# Patient Record
Sex: Female | Born: 1975 | Race: Black or African American | Hispanic: No | Smoking: Former smoker
Health system: Southern US, Community
[De-identification: ages and names within clinical notes are randomized; demographics above are authoritative.]

## PROBLEM LIST (undated history)

## (undated) DIAGNOSIS — D649 Anemia, unspecified: Secondary | ICD-10-CM

## (undated) DIAGNOSIS — I10 Essential (primary) hypertension: Secondary | ICD-10-CM

## (undated) DIAGNOSIS — F419 Anxiety disorder, unspecified: Secondary | ICD-10-CM

## (undated) HISTORY — PX: FRACTURE SURGERY: SHX138

## (undated) HISTORY — DX: Anxiety disorder, unspecified: F41.9

## (undated) HISTORY — PX: ANKLE FRACTURE SURGERY: SHX122

---

## 1998-09-02 ENCOUNTER — Emergency Department (HOSPITAL_COMMUNITY): Admission: EM | Admit: 1998-09-02 | Discharge: 1998-09-02 | Payer: Self-pay | Admitting: Emergency Medicine

## 1998-09-16 ENCOUNTER — Emergency Department (HOSPITAL_COMMUNITY): Admission: EM | Admit: 1998-09-16 | Discharge: 1998-09-16 | Payer: Self-pay | Admitting: Emergency Medicine

## 1999-04-10 ENCOUNTER — Emergency Department (HOSPITAL_COMMUNITY): Admission: EM | Admit: 1999-04-10 | Discharge: 1999-04-10 | Payer: Self-pay | Admitting: Emergency Medicine

## 2000-04-11 ENCOUNTER — Emergency Department (HOSPITAL_COMMUNITY): Admission: EM | Admit: 2000-04-11 | Discharge: 2000-04-11 | Payer: Self-pay | Admitting: Emergency Medicine

## 2000-05-05 ENCOUNTER — Emergency Department (HOSPITAL_COMMUNITY): Admission: EM | Admit: 2000-05-05 | Discharge: 2000-05-05 | Payer: Self-pay | Admitting: *Deleted

## 2002-04-22 ENCOUNTER — Emergency Department (HOSPITAL_COMMUNITY): Admission: EM | Admit: 2002-04-22 | Discharge: 2002-04-22 | Payer: Self-pay | Admitting: Emergency Medicine

## 2002-11-02 ENCOUNTER — Encounter: Payer: Self-pay | Admitting: Specialist

## 2002-11-02 ENCOUNTER — Inpatient Hospital Stay (HOSPITAL_COMMUNITY): Admission: EM | Admit: 2002-11-02 | Discharge: 2002-11-05 | Payer: Self-pay | Admitting: Emergency Medicine

## 2002-11-02 ENCOUNTER — Encounter: Payer: Self-pay | Admitting: Emergency Medicine

## 2002-12-21 ENCOUNTER — Encounter: Admission: RE | Admit: 2002-12-21 | Discharge: 2003-01-11 | Payer: Self-pay | Admitting: *Deleted

## 2003-05-05 ENCOUNTER — Emergency Department (HOSPITAL_COMMUNITY): Admission: EM | Admit: 2003-05-05 | Discharge: 2003-05-06 | Payer: Self-pay | Admitting: Emergency Medicine

## 2003-05-06 ENCOUNTER — Encounter: Payer: Self-pay | Admitting: Emergency Medicine

## 2003-05-12 ENCOUNTER — Emergency Department (HOSPITAL_COMMUNITY): Admission: EM | Admit: 2003-05-12 | Discharge: 2003-05-12 | Payer: Self-pay

## 2003-08-23 ENCOUNTER — Emergency Department (HOSPITAL_COMMUNITY): Admission: EM | Admit: 2003-08-23 | Discharge: 2003-08-24 | Payer: Self-pay | Admitting: Emergency Medicine

## 2004-08-10 ENCOUNTER — Encounter: Admission: RE | Admit: 2004-08-10 | Discharge: 2004-08-10 | Payer: Self-pay | Admitting: Family Medicine

## 2006-04-14 ENCOUNTER — Emergency Department (HOSPITAL_COMMUNITY): Admission: EM | Admit: 2006-04-14 | Discharge: 2006-04-14 | Payer: Self-pay | Admitting: Emergency Medicine

## 2009-06-24 ENCOUNTER — Emergency Department (HOSPITAL_COMMUNITY): Admission: EM | Admit: 2009-06-24 | Discharge: 2009-06-25 | Payer: Self-pay | Admitting: Emergency Medicine

## 2009-09-28 ENCOUNTER — Encounter (HOSPITAL_COMMUNITY): Admission: RE | Admit: 2009-09-28 | Discharge: 2009-12-07 | Payer: Self-pay | Admitting: Internal Medicine

## 2009-10-16 ENCOUNTER — Encounter: Admission: RE | Admit: 2009-10-16 | Discharge: 2009-10-16 | Payer: Self-pay | Admitting: Cardiovascular Disease

## 2010-11-24 ENCOUNTER — Emergency Department (HOSPITAL_COMMUNITY)
Admission: EM | Admit: 2010-11-24 | Discharge: 2010-11-24 | Payer: Self-pay | Source: Home / Self Care | Admitting: Emergency Medicine

## 2011-03-17 LAB — POCT CARDIAC MARKERS
CKMB, poc: 1.5 ng/mL (ref 1.0–8.0)
CKMB, poc: <1 ng/mL — ABNORMAL LOW (ref 1.0–8.0)
Myoglobin, poc: 55.8 ng/mL (ref 12–200)
Troponin i, poc: <0.05 ng/mL (ref 0.00–0.09)

## 2011-03-17 LAB — CBC
MCV: 79 fL (ref 78.0–100.0)
RBC: 4.42 MIL/uL (ref 3.87–5.11)

## 2011-03-17 LAB — RAPID URINE DRUG SCREEN, HOSP PERFORMED
Amphetamines: NOT DETECTED
Barbiturates: NOT DETECTED
Cocaine: NOT DETECTED
Opiates: NOT DETECTED
Tetrahydrocannabinol: NOT DETECTED

## 2011-03-17 LAB — POCT I-STAT, CHEM 8
Calcium, Ion: 1.2 mmol/L (ref 1.12–1.32)
Chloride: 104 mEq/L (ref 96–112)
Glucose, Bld: 108 mg/dL — ABNORMAL HIGH (ref 70–99)
HCT: 36 % (ref 36.0–46.0)
Potassium: 3.3 mEq/L — ABNORMAL LOW (ref 3.5–5.1)
Sodium: 138 mEq/L (ref 135–145)
TCO2: 23 mmol/L (ref 0–100)

## 2011-03-17 LAB — DIFFERENTIAL
Lymphocytes Relative: 40 % (ref 12–46)
Lymphs Abs: 2.4 10*3/uL (ref 0.7–4.0)
Neutro Abs: 3 10*3/uL (ref 1.7–7.7)
Neutrophils Relative %: 49 % (ref 43–77)

## 2011-03-17 LAB — D-DIMER, QUANTITATIVE: D-Dimer, Quant: 0.92 ug/mL-FEU — ABNORMAL HIGH (ref 0.00–0.48)

## 2011-03-17 LAB — POCT PREGNANCY, URINE: Preg Test, Ur: NEGATIVE

## 2011-04-26 NOTE — Discharge Summary (Signed)
NAME:  Anna [name], Anna [name]                           ACCOUNT NO.:  [id number]   MEDICAL RECORD NO.:  [id number]                   PATIENT TYPE:  INP   LOCATION:  0443                                 FACILITY:  [facility]   PHYSICIAN:  Erasmo Leventhal, M.D.         DATE OF BIRTH:  [birth year]   DATE OF ADMISSION:  11/02/2002  DATE OF DISCHARGE:  11/05/2002                                 DISCHARGE SUMMARY   ADMISSION DIAGNOSIS:  Right ankle fracture lateral malleolus with minimal  displacement of the mortise.   DISCHARGE DIAGNOSIS:  Right ankle fracture lateral malleolus with minimal  displacement of the mortise, status post open reduction and internal  fixation of right ankle.   PROCEDURES:  The patient was taken to the operating room on November 02, 2002, and underwent ORIF of her right ankle Weber B displaced lateral  malleolus fracture.  Surgeon was Dr. Benny Lennert.  Assistant was Jenne Campus, P.A.-C.  Surgery was done under general anesthesia.   CONSULTATIONS:  1. Physical therapy.  2. Social work.  3. Case management.   LABORATORY DATA:  Urine pregnancy test was negative.   Preoperative x-rays of the head, CT of the head revealed scalp hematoma  right temporal region.  No intracranial mass or hemorrhage.  No skull  fracture or foreign body.  Preoperative x-rays of the right ankle revealed  good postreduction casting position right ankle.   There is no EKG on this chart.   HOSPITAL COURSE:  The patient was admitted to [facility] on  November 02, 2002, and taken to the operating room.  She underwent the above-  stated procedure without complications.  The patient was allowed to return  to the recovery room and then the orthopedic floor for continued  postoperative care.  On November 03, 2002, seen by orthopedics.  The patient  was complaining of pain.  Pain not adequately controlled by p.o. medicines.  PC was held today.  Seen by PT.  The patient was  touchdown weightbearing.  On November 04, 2002, the patient was seen by orthopedics.  Still  complaining of ankle pain.  Splint intact.  Neurovascularly intact.  Discharge was held at this time due to no one being at home able to help  her.  On November 05, 2002, postoperative day #3, the patient was resting  comfortably and was ready for discharge.  The patient was discharged home.   DISPOSITION:  Discharged home on November 05, 2002.   DISCHARGE MEDICATIONS:  1. Percocet 5/325 #50, 1-2 p.o. q.4-6h. p.r.n.  2. Robaxin 500 mg #50, 1 p.o. q.6-8h. p.r.n.  3. Aspirin 325 mg 1 p.o. q.d.   DIET:  As tolerated.   ACTIVITY:  Touchdown weightbearing to right lower extremity.   FOLLOW-UP:  The patient is to follow up with Dr. Thomasena Edis two weeks from the  day of surgery.  She is to call for  an appointment.   CONDITION ON DISCHARGE:  Stable and improved.     Clarene Reamer, P.A.-C.                   Erasmo Leventhal, M.D.    SW/MEDQ  D:  11/12/2002  T:  11/12/2002  Job:  [id number]
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
## 2011-12-18 ENCOUNTER — Encounter (HOSPITAL_COMMUNITY): Payer: Self-pay | Admitting: Emergency Medicine

## 2011-12-18 ENCOUNTER — Emergency Department (HOSPITAL_COMMUNITY)
Admission: EM | Admit: 2011-12-18 | Discharge: 2011-12-18 | Disposition: A | Discharge status: Home Health | Payer: Self-pay | Attending: Emergency Medicine | Admitting: Emergency Medicine

## 2011-12-18 DIAGNOSIS — T783XXA Angioneurotic edema, initial encounter: Secondary | ICD-10-CM | POA: Insufficient documentation

## 2011-12-18 DIAGNOSIS — X58XXXA Exposure to other specified factors, initial encounter: Secondary | ICD-10-CM | POA: Insufficient documentation

## 2011-12-18 DIAGNOSIS — I10 Essential (primary) hypertension: Secondary | ICD-10-CM | POA: Insufficient documentation

## 2011-12-18 DIAGNOSIS — R221 Localized swelling, mass and lump, neck: Secondary | ICD-10-CM | POA: Insufficient documentation

## 2011-12-18 DIAGNOSIS — R220 Localized swelling, mass and lump, head: Secondary | ICD-10-CM | POA: Insufficient documentation

## 2011-12-18 HISTORY — DX: Essential (primary) hypertension: I10

## 2011-12-18 MED ORDER — PREDNISONE 10 MG PO TABS: 20.0000 mg | ORAL_TABLET | Freq: Every day | ORAL | Status: DC

## 2011-12-18 MED ORDER — DIPHENHYDRAMINE HCL 25 MG PO CAPS
25.0000 mg | ORAL_CAPSULE | Freq: Once | ORAL | Status: AC
  Administered 2011-12-18: 25 mg via ORAL
  Filled 2011-12-18: qty 1

## 2011-12-18 MED ORDER — DIPHENHYDRAMINE HCL 25 MG PO TABS: 50.0000 mg | ORAL_TABLET | Freq: Three times a day (TID) | ORAL | Status: DC | PRN

## 2011-12-18 NOTE — ED Notes (Signed)
Pt states her top lip started to swell about  4 am  States had an icecream sandwich and she had a nut , in the past nuts have made her mouth itch, unk any other reason for swelling pt has no diff breathing or speaking nad

## 2011-12-18 NOTE — ED Provider Notes (Signed)
History     CSN: [id number]  Arrival date & time 12/18/11  1110   First MD Initiated Contact with Patient 12/18/11 1202      Chief Complaint  Patient presents with  . Oral Swelling    (Consider location/radiation/quality/duration/timing/severity/associated sxs/prior treatment) HPI Patient presents to the emergency room with complaints of lip swelling. Patient states she noticed a little bit of irritation yesterday and it got better. Patient woke up this morning and noticed that her upper lip was swollen. She denies any trouble with her breathing or speaking. She denies any rash or urticaria. Patient states she did have an ice cream sandwich yesterday think she had a nut but did not notice an immediate reaction after that. Past Medical History  Diagnosis Date  . Hypertension     No past surgical history on file.  No family history on file.  History  Substance Use Topics  . Smoking status: Current Everyday Smoker  . Smokeless tobacco: Not on file  . Alcohol Use: No    OB History    Grav Para Term Preterm Abortions TAB SAB Ect Mult Living                  Review of Systems  All other systems reviewed and are negative.    Allergies  Latex and Peanut-containing drug products  Home Medications   Current Outpatient Rx  Name Route Sig Dispense Refill  . LISINOPRIL 20 MG PO TABS Oral Take 20 mg by mouth daily.    Marland Kitchen METOPROLOL TARTRATE 50 MG PO TABS Oral Take 75 mg by mouth 2 (two) times daily.    Carma Leaven M PLUS PO TABS Oral Take 1 tablet by mouth daily.      BP 130/86  Pulse 73  Temp(Src) 98.3 F (36.8 C) (Oral)  Resp 16  SpO2 100%  Physical Exam  Nursing note and vitals reviewed. Constitutional: She appears well-developed and well-nourished. No distress.  HENT:  Head: Normocephalic and atraumatic.  Right Ear: External ear normal.  Left Ear: External ear normal.  Mouth/Throat: Oropharynx is clear and moist. No oral lesions. Normal dentition. No uvula  swelling or dental caries. No oropharyngeal exudate or posterior oropharyngeal edema.       No tongue edema, edema noted of the upper lip  Eyes: Conjunctivae are normal. Right eye exhibits no discharge. Left eye exhibits no discharge. No scleral icterus.  Neck: Neck supple. No tracheal deviation present.  Cardiovascular: Normal rate.   Pulmonary/Chest: Effort normal. No stridor. No respiratory distress.  Musculoskeletal: She exhibits no edema.  Neurological: She is alert. Cranial nerve deficit: no gross deficits.  Skin: Skin is warm and dry. No rash noted. Rash is not urticarial.  Psychiatric: She has a normal mood and affect.    ED Course  Procedures (including critical care time)  Labs Reviewed - No data to display No results found.   Diagnosis: Angioedema   MDM  The patient's symptoms are suggestive of angioedema associated with her lisinopril. Patient's symptoms started gradually and she noticed it primarily this morning. There is no clear relation to any food ingestion. There is no rash. She is not itching. 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 I will have her stop her lisinopril and have her speak to her primary doctor about the different blood pressure medication because of the fact that she thought she ate some nuts yesterday I will prescribe her antihistamines and  steroids although it will not likely help if this is from the lisinopril.        Celene Kras, MD 12/18/11 [phone]

## 2012-01-12 ENCOUNTER — Encounter (HOSPITAL_COMMUNITY): Payer: Self-pay | Admitting: *Deleted

## 2012-01-12 ENCOUNTER — Emergency Department (HOSPITAL_COMMUNITY): Payer: Self-pay

## 2012-01-12 ENCOUNTER — Emergency Department (HOSPITAL_COMMUNITY)
Admission: EM | Admit: 2012-01-12 | Discharge: 2012-01-12 | Disposition: A | Discharge status: Home / Self Care | Payer: Self-pay | Attending: Emergency Medicine | Admitting: Emergency Medicine

## 2012-01-12 DIAGNOSIS — S93409A Sprain of unspecified ligament of unspecified ankle, initial encounter: Secondary | ICD-10-CM | POA: Insufficient documentation

## 2012-01-12 DIAGNOSIS — Z981 Arthrodesis status: Secondary | ICD-10-CM | POA: Insufficient documentation

## 2012-01-12 DIAGNOSIS — Y9229 Other specified public building as the place of occurrence of the external cause: Secondary | ICD-10-CM | POA: Insufficient documentation

## 2012-01-12 DIAGNOSIS — S93401A Sprain of unspecified ligament of right ankle, initial encounter: Secondary | ICD-10-CM

## 2012-01-12 DIAGNOSIS — I10 Essential (primary) hypertension: Secondary | ICD-10-CM | POA: Insufficient documentation

## 2012-01-12 DIAGNOSIS — M79609 Pain in unspecified limb: Secondary | ICD-10-CM | POA: Insufficient documentation

## 2012-01-12 DIAGNOSIS — W010XXA Fall on same level from slipping, tripping and stumbling without subsequent striking against object, initial encounter: Secondary | ICD-10-CM | POA: Insufficient documentation

## 2012-01-12 MED ORDER — IBUPROFEN 800 MG PO TABS
ORAL_TABLET | ORAL | Status: AC
  Administered 2012-01-12: 800 mg via ORAL
  Filled 2012-01-12: qty 1

## 2012-01-12 MED ORDER — IBUPROFEN 800 MG PO TABS: 800.0000 mg | ORAL_TABLET | Freq: Once | ORAL | Status: DC

## 2012-01-12 MED ORDER — TRAMADOL HCL 50 MG PO TABS: 50.0000 mg | ORAL_TABLET | Freq: Four times a day (QID) | ORAL | Status: AC | PRN

## 2012-01-12 NOTE — ED Provider Notes (Signed)
History     CSN: [id number]  Arrival date & time 01/12/12  0865   First MD Initiated Contact with Patient 01/12/12 [phone]      Chief Complaint  Patient presents with  . Leg Pain  . Fall    (Consider location/radiation/quality/duration/timing/severity/associated sxs/prior treatment) Patient is a 36 y.o. female presenting with leg pain and fall. The history is provided by the patient.  Leg Pain  The incident occurred yesterday.  Fall  pt reports she was walking at the store yesterday and fell after slipping on an open sugar bag. Fell onto both knees with reported twisting of right ankle/foot. Pain aching/throbbing, moderate in severity, non-radiating, constant in right ankle. Reports swelling to the ankle. Not able to bear weight immediately but has been able to bear weight since several hours after the injury. Denies abrasion, laceration, numbness, weakness. Has been taking tylenol PM at home without relief of pain. Relates a hx of surgery to same ankle for fx in 1999.   Past Medical History  Diagnosis Date  . Hypertension     Past Surgical History  Procedure Date  . Fracture surgery right ankle    No family history on file.  History  Substance Use Topics  . Smoking status: Current Everyday Smoker  . Smokeless tobacco: Not on file  . Alcohol Use: No     Review of Systems 10 systems reviewed and are negative for acute change except as noted in the HPI.  Allergies  Latex and Peanut-containing drug products  Home Medications   Current Outpatient Rx  Name Route Sig Dispense Refill  . DIPHENHYDRAMINE HCL 25 MG PO TABS Oral Take 2 tablets (50 mg total) by mouth every 8 (eight) hours as needed for itching. 21 tablet 0  . METOPROLOL TARTRATE 50 MG PO TABS Oral Take 75 mg by mouth 2 (two) times daily.    Carma Leaven M PLUS PO TABS Oral Take 1 tablet by mouth daily.    Marland Kitchen PREDNISONE 10 MG PO TABS Oral Take 2 tablets (20 mg total) by mouth daily. 15 tablet 0    BP 134/84   Pulse 88  Temp(Src) 97.7 F (36.5 C) (Oral)  Resp 16  SpO2 100%  Physical Exam  Nursing note and vitals reviewed. Constitutional: She appears well-developed and well-nourished. No distress.  HENT:  Head: Normocephalic and atraumatic.  Right Ear: External ear normal.  Left Ear: External ear normal.  Mouth/Throat: Oropharynx is clear and moist.  Eyes: Pupils are equal, round, and reactive to light.  Neck: Normal range of motion. Neck supple.  Cardiovascular: Normal rate, regular rhythm and intact distal pulses.   Pulmonary/Chest: Effort normal. No respiratory distress.  Abdominal: Soft. She exhibits no distension. There is no tenderness.  Musculoskeletal: She exhibits edema and tenderness.       Right ankle: She exhibits decreased range of motion and swelling. She exhibits no ecchymosis, no deformity, no laceration and normal pulse. tenderness. AITFL tenderness found. No lateral malleolus, no medial malleolus, no head of 5th metatarsal and no proximal fibula tenderness found. Achilles tendon normal.       Feet:  Neurological: She is alert. No cranial nerve deficit.       Sensation intact to light touch  Skin: Skin is warm and dry. No rash noted. No erythema.  Psychiatric: She has a normal mood and affect.    ED Course  Procedures (including critical care time)  Labs Reviewed - No data to display Dg Ankle Complete Right  01/12/2012  *RADIOLOGY REPORT*  Clinical Data: Right ankle pain, twisting injury  RIGHT ANKLE - COMPLETE 3+ VIEW  Comparison: None.  Findings: Evidence of prior remote distal fibular side plate and screw fixation noted.  One of the screws does not traverse the plate but there is no surrounding abnormal lucency or fracture line visualized.  Evidence of prior remote to medial malleolar avulsion fracture.  The mortise is symmetric.  Minimal plantar calcaneal spurring noted.  IMPRESSION: No acute abnormality or evidence for hardware failure.  Original Report Authenticated  By: Harrel Lemon, M.D.     Dx 1: Ankle sprain, right   MDM  X-ray reviewed, no evidence of fx or dislocation. Exam c/w ankle sprain. Will d/c home with ASO, crutches, ultram pain medication.        Shaaron Adler, PA-C 01/12/12 1004

## 2012-01-12 NOTE — ED Notes (Signed)
Patient was at [facility], slipped on an open bag of sugar. Landed on knees with right foot twisting. Yesterday pain right lateral lower leg now radiating toward lateral ankle. Ambulatory.

## 2012-01-13 NOTE — ED Provider Notes (Signed)
Medical screening examination/treatment/procedure(s) were performed by non-physician practitioner and as supervising physician I was immediately available for consultation/collaboration.  Gerhard Munch, MD 01/13/12 1940

## 2014-03-14 ENCOUNTER — Other Ambulatory Visit: Payer: Self-pay

## 2016-03-09 ENCOUNTER — Emergency Department (HOSPITAL_COMMUNITY): Payer: BLUE CROSS/BLUE SHIELD

## 2016-03-09 ENCOUNTER — Emergency Department (HOSPITAL_COMMUNITY)
Admission: EM | Admit: 2016-03-09 | Discharge: 2016-03-09 | Discharge status: Absent without leave | Payer: Medicaid Other

## 2016-03-09 ENCOUNTER — Encounter (HOSPITAL_COMMUNITY): Payer: Self-pay | Admitting: *Deleted

## 2016-03-09 ENCOUNTER — Emergency Department (HOSPITAL_COMMUNITY)
Admission: EM | Admit: 2016-03-09 | Discharge: 2016-03-09 | Disposition: A | Discharge status: Home / Self Care | Payer: BLUE CROSS/BLUE SHIELD | Attending: Emergency Medicine | Admitting: Emergency Medicine

## 2016-03-09 DIAGNOSIS — F419 Anxiety disorder, unspecified: Secondary | ICD-10-CM | POA: Diagnosis not present

## 2016-03-09 DIAGNOSIS — F1490 Cocaine use, unspecified, uncomplicated: Secondary | ICD-10-CM | POA: Insufficient documentation

## 2016-03-09 DIAGNOSIS — R0789 Other chest pain: Secondary | ICD-10-CM | POA: Insufficient documentation

## 2016-03-09 DIAGNOSIS — R079 Chest pain, unspecified: Secondary | ICD-10-CM | POA: Diagnosis present

## 2016-03-09 DIAGNOSIS — R0602 Shortness of breath: Secondary | ICD-10-CM | POA: Insufficient documentation

## 2016-03-09 LAB — CBC
HCT: 39.5 % (ref 36.0–46.0)
HEMOGLOBIN: 13.4 g/dL (ref 12.0–15.0)
MCH: 27.2 pg (ref 26.0–34.0)
MCHC: 33.9 g/dL (ref 30.0–36.0)
MCV: 80.3 fL (ref 78.0–100.0)
PLATELETS: 344 10*3/uL (ref 150–400)
RBC: 4.92 MIL/uL (ref 3.87–5.11)
RDW: 15.2 % (ref 11.5–15.5)
WBC: 7.6 10*3/uL (ref 4.0–10.5)

## 2016-03-09 LAB — BASIC METABOLIC PANEL
ANION GAP: 8 (ref 5–15)
BUN: 7 mg/dL (ref 6–20)
CHLORIDE: 107 mmol/L (ref 101–111)
CO2: 25 mmol/L (ref 22–32)
Calcium: 8.8 mg/dL — ABNORMAL LOW (ref 8.9–10.3)
Creatinine, Ser: 0.74 mg/dL (ref 0.44–1.00)
GFR calc Af Amer: >60 mL/min (ref >60)
Glucose, Bld: 86 mg/dL (ref 65–99)
POTASSIUM: 3.7 mmol/L (ref 3.5–5.1)
SODIUM: 140 mmol/L (ref 135–145)

## 2016-03-09 LAB — I-STAT TROPONIN, ED: TROPONIN I, POC: 0 ng/mL (ref 0.00–0.08)

## 2016-03-09 NOTE — ED Notes (Signed)
Bed: WA03 Expected date:  Expected time:  Means of arrival:  Comments: 

## 2016-03-09 NOTE — ED Provider Notes (Signed)
CSN: [id number]     Arrival date & time 03/09/16  0459 History   First MD Initiated Contact with Patient 03/09/16 0522     No chief complaint on file.    (Consider location/radiation/quality/duration/timing/severity/associated sxs/prior Treatment) HPI  Pt presenting with c/o feeling anxious, chest pain and shortness of breath after snorting cocaine seveal hours prior to arrival.  She currently states she has no chest pain, but continues to "feel weird".  She states she has never used cocaine before- and did this tonight because she felt lonely.  Pt states she is under a lot of stress but denies sucidal ideation or intent to harm herself or anyone else.  She denies using other substances.  She has not had any treatment prior to arrival.  There are no other associated systemic symptoms, there are no other alleviating or modifying factors.   History reviewed. No pertinent past medical history. History reviewed. No pertinent past surgical history. No family history on file. Social History  Substance Use Topics  . Smoking status: Never Smoker   . Smokeless tobacco: None  . Alcohol Use: Yes     Comment: occassionally, not often   OB History    No data available     Review of Systems  ROS reviewed and all otherwise negative except for mentioned in HPI    Allergies  Review of patient's allergies indicates no known allergies.  Home Medications   Prior to Admission medications   Not on File   BP 137/76 mmHg  Pulse 88  Temp(Src) 98.3 F (36.8 C) (Oral)  Resp 19  SpO2 100%  LMP 03/06/2016  Vitals reviewed Physical Exam  Physical Examination: General appearance - alert, well appearing, and in no distress Mental status - alert, oriented to person, place, and time Eyes - no conjunctival injection, no scleral icterus Mouth - mucous membranes moist, pharynx normal without lesions Chest - clear to auscultation, no wheezes, rales or rhonchi, symmetric air entry Heart - normal rate,  regular rhythm, normal S1, S2, no murmurs, rubs, clicks or gallops Abdomen - soft, nontender, nondistended, no masses or organomegaly Neurological - alert, oriented, normal speech Extremities - peripheral pulses normal, no pedal edema, no clubbing or cyanosis Skin - normal coloration and turgor, no rashes Psych- calm cooperative, normal mood and affect  ED Course  Procedures (including critical care time) Labs Review Labs Reviewed  BASIC METABOLIC PANEL - Abnormal; Notable for the following:    Calcium 8.8 (*)    All other components within normal limits  CBC  I-STAT TROPOININ, ED    Imaging Review Dg Chest 2 View  03/09/2016  CLINICAL DATA:  Acute onset of mid chest pain after trying cocaine. Initial encounter. EXAM: CHEST  2 VIEW COMPARISON:  None. FINDINGS: The lungs are well-aerated and clear. There is no evidence of focal opacification, pleural effusion or pneumothorax. The heart is normal in size; the mediastinal contour is within normal limits. No acute osseous abnormalities are seen. IMPRESSION: No acute cardiopulmonary process seen. Electronically Signed   By: Garald Balding M.D.   On: 03/09/2016 05:36   I have personally reviewed and evaluated these images and lab results as part of my medical decision-making.   EKG Interpretation   Date/Time:  Saturday March 09 2016 05:09:37 EDT Ventricular Rate:  71 PR Interval:  180 QRS Duration: 128 QT Interval:  405 QTC Calculation: 440 R Axis:   57 Text Interpretation:  Sinus rhythm Nonspecific intraventricular conduction  delay ST elev, probable normal  early repol pattern No old tracing to  compare Confirmed by [facility]  MD, [place] [phone]) on 03/09/2016 5:31:28 AM      MDM   Final diagnoses:  Chest discomfort  Cocaine use    Pt presenting with c/o chest pain, shortness of breath and anxiety after using cocaine tonight.  She is currently feeling improved.  EKG is normal , CXR is reassuring and labs are reassuring.  Pt advised  against using cocaine in the future.  Discharged with strict return precautions.  Pt agreeable with plan.    Alfonzo Beers, MD 03/09/16 [phone]
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
## 2016-03-09 NOTE — ED Notes (Signed)
Patient transported to X-ray 

## 2016-03-09 NOTE — Discharge Instructions (Signed)
Return to the ED with any concerns including difficulty breathing, fainting, vomiting and not able to keep down liquids, decreased level of alertness/lethargy, or any other alarming symptoms

## 2016-04-17 ENCOUNTER — Encounter (HOSPITAL_COMMUNITY): Payer: Self-pay | Admitting: *Deleted

## 2019-03-26 DIAGNOSIS — N920 Excessive and frequent menstruation with regular cycle: Secondary | ICD-10-CM | POA: Insufficient documentation

## 2019-03-26 DIAGNOSIS — F1410 Cocaine abuse, uncomplicated: Secondary | ICD-10-CM | POA: Insufficient documentation

## 2019-03-29 DIAGNOSIS — F1994 Other psychoactive substance use, unspecified with psychoactive substance-induced mood disorder: Secondary | ICD-10-CM | POA: Insufficient documentation

## 2020-03-21 DIAGNOSIS — N939 Abnormal uterine and vaginal bleeding, unspecified: Secondary | ICD-10-CM | POA: Diagnosis present

## 2020-03-21 DIAGNOSIS — F32A Depression, unspecified: Secondary | ICD-10-CM | POA: Insufficient documentation

## 2020-05-22 ENCOUNTER — Encounter (HOSPITAL_COMMUNITY): Payer: Self-pay

## 2020-05-22 ENCOUNTER — Emergency Department (HOSPITAL_COMMUNITY)
Admission: EM | Admit: 2020-05-22 | Discharge: 2020-05-22 | Disposition: A | Discharge status: Home / Self Care | Payer: BLUE CROSS/BLUE SHIELD | Attending: Emergency Medicine | Admitting: Emergency Medicine

## 2020-05-22 ENCOUNTER — Other Ambulatory Visit: Payer: Self-pay

## 2020-05-22 DIAGNOSIS — R1030 Lower abdominal pain, unspecified: Secondary | ICD-10-CM | POA: Insufficient documentation

## 2020-05-22 DIAGNOSIS — F17210 Nicotine dependence, cigarettes, uncomplicated: Secondary | ICD-10-CM | POA: Insufficient documentation

## 2020-05-22 DIAGNOSIS — I10 Essential (primary) hypertension: Secondary | ICD-10-CM | POA: Insufficient documentation

## 2020-05-22 DIAGNOSIS — R109 Unspecified abdominal pain: Secondary | ICD-10-CM

## 2020-05-22 LAB — COMPREHENSIVE METABOLIC PANEL
ALT: 14 U/L (ref 0–44)
AST: 25 U/L (ref 15–41)
Albumin: 4.3 g/dL (ref 3.5–5.0)
Alkaline Phosphatase: 64 U/L (ref 38–126)
Anion gap: 15 (ref 5–15)
BUN: 11 mg/dL (ref 6–20)
CO2: 24 mmol/L (ref 22–32)
Calcium: 9.4 mg/dL (ref 8.9–10.3)
Chloride: 102 mmol/L (ref 98–111)
Creatinine, Ser: 0.68 mg/dL (ref 0.44–1.00)
GFR calc Af Amer: >60 mL/min (ref >60)
GFR calc non Af Amer: >60 mL/min (ref >60)
Glucose, Bld: 112 mg/dL — ABNORMAL HIGH (ref 70–99)
Potassium: 3.4 mmol/L — ABNORMAL LOW (ref 3.5–5.1)
Sodium: 141 mmol/L (ref 135–145)
Total Bilirubin: 0.8 mg/dL (ref 0.3–1.2)
Total Protein: 8.5 g/dL — ABNORMAL HIGH (ref 6.5–8.1)

## 2020-05-22 LAB — CBC
HCT: 37.8 % (ref 36.0–46.0)
Hemoglobin: 11.8 g/dL — ABNORMAL LOW (ref 12.0–15.0)
MCH: 24.8 pg — ABNORMAL LOW (ref 26.0–34.0)
MCHC: 31.2 g/dL (ref 30.0–36.0)
MCV: 79.6 fL — ABNORMAL LOW (ref 80.0–100.0)
Platelets: 367 10*3/uL (ref 150–400)
RBC: 4.75 MIL/uL (ref 3.87–5.11)
RDW: 21.2 % — ABNORMAL HIGH (ref 11.5–15.5)
WBC: 13.8 10*3/uL — ABNORMAL HIGH (ref 4.0–10.5)
nRBC: 0 % (ref 0.0–0.2)

## 2020-05-22 LAB — LIPASE, BLOOD: Lipase: 18 U/L (ref 11–51)

## 2020-05-22 LAB — I-STAT BETA HCG BLOOD, ED (MC, WL, AP ONLY): I-stat hCG, quantitative: <5 m[IU]/mL (ref <5)

## 2020-05-22 MED ORDER — KETOROLAC TROMETHAMINE 15 MG/ML IJ SOLN
15.0000 mg | Freq: Once | INTRAMUSCULAR | Status: AC
  Administered 2020-05-22: 15 mg via INTRAVENOUS
  Filled 2020-05-22: qty 1

## 2020-05-22 MED ORDER — SODIUM CHLORIDE 0.9% FLUSH
3.0000 mL | Freq: Once | INTRAVENOUS | Status: AC
  Administered 2020-05-22: 3 mL via INTRAVENOUS

## 2020-05-22 NOTE — ED Notes (Signed)
Patient aware that we need urine sample for testing, unable at this time. Pt given instruction on providing urine sample when able to do so.   

## 2020-05-22 NOTE — ED Provider Notes (Signed)
[facility] DEPT Provider Note   CSN: [id number] Arrival date & time: 05/22/20  1424     History Chief Complaint  Patient presents with  . Abdominal Pain  . Emesis  . Vaginal Bleeding    Anna [name] is a 44 y.o. female.  44 year old female with prior medical history as detailed below presents for evaluation of abdominal cramping discomfort.  Patient reports prior history of uterine fibroids.  Patient reports intermittent abdominal cramping secondary to her history of uterine fibroids.  She reports that this pain is chronic in nature.  She reports increased discomfort over the last 24 to 48 hours.  She not take anything at home.  She also reports irregular vaginal bleeding which is also not an acute problem.  She has not seen a GYN recently.  She denies fever.  She denies vomiting.  She denies bowel movement change.  She denies urinary symptoms.  The history is provided by the patient and medical records.  Abdominal Pain Pain location:  Suprapubic Pain quality: cramping   Pain radiates to:  Does not radiate Pain severity:  Mild Onset quality:  Gradual Duration:  3 days Timing:  Constant Progression:  Waxing and waning Chronicity:  Chronic Relieved by:  Nothing Worsened by:  Nothing Associated symptoms: vaginal bleeding and vomiting   Emesis Associated symptoms: abdominal pain   Vaginal Bleeding Associated symptoms: abdominal pain        Past Medical History:  Diagnosis Date  . Hypertension     There are no problems to display for this patient.   Past Surgical History:  Procedure Laterality Date  . FRACTURE SURGERY  right ankle     OB History    Gravida  0   Para  0   Term  0   Preterm  0   AB  0   Living        SAB  0   TAB  0   Ectopic  0   Multiple      Live Births              History reviewed. No pertinent family history.  Social History   Tobacco Use  . Smoking status: Current Some Day Smoker    . Smokeless tobacco: Never Used  Vaping Use  . Vaping Use: Never used  Substance Use Topics  . Alcohol use: Yes    Comment: occassionally, not often  . Drug use: Yes    Types: Cocaine    Home Medications Prior to Admission medications   Medication Sig Start Date End Date Taking? Authorizing Provider  metoprolol (LOPRESSOR) 50 MG tablet Take 75 mg by mouth 2 (two) times daily.    [provider]  Multiple Vitamins-Minerals (MULTIVITAMINS THER. W/MINERALS) TABS Take 1 tablet by mouth daily.    [provider]  predniSONE (DELTASONE) 10 MG tablet Take 2 tablets (20 mg total) by mouth daily. 12/18/11   Dorie Rank, MD    Allergies    Latex and Peanut-containing drug products  Review of Systems   Review of Systems  Gastrointestinal: Positive for abdominal pain and vomiting.  Genitourinary: Positive for vaginal bleeding.  All other systems reviewed and are negative.   Physical Exam Updated Vital Signs BP (!) 160/89 (BP Location: Left Arm)   Pulse 70   Temp 97.7 F (36.5 C) (Oral)   Resp 16   Ht 5\' 8"  (1.727 m)   LMP 05/22/2020   SpO2 99%   Physical Exam  Vitals and nursing note reviewed.  Constitutional:      General: She is not in acute distress.    Appearance: She is well-developed.  HENT:     Head: Normocephalic and atraumatic.  Eyes:     Conjunctiva/sclera: Conjunctivae normal.     Pupils: Pupils are equal, round, and reactive to light.  Cardiovascular:     Rate and Rhythm: Normal rate and regular rhythm.     Heart sounds: Normal heart sounds.  Pulmonary:     Effort: Pulmonary effort is normal. No respiratory distress.     Breath sounds: Normal breath sounds.  Abdominal:     General: There is no distension.     Palpations: Abdomen is soft.     Tenderness: There is abdominal tenderness in the suprapubic area.     Comments: Mild suprapubic tenderness   Genitourinary:    Comments: Patient declines pelvic exam  Musculoskeletal:        General:  No deformity. Normal range of motion.     Cervical back: Normal range of motion and neck supple.  Skin:    General: Skin is warm and dry.  Neurological:     Mental Status: She is alert and oriented to person, place, and time.     ED Results / Procedures / Treatments   Labs (all labs ordered are listed, but only abnormal results are displayed) Labs Reviewed  COMPREHENSIVE METABOLIC PANEL - Abnormal; Notable for the following components:      Result Value   Potassium 3.4 (*)    Glucose, Bld 112 (*)    Total Protein 8.5 (*)    All other components within normal limits  CBC - Abnormal; Notable for the following components:   WBC 13.8 (*)    Hemoglobin 11.8 (*)    MCV 79.6 (*)    MCH 24.8 (*)    RDW 21.2 (*)    All other components within normal limits  LIPASE, BLOOD  URINALYSIS, ROUTINE W REFLEX MICROSCOPIC  I-STAT BETA HCG BLOOD, ED ([facility], [facility], [facility] ONLY)    EKG None  Radiology No results found.  Procedures Procedures (including critical care time)  Medications Ordered in ED Medications  sodium chloride flush (NS) 0.9 % injection 3 mL (has no administration in time range)  ketorolac (TORADOL) 15 MG/ML injection 15 mg (has no administration in time range)    ED Course  I have reviewed the triage vital signs and the nursing notes.  Pertinent labs & imaging results that were available during my care of the patient were reviewed by me and considered in my medical decision making (see chart for details).    MDM Rules/Calculators/A&P                          MDM  Screen complete  Anna [name] was evaluated in Emergency Department on 05/22/2020 for the symptoms described in the history of present illness. She was evaluated in the context of the global COVID-19 pandemic, which necessitated consideration that the patient might be at risk for infection with the SARS-CoV-2 virus that causes COVID-19. Institutional protocols and algorithms that pertain to the evaluation of  patients at risk for COVID-19 are in a state of rapid change based on information released by regulatory bodies including the CDC and federal and state organizations. These policies and algorithms were followed during the patient's care in the ED.  Patient is presenting for evaluation of abdominal discomfort.  Patient reports onset  history of uterine fibroids.  Patient's described symptoms today are consistent with prior episodes of fibroid related cramping pain.  The labs obtained in the ED are without significant abnormality.  Patient appears to be significantly improved after administration of Toradol.  Upon my reevaluation she is comfortably sleeping.  She does not have any remaining abdominal discomfort.  Patient is advised to closely follow-up with regular routine primary care.  Strict return precautions given and understood.     Final Clinical Impression(s) / ED Diagnoses Final diagnoses:  Abdominal pain, unspecified abdominal location    Rx / DC Orders ED Discharge Orders    None       Valarie Merino, MD 05/22/20 2114

## 2020-05-22 NOTE — ED Triage Notes (Signed)
Per EMS- patient c/o abdominal pain, N/V, and vaginal bleeding. Patient reports that she has uterine fibroids.

## 2020-05-22 NOTE — Discharge Instructions (Addendum)
Return for any problem.  Follow-up with a regular care provider as instructed.

## 2020-05-22 NOTE — ED Notes (Signed)
Labeled specimen cup provided to pt for urine collection. [employer]

## 2021-04-19 ENCOUNTER — Encounter (HOSPITAL_COMMUNITY): Payer: Self-pay | Admitting: Emergency Medicine

## 2021-04-19 ENCOUNTER — Observation Stay (HOSPITAL_COMMUNITY)
Admission: EM | Admit: 2021-04-19 | Discharge: 2021-04-20 | Disposition: A | Discharge status: Home / Self Care | Payer: 59 | Attending: Internal Medicine | Admitting: Internal Medicine

## 2021-04-19 ENCOUNTER — Emergency Department (HOSPITAL_COMMUNITY): Payer: 59

## 2021-04-19 ENCOUNTER — Other Ambulatory Visit: Payer: Self-pay

## 2021-04-19 ENCOUNTER — Observation Stay (HOSPITAL_COMMUNITY): Payer: 59

## 2021-04-19 DIAGNOSIS — N939 Abnormal uterine and vaginal bleeding, unspecified: Secondary | ICD-10-CM | POA: Diagnosis present

## 2021-04-19 DIAGNOSIS — Z91040 Latex allergy status: Secondary | ICD-10-CM | POA: Diagnosis not present

## 2021-04-19 DIAGNOSIS — D219 Benign neoplasm of connective and other soft tissue, unspecified: Secondary | ICD-10-CM

## 2021-04-19 DIAGNOSIS — I10 Essential (primary) hypertension: Secondary | ICD-10-CM | POA: Insufficient documentation

## 2021-04-19 DIAGNOSIS — Z91010 Allergy to peanuts: Secondary | ICD-10-CM | POA: Insufficient documentation

## 2021-04-19 DIAGNOSIS — F17210 Nicotine dependence, cigarettes, uncomplicated: Secondary | ICD-10-CM | POA: Insufficient documentation

## 2021-04-19 DIAGNOSIS — R3129 Other microscopic hematuria: Secondary | ICD-10-CM | POA: Diagnosis present

## 2021-04-19 DIAGNOSIS — D649 Anemia, unspecified: Secondary | ICD-10-CM | POA: Diagnosis present

## 2021-04-19 DIAGNOSIS — D259 Leiomyoma of uterus, unspecified: Secondary | ICD-10-CM | POA: Diagnosis not present

## 2021-04-19 DIAGNOSIS — E876 Hypokalemia: Secondary | ICD-10-CM | POA: Diagnosis not present

## 2021-04-19 DIAGNOSIS — R21 Rash and other nonspecific skin eruption: Secondary | ICD-10-CM

## 2021-04-19 DIAGNOSIS — N858 Other specified noninflammatory disorders of uterus: Secondary | ICD-10-CM | POA: Diagnosis present

## 2021-04-19 DIAGNOSIS — R109 Unspecified abdominal pain: Secondary | ICD-10-CM

## 2021-04-19 DIAGNOSIS — Z20822 Contact with and (suspected) exposure to covid-19: Secondary | ICD-10-CM | POA: Insufficient documentation

## 2021-04-19 LAB — URINALYSIS, ROUTINE W REFLEX MICROSCOPIC
Bilirubin Urine: NEGATIVE
Glucose, UA: NEGATIVE mg/dL
Ketones, ur: NEGATIVE mg/dL
Leukocytes,Ua: NEGATIVE
Nitrite: NEGATIVE
Protein, ur: 30 mg/dL — AB
Specific Gravity, Urine: 1.011 (ref 1.005–1.030)
pH: 5 (ref 5.0–8.0)

## 2021-04-19 LAB — BASIC METABOLIC PANEL
Anion gap: 9 (ref 5–15)
BUN: 7 mg/dL (ref 6–20)
CO2: 25 mmol/L (ref 22–32)
Calcium: 8.9 mg/dL (ref 8.9–10.3)
Chloride: 102 mmol/L (ref 98–111)
Creatinine, Ser: 0.93 mg/dL (ref 0.44–1.00)
GFR, Estimated: >60 mL/min (ref >60)
Glucose, Bld: 101 mg/dL — ABNORMAL HIGH (ref 70–99)
Potassium: 3 mmol/L — ABNORMAL LOW (ref 3.5–5.1)
Sodium: 136 mmol/L (ref 135–145)

## 2021-04-19 LAB — CBC
HCT: 18.1 % — ABNORMAL LOW (ref 36.0–46.0)
Hemoglobin: 4.8 g/dL — CL (ref 12.0–15.0)
MCH: 16.4 pg — ABNORMAL LOW (ref 26.0–34.0)
MCHC: 26.5 g/dL — ABNORMAL LOW (ref 30.0–36.0)
MCV: 61.8 fL — ABNORMAL LOW (ref 80.0–100.0)
Platelets: 480 10*3/uL — ABNORMAL HIGH (ref 150–400)
RBC: 2.93 MIL/uL — ABNORMAL LOW (ref 3.87–5.11)
RDW: 25 % — ABNORMAL HIGH (ref 11.5–15.5)
WBC: 3.8 10*3/uL — ABNORMAL LOW (ref 4.0–10.5)
nRBC: 0 % (ref 0.0–0.2)

## 2021-04-19 LAB — RESP PANEL BY RT-PCR (FLU A&B, COVID) ARPGX2
Influenza A by PCR: NEGATIVE
Influenza B by PCR: NEGATIVE
SARS Coronavirus 2 by RT PCR: NEGATIVE

## 2021-04-19 LAB — ABO/RH: ABO/RH(D): O NEG

## 2021-04-19 LAB — I-STAT BETA HCG BLOOD, ED (MC, WL, AP ONLY): I-stat hCG, quantitative: <5 m[IU]/mL (ref <5)

## 2021-04-19 LAB — PREPARE RBC (CROSSMATCH)

## 2021-04-19 LAB — LIPASE, BLOOD: Lipase: 30 U/L (ref 11–51)

## 2021-04-19 MED ORDER — IOHEXOL 9 MG/ML PO SOLN
ORAL | Status: AC
  Administered 2021-04-19: 500 mL
  Filled 2021-04-19: qty 1000

## 2021-04-19 MED ORDER — DIPHENHYDRAMINE HCL 25 MG PO CAPS
25.0000 mg | ORAL_CAPSULE | Freq: Four times a day (QID) | ORAL | Status: DC | PRN
  Administered 2021-04-19: 25 mg via ORAL
  Filled 2021-04-19: qty 1

## 2021-04-19 MED ORDER — POLYETHYLENE GLYCOL 3350 17 G PO PACK: 17.0000 g | PACK | Freq: Every day | ORAL | Status: DC | PRN

## 2021-04-19 MED ORDER — POTASSIUM CHLORIDE CRYS ER 20 MEQ PO TBCR
40.0000 meq | EXTENDED_RELEASE_TABLET | Freq: Two times a day (BID) | ORAL | Status: AC
  Administered 2021-04-19 – 2021-04-20 (×2): 40 meq via ORAL
  Filled 2021-04-19 (×2): qty 2

## 2021-04-19 MED ORDER — ACETAMINOPHEN 650 MG RE SUPP
650.0000 mg | Freq: Four times a day (QID) | RECTAL | Status: DC | PRN
Start: 2021-04-19 — End: 2021-04-21

## 2021-04-19 MED ORDER — SODIUM CHLORIDE 0.9% IV SOLUTION: Freq: Once | INTRAVENOUS | Status: AC

## 2021-04-19 MED ORDER — ACETAMINOPHEN 325 MG PO TABS
650.0000 mg | ORAL_TABLET | Freq: Once | ORAL | Status: AC
  Administered 2021-04-19: 650 mg via ORAL
  Filled 2021-04-19: qty 2

## 2021-04-19 MED ORDER — ACETAMINOPHEN 325 MG PO TABS: 650.0000 mg | ORAL_TABLET | Freq: Four times a day (QID) | ORAL | Status: DC | PRN

## 2021-04-19 MED ORDER — HYDROCORTISONE 1 % EX CREA
TOPICAL_CREAM | Freq: Two times a day (BID) | CUTANEOUS | Status: DC
  Filled 2021-04-19 (×2): qty 28

## 2021-04-19 MED ORDER — MEGESTROL ACETATE 40 MG PO TABS
40.0000 mg | ORAL_TABLET | Freq: Three times a day (TID) | ORAL | Status: DC
  Administered 2021-04-19 – 2021-04-20 (×5): 40 mg via ORAL
  Filled 2021-04-19 (×6): qty 1

## 2021-04-19 NOTE — ED Notes (Signed)
RN called blood bank and blood is not available yet

## 2021-04-19 NOTE — ED Notes (Signed)
RN ambulated pt to bathroom.  

## 2021-04-19 NOTE — ED Notes (Signed)
RN attempted report x1.  

## 2021-04-19 NOTE — ED Triage Notes (Addendum)
Pt states her period has been irregular. Last menstrual cycle in January. Pt started her period two weeks ago and is now feeling weak from bleeding. Pt also complains of a spot on her left breast that was itching her. It has now spread and continues to itch really bad

## 2021-04-19 NOTE — Hospital Course (Addendum)
Progress Note: 5/13  Feeling better this morning. Changed two pads which had light bleeding. The bleeding overall has slowed down. Eating and drinking okay. Abdominal pain "in and out." A little bit of shortness of breath last night. Rash is still itchy, looking better than yesterday. Discussed diagnosis of fibroids and that is the reason for her bleeding and she will follow up with gynecologist. Also discussed microscopic hematuria and that this could be contamination and will need repeat UA,   Patient reports that she has been cycling for the past 2 weeks. She came to the ED because she felt so fatigued, SHOB, light headedness today after coming back from grocery shopping. She has been having heavy clots and is soaking through multiple pads. She also has had left sided abdominal pain that has been present for months, and she is unable to sleep on her L side.   Her periods have been occurring every 3 months and this has occurred over the past year. Her periods usually last for 7 days.   The L sided pain occurs between her periods, and is very painful. Pain is 10/10 and does not radiate. The pain is sharp in nature and just "stays." She has not tried any medications for the pain. Her pain is so intense that she occasionally vomits. She also endorses nausea. The N/V and pain are alleviated by her period starting.   FH:  DM: none HTN: none Cancer: Aunt w/ breast cancer   SH:  Works as a Psychologist, sport and exercise Tobacco: Rarely uses cigarettes. Started 2018 ETOH: 1-2 ETOH beverages a week Drugs: "Moments"   PMH:  None

## 2021-04-19 NOTE — ED Notes (Signed)
Got patient on the monitor did ekg shown to Dr Melina Copa

## 2021-04-19 NOTE — ED Provider Notes (Signed)
[facility] EMERGENCY DEPARTMENT Provider Note   CSN: [id number] Arrival date & time: 04/19/21  1025     History Chief Complaint  Patient presents with  . Vaginal Bleeding    Anna [name] is a 46 y.o. female with pertinent past medical history of hypertension who presents emerged department today for chief complaint of vaginal bleeding.  Patient states that her menstrual cycle has been irregular over the past year, her last normal menstrual cycle was in January.  Patient states that her menstrual cycle started 2 weeks ago and she is still bleeding, is now feeling fatigued from the bleeding.  Patient states that she was unable to walk to the store yesterday because she was extremely fatigued.  Patient states that she does have a history of anemia, last hemoglobin here was 11, patient states that she has received 3 blood transfusions over at [facility] in the past couple of years.  Her last one was April of last year.  Is not on any iron supplements currently.  Patient denies any bleeding elsewhere, states that she is going through about 5 pads a day, sometimes having to double up, however over the past couple days has been getting lighter.Denies any fevers, myalgias.  Denies any shortness of breath or chest pain.  Patient also does admit to some left lower quadrant pain over the past couple of days, states that unbearable to lay on that side.  Does not radiate anywhere, no pain to her back.  Patient also states that she feels slightly nauseous, no vomiting.  Denies any diarrhea, has been eating normally.  Denies any anticoagulants, is not on any OCPs.  Patient denies any vaginal pain, vaginal discharge, or vaginal itching.  Denies any chance of STDs, is sexually active.  Patient also admits to a rash on her left breast, started as a small bite and has progressed throughout her left breast now.  Patient states it is extremely itchy, not painful.  Has been trying ointments without much  relief.  Denies any new soaps, lotions or detergents.  Did not see a bug biting her.  Is nontender to palpation, no erythema or fevers. HPI     Past Medical History:  Diagnosis Date  . Hypertension     There are no problems to display for this patient.   Past Surgical History:  Procedure Laterality Date  . FRACTURE SURGERY  right ankle     OB History    Gravida  0   Para  0   Term  0   Preterm  0   AB  0   Living        SAB  0   IAB  0   Ectopic  0   Multiple      Live Births              No family history on file.  Social History   Tobacco Use  . Smoking status: Current Some Day Smoker  . Smokeless tobacco: Never Used  Vaping Use  . Vaping Use: Never used  Substance Use Topics  . Alcohol use: Yes    Comment: occassionally, not often  . Drug use: Yes    Types: Cocaine    Home Medications Prior to Admission medications   Not on File    Allergies    Latex and Peanut-containing drug products  Review of Systems   Review of Systems  Constitutional: Positive for fatigue. Negative for chills, diaphoresis and fever.  HENT:  Negative for congestion, sore throat and trouble swallowing.   Eyes: Negative for pain and visual disturbance.  Respiratory: Negative for cough, shortness of breath and wheezing.   Cardiovascular: Negative for chest pain, palpitations and leg swelling.  Gastrointestinal: Positive for abdominal pain and nausea. Negative for abdominal distention, diarrhea and vomiting.  Genitourinary: Positive for menstrual problem and vaginal bleeding. Negative for decreased urine volume, difficulty urinating, dysuria, enuresis, flank pain, genital sores, pelvic pain and urgency.  Musculoskeletal: Negative for back pain, neck pain and neck stiffness.  Skin: Negative for pallor.  Neurological: Negative for dizziness, speech difficulty, weakness and headaches.  Psychiatric/Behavioral: Negative for confusion.    Physical Exam Updated Vital  Signs BP 131/64   Pulse 68   Temp 98.2 F (36.8 C) (Oral)   Resp 17   LMP 04/08/2021   SpO2 100%   Physical Exam Constitutional:      General: She is not in acute distress.    Appearance: Normal appearance. She is not ill-appearing, toxic-appearing or diaphoretic.  HENT:     Mouth/Throat:     Mouth: Mucous membranes are moist.     Pharynx: Oropharynx is clear.  Eyes:     General: No scleral icterus.    Extraocular Movements: Extraocular movements intact.     Pupils: Pupils are equal, round, and reactive to light.  Cardiovascular:     Rate and Rhythm: Normal rate and regular rhythm.     Pulses: Normal pulses.     Heart sounds: Normal heart sounds.  Pulmonary:     Effort: Pulmonary effort is normal. No respiratory distress.     Breath sounds: Normal breath sounds. No stridor. No wheezing, rhonchi or rales.  Chest:     Chest wall: No tenderness.  Abdominal:     General: Abdomen is flat. There is no distension.     Palpations: Abdomen is soft.     Tenderness: There is abdominal tenderness (Tenderness to left lower quadrant.). There is no guarding or rebound.  Genitourinary:    Comments: Chaperone present.  Normal external genitalia.  Internal vaginal exam with moderate bleeding coming from cervix, cervix is closed.  Nonfriable, no cervical motion tenderness, there is some tenderness to the left adnexa, no tenderness to right adnexa on bimanual.  No vaginal discharge noted. Musculoskeletal:        General: No swelling or tenderness. Normal range of motion.     Cervical back: Normal range of motion and neck supple. No rigidity.     Right lower leg: No edema.     Left lower leg: No edema.  Skin:    General: Skin is warm and dry.     Capillary Refill: Capillary refill takes less than 2 seconds.     Coloration: Skin is not pale.     Comments: Patient with macular papular rash of left breast with some discoloration as seen in picture.  Area is not tender to palpation.  No warmth or  surrounding erythema.  No fluctuant areas or hardened areas.  No submandibular, axillary or cervical lymphadenopathy.  No breastmasses palpated, no nipple discharge.  Neurological:     General: No focal deficit present.     Mental Status: She is alert and oriented to person, place, and time.  Psychiatric:        Mood and Affect: Mood normal.        Behavior: Behavior normal.       ED Results / Procedures / Treatments   Labs (all labs ordered  are listed, but only abnormal results are displayed) Labs Reviewed  CBC - Abnormal; Notable for the following components:      Result Value   WBC 3.8 (*)    RBC 2.93 (*)    Hemoglobin 4.8 (*)    HCT 18.1 (*)    MCV 61.8 (*)    MCH 16.4 (*)    MCHC 26.5 (*)    RDW 25.0 (*)    Platelets 480 (*)    All other components within normal limits  BASIC METABOLIC PANEL - Abnormal; Notable for the following components:   Potassium 3.0 (*)    Glucose, Bld 101 (*)    All other components within normal limits  URINALYSIS, ROUTINE W REFLEX MICROSCOPIC - Abnormal; Notable for the following components:   APPearance HAZY (*)    Hgb urine dipstick LARGE (*)    Protein, ur 30 (*)    Bacteria, UA RARE (*)    All other components within normal limits  RESP PANEL BY RT-PCR (FLU A&B, COVID) ARPGX2  WET PREP, GENITAL  LIPASE, BLOOD  I-STAT BETA HCG BLOOD, ED ([facility], [facility], [facility] ONLY)  TYPE AND SCREEN  ABO/RH  PREPARE RBC (CROSSMATCH)  GC/CHLAMYDIA PROBE AMP ([facility]) NOT AT [facility]    EKG EKG Interpretation  Date/Time:  Thursday Apr 19 2021 11:41:49 EDT Ventricular Rate:  81 PR Interval:  175 QRS Duration: 86 QT Interval:  381 QTC Calculation: 443 R Axis:   47 Text Interpretation: Sinus rhythm No significant change since prior 12/11 Confirmed by Aletta Edouard [phone]) on 04/19/2021 11:51:20 AM   Radiology US PELVIC COMPLETE WITH TRANSVAGINAL  Result Date: 04/19/2021 CLINICAL DATA:  Vaginal bleeding EXAM: TRANSABDOMINAL AND TRANSVAGINAL ULTRASOUND  OF PELVIS TECHNIQUE: Study was performed transabdominally to optimize pelvic field of view evaluation and transvaginally to optimize internal visceral architecture evaluation. COMPARISON:  None FINDINGS: Uterus Measurements: 15.4 x 9.0 x 11.4 cm = volume: 826.9 mL. Echotexture of the uterus is diffusely inhomogeneous, likely due to diffuse leiomyomatous change. There is a hypoechoic mass along the superior fundus measuring 9.2 x 6.4 x 6.3 cm, a presumed leiomyoma. 6 mm cervical nabothian cyst seen. Endometrium Thickness: 12 mm.  No focal abnormality visualized. Right ovary Unable to visualize by either transabdominal or transvaginal technique. No right-sided pelvic mass appreciable by ultrasound. Left ovary Unable to visualize by either transabdominal transvaginal technique. No left-sided pelvic mass appreciable by ultrasound. Other findings No abnormal free fluid. IMPRESSION: 1. Enlarged apparently leiomyomatous uterus. Dominant leiomyoma arises from the superior uterine fundus measuring 9.2 x 6.4 x 6.3 cm. 2. Endometrium does not appear thickened for premenopausal state. No endometrial lesion evident. 3.  Subcentimeter cervical nabothian cyst. 4. Neither ovary could be visualized by transabdominal transvaginal assessment. No extrauterine pelvic mass evident. No appreciable free fluid. Electronically Signed   By: Lowella Grip III M.D.   On: 04/19/2021 14:21    Procedures .Critical Care Performed by: Alfredia Client, PA-C Authorized by: Alfredia Client, PA-C   Critical care provider statement:    Critical care time (minutes):  45   Critical care was time spent personally by me on the following activities:  Discussions with consultants, evaluation of patient's response to treatment, examination of patient, ordering and performing treatments and interventions, ordering and review of laboratory studies, ordering and review of radiographic studies, pulse oximetry, re-evaluation of patient's condition,  obtaining history from patient or surrogate and review of old charts     Medications Ordered in ED Medications  hydrocortisone cream  1 % ( Topical Given 04/19/21 1549)  megestrol (MEGACE) tablet 40 mg (40 mg Oral Given 04/19/21 1601)  acetaminophen (TYLENOL) tablet 650 mg (650 mg Oral Given 04/19/21 1223)  iohexol (OMNIPAQUE) 9 MG/ML oral solution (500 mLs  Contrast Given 04/19/21 1226)  0.9 %  sodium chloride infusion (Manually program via Guardrails IV Fluids) ( Intravenous New Bag/Given 04/19/21 1655)    ED Course  I have reviewed the triage vital signs and the nursing notes.  Pertinent labs & imaging results that were available during my care of the patient were reviewed by me and considered in my medical decision making (see chart for details).  Clinical Course as of 04/19/21 1749  Thu Apr 19, 6828  4945 45 year old female with likely perimenopause here feeling weak and continuing to have bleeding.  Hemoglobin critically low at 4.8.  Getting pelvic ultrasound and transfusion.  Reassess for need for admission. [MB]    Clinical Course User Index [MB] Hayden Rasmussen, MD   MDM Rules/Calculators/A&P                          Katalin Colledge is a 45 y.o. female with pertinent past medical history of hypertension who presents emerged department today for chief complaint of vaginal bleeding.  Vaginal exam with some blood in vault, hemoglobin here today 4.8.  Baseline around 11, however patient states that she has had to have 3 blood transfusions on April of last year due to anemia.  Patient states that she has not been compliant with iron supplements, has not had to have a blood transfusion done.  Per chart review 11 months ago, hemoglobin was 11.8.  Patient also admitting to left lower quadrant pain, is diffusely tender with some left adnexal tenderness as well, will obtain CT abdomen pelvis and pelvic ultrasound.  Will obtain CT abdomen pelvis without contrast due to contrast  shortage.  Ultrasound does show large leiomyoma, unable to see ovaries at this time due to large uterus.  This is most likely causing patient's symptoms, however still pending CT abdomen pelvis.  Blood has been ordered.  Spoke to Dr. Talbert Nan who recommends hospitalist admission due to symptomatic anemia with addition of Megace and patient will be seen outpatient for leiomyoma.   Spoke to Dr. Gilford Rile, internal medicine resident who will admit patient.  He is aware that yes CT abdomen pelvis has not been done yet.  Patient is receiving blood, stable for admission at this time.  The patient appears reasonably stabilized for admission considering the current resources, flow, and capabilities available in the ED at this time, and I doubt any other [facility] requiring further screening and/or treatment in the ED prior to admission.  Final Clinical Impression(s) / ED Diagnoses Final diagnoses:  Vaginal bleeding  Leiomyoma  Symptomatic anemia    Rx / DC Orders ED Discharge Orders    None       Alfredia Client, PA-C 04/19/21 1753    Hayden Rasmussen, MD 04/19/21 [phone]

## 2021-04-19 NOTE — ED Notes (Signed)
Hgb of 4.8, RN victoria notified

## 2021-04-19 NOTE — ED Notes (Signed)
RN attempted report x2 

## 2021-04-19 NOTE — H&P (Signed)
Date: 04/19/2021               Patient Name:  Anna [name] MRN: [id number]  DOB: [birth year] Age / Sex: 45 y.o., female   PCP: Patient, No Pcp Per (Inactive)         Medical Service: Internal Medicine Teaching Service         Attending Physician: Dr. Heber Hopkins, Rachel Moulds, DO    First Contact: Dr. Armando Reichert MD Pager: [phone]   Second Contact: Dr. Gilford Rile Pager: [phone]       After Hours (After 5p/  First Contact Pager: [phone]  weekends / holidays): Second Contact Pager: [phone]   Chief Complaint: Vaginal Bleeding  History of Present Illness:   Anna [name] is a 45 y.o. female with hx of HTN presenting with vaginal bleeding. Patient reports that she has been cycling for the past 2 weeks. She came to the ED because she felt so fatigued, SHOB, light headedness today after coming back from grocery shopping. She has been having heavy clots and is soaking through multiple pads. Her periods have been occurring every 3 months and this has occurred over the past year. Her periods usually last for 7 days. She also has had left sided abdominal pain that has been present for months, and she is unable to sleep on her Left  side. The Lt sided pain occurs between her periods, and is very painful. Pain is 10/10 and does not radiate. The pain is sharp in nature and just "stays." She has not tried any medications for the pain. Her pain is so intense that she occasionally vomits. She also endorses nausea. The N/V and pain are alleviated by her period starting. Pt denies fever, any burning during urination, dysuria, vaginal pain, vaginal itching, chest pain, headache, dizziness. Pt denies any pelvis infection in the past. Pt had last gynecological check up abut 5 years ago. Pt has 3 children.  Pt reports itchy rash on her left shoulder and underarm. Denies any pain. States she has noticed that 2 weeks ago and has bad itching.  ED Course: Pt presented to ED with vaginal bleeding. She was admitted  with stable vitals but her BP has been fluctuating in the BP with systolic of 73-710 and diastolic of 62-694. She was not tachycardic. CMP showed Na 136, Hypokalemia with low K of 3.0, blood glucose 101, renal function unremarkable. CBC revealed low Hb of 4.8, Low Hct 18.1 , MCV 61.8, MCH 16.4, MCHC 26.5, platelets 480.  Pregnancy test was negative.  EKG shows sinus rhythm.  Urine 0-5 leukocytes, 6-10 RBCs and rare bacteria.  Respiratory panel was negative ultrasound pelvis showed an large leiomyomatous uterus.  Dominant leiomyoma rises from the superior uterine fundus measuring 9.2x 6.4 x 6.3 cm.  Endometrium does not appear thickened.  Ovaries could not be visualized so CT abdominal/ pelvis was ordered which is pending.  Gynecology consulted in the Ed and Dr Talbert Nan recommended starting megestrol.  Pt will be seen outpatient for leiomyoma. Recommended admission due to anemia. Patient admitted to IMTS for further management.   No current outpatient medications  Allergies as of 04/19/2021 - Review Complete 04/19/2021  Allergen Reaction Noted  . Latex Hives and Swelling 12/18/2011  . Peanut-containing drug products Itching 12/18/2011    Past Medical History:  Diagnosis Date  . Hypertension     Past Surgical History:  Procedure Laterality Date  . FRACTURE SURGERY  right ankle   Family History  No family  history on file. No h/o HTN in the family. H/O of  Aunt w/ breast cancer   Social History   Tobacco Use  . Smoking status: Current Some Day Smoker  . Smokeless tobacco: Never Used  Vaping Use  . Vaping Use: Never used  Substance Use Topics  . Alcohol use: Yes    Comment: occassionally, not often  . Drug use: Yes    Types: Cocaine  Ocassional use of drugs.  Pt has 3 children . The younger one is 81 year old . 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 Started 2018 ETOH: 1-2 beverages a week (beer)  Review of Systems: Review of Systems   Constitutional: Positive for malaise/fatigue. Negative for chills and fever.  Respiratory: Positive for shortness of breath. Negative for cough.   Cardiovascular: Negative for chest pain.  Gastrointestinal: Positive for abdominal pain, nausea and vomiting. Negative for constipation and diarrhea.  Genitourinary: Negative for dysuria, frequency and hematuria.       Vaginal bleeding.  Musculoskeletal: Negative for myalgias.  Skin: Positive for rash.       Itchy rash present on Left upper chest and underarm.  Neurological: Negative for dizziness, focal weakness, weakness and headaches.   negative other than what was mentioned in HPI  Physical Exam: Blood pressure (!) 128/93, pulse 72, temperature 98.2 F (36.8 C), temperature source Oral, resp. rate 17, last menstrual period 04/08/2021, SpO2 100 %. Physical Exam Vitals and nursing note reviewed.  Constitutional:      Appearance: Normal appearance. She is normal weight.  HENT:     Head: Normocephalic and atraumatic.  Cardiovascular:     Rate and Rhythm: Normal rate and regular rhythm.  Pulmonary:     Effort: Pulmonary effort is normal.     Breath sounds: Normal breath sounds.  Abdominal:     Palpations: There is mass.     Tenderness: There is abdominal tenderness.     Comments: Palpable Mass present in Left lower quadrant Mild tenderness to palpation  Musculoskeletal:        General: Normal range of motion.     Cervical back: Normal range of motion.  Skin:    General: Skin is warm and dry.     Findings: Rash present.     Comments: Maculopapular weeping rash present in Left upper chest and underarm.   Neurological:     General: No focal deficit present.     Mental Status: She is alert and oriented to person, place, and time.  Psychiatric:        Mood and Affect: Mood normal.        Behavior: Behavior normal.     EKG: personally reviewed my interpretation is sinus rhythm  CXR: Not done  US PELVIC COMPLETE WITH  TRANSVAGINAL  Result Date: 04/19/2021 CLINICAL DATA:  Vaginal bleeding EXAM: TRANSABDOMINAL AND TRANSVAGINAL ULTRASOUND OF PELVIS TECHNIQUE: Study was performed transabdominally to optimize pelvic field of view evaluation and transvaginally to optimize internal visceral architecture evaluation. COMPARISON:  None FINDINGS: Uterus Measurements: 15.4 x 9.0 x 11.4 cm = volume: 826.9 mL. Echotexture of the uterus is diffusely inhomogeneous, likely due to diffuse leiomyomatous change. There is a hypoechoic mass along the superior fundus measuring 9.2 x 6.4 x 6.3 cm, a presumed leiomyoma. 6 mm cervical nabothian cyst seen. Endometrium Thickness: 12 mm.  No focal abnormality visualized. Right ovary Unable to visualize by either transabdominal or transvaginal technique. No right-sided pelvic mass appreciable by ultrasound.  Left ovary Unable to visualize by either transabdominal transvaginal technique. No left-sided pelvic mass appreciable by ultrasound. Other findings No abnormal free fluid. IMPRESSION: 1. Enlarged apparently leiomyomatous uterus. Dominant leiomyoma arises from the superior uterine fundus measuring 9.2 x 6.4 x 6.3 cm. 2. Endometrium does not appear thickened for premenopausal state. No endometrial lesion evident. 3.  Subcentimeter cervical nabothian cyst. 4. Neither ovary could be visualized by transabdominal transvaginal assessment. No extrauterine pelvic mass evident. No appreciable free fluid. Electronically Signed   By: Lowella Grip III M.D.   On: 04/19/2021 14:21    Assessment & Plan by Problem: Active Problems:   Vaginal bleeding Abdominal pain HTN Symptomatic Anemia  Anna [name] is a 45 y.o. female with hx of with hx of HTN presenting with vaginal bleeding, fatigue and abdominal pain found to have Hb of 4.8.  Symptomatic Anemia Vaginal bleeding Patient presented with bleeding for 2 weeks and fatigue x 2 days. Hb at admission was 4.8.  Ultrasound shows large leiomyomatous uterus.   Dominant leiomyoma rises from the superior uterine fundus measuring 9.2x 6.4 x 6.3 cm. Ovaries not visible on Usg. CT abdominal pelvis pending.  Bleeding most likely due to leiomyoma. GYN consulted, started on megestrol. Pt will be seen outpatient for leiomyoma. Recommended admission due to anemia.  -Gynecology consulted. Appreciate recommendations.  -Monitor vitals closely. -Transfuse 1 PRBC.  -H&H after transfusion.  -f/u CT Abdomen/Pelvis -Continue Megestrol 40 mg 3 times daily. -CBC daily.  Abdominal Pain Abdominal Mass (Leiomyoma) Pt c/o pain for months. On examination, palpable mildly tender mass present in Left Lower quadrant. Ultrasound shows large leiomyomatous uterus.  Dominant leiomyoma rises from the superior uterine fundus measuring 9.2x 6.4 x 6.3 cm. CT abdomen and pelvis pending. Pain likely from Leiomyoma.  -Gynecology consulted. Appreciate recommendations.  -f/u outpatient for leiomyoma.  -Tylenol 650 mg Q6H PRN -F/U CT Abdominal Pelvis scan  Hypokalemia K at admission 3.  -KLOR 40 mEQ once. -CMP tomorrow.   Rash  Pt c/o itchy rash on Left chest x 2 weeks. Maculopapular weeping itchy rash present on Left front chest and underarm. On  DD's include Eczema vs bug bite vs allergy.  -Hydrocortisone 1% creame  -benadryl capsule 25 mg Q6H for itching.  -Monitor   HTN Pt BP stable at admission. Her BP has been fluctuating in the BP with systolic of 950-932 and diastolic of 67-124 mmHg. Pt not on any medication. -Monitor Vitals.  Dispo: Admit patient to Observation with expected length of stay less than 2 midnights.  Signed: Armando Reichert, MD 04/19/2021, 6:12 PM  Pager: [phone] After 5pm on weekdays and 1pm on weekends: On Call pager: [phone]

## 2021-04-19 NOTE — ED Notes (Signed)
Patient transported to Ultrasound 

## 2021-04-20 DIAGNOSIS — D649 Anemia, unspecified: Secondary | ICD-10-CM | POA: Diagnosis present

## 2021-04-20 DIAGNOSIS — R3129 Other microscopic hematuria: Secondary | ICD-10-CM | POA: Diagnosis present

## 2021-04-20 DIAGNOSIS — N858 Other specified noninflammatory disorders of uterus: Secondary | ICD-10-CM | POA: Diagnosis present

## 2021-04-20 LAB — CBC
HCT: 19.6 % — ABNORMAL LOW (ref 36.0–46.0)
HCT: 21.3 % — ABNORMAL LOW (ref 36.0–46.0)
Hemoglobin: 5.8 g/dL — CL (ref 12.0–15.0)
Hemoglobin: 6.5 g/dL — CL (ref 12.0–15.0)
MCH: 19.1 pg — ABNORMAL LOW (ref 26.0–34.0)
MCH: 20.6 pg — ABNORMAL LOW (ref 26.0–34.0)
MCHC: 29.6 g/dL — ABNORMAL LOW (ref 30.0–36.0)
MCHC: 30.5 g/dL (ref 30.0–36.0)
MCV: 64.7 fL — ABNORMAL LOW (ref 80.0–100.0)
MCV: 67.6 fL — ABNORMAL LOW (ref 80.0–100.0)
Platelets: 397 10*3/uL (ref 150–400)
Platelets: 369 10*3/uL (ref 150–400)
RBC: 3.03 MIL/uL — ABNORMAL LOW (ref 3.87–5.11)
RBC: 3.15 MIL/uL — ABNORMAL LOW (ref 3.87–5.11)
RDW: 28.9 % — ABNORMAL HIGH (ref 11.5–15.5)
RDW: 30 % — ABNORMAL HIGH (ref 11.5–15.5)
WBC: 5.9 10*3/uL (ref 4.0–10.5)
WBC: 4.4 10*3/uL (ref 4.0–10.5)
nRBC: 0 % (ref 0.0–0.2)
nRBC: 0 % (ref 0.0–0.2)

## 2021-04-20 LAB — BASIC METABOLIC PANEL
Anion gap: 6 (ref 5–15)
Anion gap: 7 (ref 5–15)
BUN: 9 mg/dL (ref 6–20)
BUN: 8 mg/dL (ref 6–20)
CO2: 25 mmol/L (ref 22–32)
CO2: 25 mmol/L (ref 22–32)
Calcium: 8.9 mg/dL (ref 8.9–10.3)
Calcium: 8.7 mg/dL — ABNORMAL LOW (ref 8.9–10.3)
Chloride: 104 mmol/L (ref 98–111)
Chloride: 105 mmol/L (ref 98–111)
Creatinine, Ser: 0.84 mg/dL (ref 0.44–1.00)
Creatinine, Ser: 0.71 mg/dL (ref 0.44–1.00)
GFR, Estimated: >60 mL/min (ref >60)
GFR, Estimated: >60 mL/min (ref >60)
Glucose, Bld: 103 mg/dL — ABNORMAL HIGH (ref 70–99)
Glucose, Bld: 101 mg/dL — ABNORMAL HIGH (ref 70–99)
Potassium: 3.5 mmol/L (ref 3.5–5.1)
Potassium: 3.5 mmol/L (ref 3.5–5.1)
Sodium: 135 mmol/L (ref 135–145)
Sodium: 137 mmol/L (ref 135–145)

## 2021-04-20 LAB — GC/CHLAMYDIA PROBE AMP (~~LOC~~) NOT AT ARMC
Chlamydia: NEGATIVE
Comment: NEGATIVE
Comment: NORMAL
Neisseria Gonorrhea: NEGATIVE

## 2021-04-20 LAB — PREPARE RBC (CROSSMATCH)

## 2021-04-20 LAB — HEMOGLOBIN AND HEMATOCRIT, BLOOD
HCT: 28.2 % — ABNORMAL LOW (ref 36.0–46.0)
Hemoglobin: 8.5 g/dL — ABNORMAL LOW (ref 12.0–15.0)

## 2021-04-20 LAB — GLUCOSE, CAPILLARY
Glucose-Capillary: 103 mg/dL — ABNORMAL HIGH (ref 70–99)
Glucose-Capillary: 95 mg/dL (ref 70–99)

## 2021-04-20 LAB — HIV ANTIBODY (ROUTINE TESTING W REFLEX): HIV Screen 4th Generation wRfx: NONREACTIVE

## 2021-04-20 MED ORDER — FERROUS SULFATE 325 (65 FE) MG PO TABS: 325.0000 mg | ORAL_TABLET | ORAL | 0 refills | Status: DC

## 2021-04-20 MED ORDER — MEGESTROL ACETATE 40 MG PO TABS: 40.0000 mg | ORAL_TABLET | Freq: Three times a day (TID) | ORAL | 0 refills | Status: DC

## 2021-04-20 MED ORDER — DIPHENHYDRAMINE HCL 25 MG PO CAPS: 25.0000 mg | ORAL_CAPSULE | Freq: Four times a day (QID) | ORAL | 0 refills | Status: DC | PRN

## 2021-04-20 MED ORDER — HYDROCORTISONE 1 % EX CREA: TOPICAL_CREAM | Freq: Two times a day (BID) | CUTANEOUS | 0 refills | Status: DC

## 2021-04-20 MED ORDER — SODIUM CHLORIDE 0.9% IV SOLUTION: Freq: Once | INTRAVENOUS | Status: AC

## 2021-04-20 NOTE — Plan of Care (Signed)

## 2021-04-20 NOTE — TOC Initial Note (Addendum)
Transition of Care [facility]) - Initial/Assessment Note    Patient Details  Name: Anna [name] MRN: [id number] Date of Birth: May 13, 1976  Transition of [facility]) CM/SW Contact:    Verdell Carmine, RN Phone Number: 04/20/2021, 8:52 AM  Clinical Narrative:                 Admitted for severe anemia  Having vaginal bleeding heavy. Ultrasound reveals leioyimoma. . Gynecology has been  Consulted for further management . Transfusion of PC and potassium replacement. CM to follow for needs, patient uninsured, may need MATCH refer to [facility] for PCP  Expected Discharge Plan: Home/Self Care Barriers to Discharge: Continued Medical Work up   Patient Goals and CMS Choice        Expected Discharge Plan and Services Expected Discharge Plan: Home/Self Care   Discharge Planning Services: CM Consult   Living arrangements for the past 2 months: Apartment                                      Prior Living Arrangements/Services Living arrangements for the past 2 months: Apartment Lives with:: Self Patient language and need for interpreter reviewed:: Yes        Need for Family Participation in Patient Care: Yes (Comment) Care giver support system in place?: Yes (comment)   Criminal Activity/Legal Involvement Pertinent to Current Situation/Hospitalization: No - Comment as needed  Activities of Daily Living Home Assistive Devices/Equipment: None ADL Screening (condition at time of admission) Patient's cognitive ability adequate to safely complete daily activities?: Yes Is the patient deaf or have difficulty hearing?: No Does the patient have difficulty seeing, even when wearing glasses/contacts?: No Does the patient have difficulty concentrating, remembering, or making decisions?: No Patient able to express need for assistance with ADLs?: Yes Does the patient have difficulty dressing or bathing?: No Independently performs ADLs?: Yes (appropriate for developmental age) Does the patient  have difficulty walking or climbing stairs?: No Weakness of Legs: None Weakness of Arms/Hands: None  Permission Sought/Granted                  Emotional Assessment       Orientation: : Oriented to Self,Oriented to Place,Oriented to  Time,Oriented to Situation Alcohol / Substance Use: Not Applicable Psych Involvement: No (comment)  Admission diagnosis:  Vaginal bleeding [N93.9] Leiomyoma [D21.9] Symptomatic anemia [D64.9] Patient Active Problem List   Diagnosis Date Noted  . Vaginal bleeding 04/19/2021   PCP:  Patient, No Pcp Per (Inactive) Pharmacy:   [facility] ([place]), [place] - [address] [place] ([place]) [place] [address] Phone: [phone] Fax: [phone]     Social Determinants of Health (SDOH) Interventions    Readmission Risk Interventions No flowsheet data found.

## 2021-04-20 NOTE — Progress Notes (Signed)
   Subjective: Feeling better this morning. Changed two pads which had light bleeding. The bleeding overall has slowed down. Eating and drinking okay. Abdominal pain "in and out." A little bit of shortness of breath last night. Rash is still itchy, looking better than yesterday. Discussed diagnosis of fibroids and that is the reason for her bleeding and she will follow up with gynecologist. Also discussed microscopic hematuria and that this could be contamination and will need repeat UA.  Objective:  Vital signs in last 24 hours: Vitals:   04/20/21 0141 04/20/21 0144 04/20/21 0204 04/20/21 0445  BP: 115/66 118/69 113/67 (!) 112/93  Pulse: 90 85 76 65  Resp: 18 16 16 19   Temp: 98.2 F (36.8 C) 98 F (36.7 C) 98 F (36.7 C) 98 F (36.7 C)  TempSrc: Oral Oral Oral Oral  SpO2: 100% 100% 100% 99%  Weight:      Height:       Physical Exam Constitutional: Lying in bed. Not in acute distress. HEENT- No icterus Cardiovascular: regular rate and rhythm Pulmonary: clear to auscultation b/l Abdominal: Palpable mildly tender mass present in LLQ Musculoskeletal: ROM normal Skin: warm and dry. Maculopapular rash present on Left chest  Neurological: alert and oriented  Assessment/Plan: Anna [name] is a 45 y.o. female with hx of HTN presenting with vaginal bleeding and fatigue found to have symptomatic anemia.   Active Problems:   Vaginal bleeding Uterine fibroids Rash HTN  Symtomatic anemia Vaginal Bleeding Pt admitted with vaginal bleeding. Overnight Pt received 2 PRBC's. Hb post transfusion 6.5, HCT 21.3,   CT abdomen/pelvis showed Enlarged uterus likely due to fibroids.  -Gynecology consulted. Appreciate Recs.  -Transfuse 1 more PRB's. -H&H post transfusion. -Continue Megace 40 mg TID -Pt will F/U outpatient for Fibroids. -Monitor vitals.   Abdominal Pain Abdominal Mass (Leiomyoma) -Mildly tender abdominal mass present in Left LQ. CT abdomen/pevis shows fibroids.  -Pt will  f/u outpatient for Fibroids. -Continue Tylenol PRN for pain.  Rash -Rash getting better. Benadryl helped. Eczema vs bug bite vs allergy. -Continue Benadryl capsule and Hydrocortisone cream  HTN Not on any medication. One reading of 149/109 mmHg yesterday. BP stable in the morning. -Monitor vitals.   Hematuria Urinalysis showed 0-5leukocytes,6-10 RBCs and rare bacteria. Hematuria likely due to contamination. CT Abdomen and pelvis showed Questionable asymmetric wall thickening/soft tissue density at the left posterior bladder.Pt recommended to repeat Urinalysis at outpatient visit. If hematuria still persists recommended to Follow up with Urology.  Dispo: Anticipated discharge in approximately 1-2 day(s).   Armando Reichert, MD 04/20/2021, 7:05 AM Pager: [phone] After 5pm on weekdays and 1pm on weekends: On Call pager [phone]

## 2021-04-20 NOTE — Discharge Instructions (Signed)
To Ms. Ochsner,  It was a pleasure taking care of you during your stay at [facility]. During your stay, you were found to have low blood counts due to possible fibroids and leiomyoma. You were given blood, and started on a medication called Megace to help control your bleeding. Please continue to take your Megace three times daily. Additionally, we will discharge you with an Iron pill. Please take this pill once every other day to replete your iron stores. Also, please call to schedule and appointment with your OBGYN for a follow up appointment.   We also noted that you had some blood in your urine during your stay. When you have your follow up visit, please inquire about a repeat urinalysis to reassess the blood in your urine. It may be a contaminate, but if it persists, you may need to establish with a urologist (bladder specialist).    If you begin to notice shortness of breath, dizziness/light headedness, heart palpitations, or extreme fatigue, please go to the nearest emergency department to be re-evaluated.

## 2021-04-20 NOTE — Discharge Summary (Addendum)
Name: Anna [name] MRN: [id number] DOB: 09/22/1976 45 y.o. PCP: Patient, No Pcp Per (Inactive)  Date of Admission: 04/19/2021 10:25 AM Date of Discharge: 04/20/2021 04/20/2021 Attending Physician: No att. providers found   Discharge Diagnosis: Symptomatic anemia Vaginal bleeding Uterine Fibroids  Discharge Medications: Allergies as of 04/20/2021       Reactions   Latex Hives, Swelling   Peanut-containing Drug Products Itching        Medication List     TAKE these medications    diphenhydrAMINE 25 mg capsule Commonly known as: BENADRYL Take 1 capsule (25 mg total) by mouth every 6 (six) hours as needed for itching.   ferrous sulfate 325 (65 FE) MG tablet Take 1 tablet (325 mg total) by mouth every other day.   hydrocortisone cream 1 % Apply topically 2 (two) times daily.   megestrol 40 MG tablet Commonly known as: MEGACE Take 1 tablet (40 mg total) by mouth 3 (three) times daily.        Disposition and follow-up:   Ms.Anna [name] was discharged from [facility] in Stable condition.  At the hospital follow up visit please address:  1.  Follow up: With Gynecology Dr. Rip Harbour in 1 week.  [facility] for PCP.  2.  Labs / imaging needed at time of follow-up: CBC, Urinalysis   3.  Pending labs/ test needing follow-up: None  Follow-up Appointments:  Follow-up Information     [name], MD. Schedule an appointment as soon as possible for a visit in 1 week(s).   Specialty: Obstetrics and Gynecology Why: Please call to make an appointment for hospital follow up. Contact information: [place] [address] [address]. Call.   Why: establish primary care they have pharmacy and [facility] information: [place] [address] [phone]                Hospital Course  by problem list: Iron deficiency anemia secondary to Vaginal Bleeding Pt admitted with stable vitals. CMP showed Hypokalemia of K of 3.0 (Repleted), CBC revealed low Hb of 4.8, Urine negative for infection, 6-10 Rbc's. Ultrasound pelvis showed an large leiomyomatous uterus with dominant leiomyoma from the superior uterine fundus measuring 9.2x 6.4 x 6.3 cm. Ovaries could not be visualized. CT abdomen/pelvis showed Enlarged uterus likely due to fibroids. Gynecology consulted, recommended starting Megase. Pt was admitted to observation for management of anemia. Pt received 3 PRBC's. Hb post transfusion 8.5. Bleeding reduced with Megase. Recommended to take OTC oral iron supplements. Pt will be seen outpatient Gynecology for leiomyoma and PCP for anemia. Abdominal Pain Abdominal Mass (Leiomyoma) Pt c/o pain in Left LQ . On exam Pt has Mildly tender abdominal mass present in Left LQ. CT abdomen/pevis shows fibroids. Pain managed with Tylenol. Pt will f/u outpatient for Fibroids with Gynecology.  Rash Maculopapular itchy rash was present on Left upper chest and underarm. Pt was given Benadryl capsule and topical 1% Hydrocortisone cream. Rash and itching improved.  Microscopic Hematuria Urinalysis showed 0-5 leukocytes, 6-10 RBCs and rare bacteria. Hematuria likely due to contamination. CT Abdomen and pelvis showed Questionable asymmetric wall thickening/soft tissue density at the left posterior bladder. Pt recommended to repeat Urinalysis at outpatient visit. If hematuria still persists recommended to Follow up with Urology.  Subjective on day of discharge: Feeling better this morning. Changed two pads  which had light bleeding. The bleeding overall has slowed down. Eating and drinking okay. Abdominal pain "in and out." A little bit of shortness of breath last night. Rash is still itchy, looking better than yesterday. Discussed diagnosis of fibroids and that is the reason for her bleeding and she will follow  up with gynecologist. Also discussed microscopic hematuria and that this could be contamination and will need repeat UA.  Discharge Exam:   BP 122/70 (BP Location: Left Arm)   Pulse 73   Temp 97.9 F (36.6 C) (Oral)   Resp 18   Ht 5\' 7"  (1.702 m)   Wt 161 lb 14.4 oz (73.4 kg)   LMP 04/08/2021   SpO2 100%   BMI 25.36 kg/m  Discharge exam: Physical Exam Vitals and nursing note reviewed.  Constitutional:      General: She is not in acute distress.    Appearance: Normal appearance. She is normal weight. She is not ill-appearing, toxic-appearing or diaphoretic.  HENT:     Head: Normocephalic and atraumatic.  Cardiovascular:     Rate and Rhythm: Normal rate and regular rhythm.  Pulmonary:     Effort: Pulmonary effort is normal.     Breath sounds: Normal breath sounds.  Abdominal:     General: Bowel sounds are normal. There is no distension.     Palpations: There is mass.     Tenderness: There is abdominal tenderness.  Musculoskeletal:        General: Normal range of motion.  Skin:    General: Skin is warm and dry.     Findings: Rash present.  Neurological:     General: No focal deficit present.     Mental Status: She is alert and oriented to person, place, and time.  Psychiatric:        Mood and Affect: Mood normal.        Behavior: Behavior normal.    Pertinent Labs, Studies, and Procedures:  CT Abdomen Pelvis Wo Contrast  Result Date: 04/19/2021 CLINICAL DATA:  Left lower quadrant pain EXAM: CT ABDOMEN AND PELVIS WITHOUT CONTRAST TECHNIQUE: Multidetector CT imaging of the abdomen and pelvis was performed following the standard protocol without IV contrast. COMPARISON:  None. FINDINGS: Lower chest: Lung bases demonstrate no acute consolidation or effusion. Normal cardiac size. Incompletely visualized fat density circumscribed lesion at the left breast. Hepatobiliary: Subcentimeter hypodensity within the inferior right hepatic lobe. No gallstones, gallbladder wall thickening,  or biliary dilatation. Pancreas: Unremarkable. No pancreatic ductal dilatation or surrounding inflammatory changes. Spleen: Normal in size without focal abnormality. Adrenals/Urinary Tract: Adrenal glands are normal. Mildly prominent left renal pelvis. Moderately dilated right renal pelvis without significant calyceal dilatation. The ureters are nonenlarged. Questionable asymmetric wall thickening at the left posterior bladder. Stomach/Bowel: Stomach is within normal limits. Appendix appears normal. No evidence of bowel wall thickening, distention, or inflammatory changes. Vascular/Lymphatic: No significant vascular findings are present. No enlarged abdominal or pelvic lymph nodes. Reproductive: Markedly enlarged uterus presumably due to fibroids. No discrete adnexal mass Other: Negative for free air or free fluid. Small fat containing umbilical hernia. Musculoskeletal: No acute or significant osseous findings. IMPRESSION: 1. Right greater than left renal pelvis dilatation without hydroureter or obstructing ureteral stone. 2. Enlarged uterus likely due to fibroids 3. Questionable asymmetric wall thickening/soft tissue density at the left posterior bladder, no definite correlate on recent pelvic ultrasound imaging though only limited views of the bladder. Could consider correlation with urinalysis with dedicated ultrasound of the bladder as indicated. Electronically  Signed   By: Donavan Foil M.D.   On: 04/19/2021 18:42   US PELVIC COMPLETE WITH TRANSVAGINAL  Result Date: 04/19/2021 CLINICAL DATA:  Vaginal bleeding EXAM: TRANSABDOMINAL AND TRANSVAGINAL ULTRASOUND OF PELVIS TECHNIQUE: Study was performed transabdominally to optimize pelvic field of view evaluation and transvaginally to optimize internal visceral architecture evaluation. COMPARISON:  None FINDINGS: Uterus Measurements: 15.4 x 9.0 x 11.4 cm = volume: 826.9 mL. Echotexture of the uterus is diffusely inhomogeneous, likely due to diffuse leiomyomatous  change. There is a hypoechoic mass along the superior fundus measuring 9.2 x 6.4 x 6.3 cm, a presumed leiomyoma. 6 mm cervical nabothian cyst seen. Endometrium Thickness: 12 mm.  No focal abnormality visualized. Right ovary Unable to visualize by either transabdominal or transvaginal technique. No right-sided pelvic mass appreciable by ultrasound. Left ovary Unable to visualize by either transabdominal transvaginal technique. No left-sided pelvic mass appreciable by ultrasound. Other findings No abnormal free fluid. IMPRESSION: 1. Enlarged apparently leiomyomatous uterus. Dominant leiomyoma arises from the superior uterine fundus measuring 9.2 x 6.4 x 6.3 cm. 2. Endometrium does not appear thickened for premenopausal state. No endometrial lesion evident. 3.  Subcentimeter cervical nabothian cyst. 4. Neither ovary could be visualized by transabdominal transvaginal assessment. No extrauterine pelvic mass evident. No appreciable free fluid. Electronically Signed   By: Lowella Grip III M.D.   On: 04/19/2021 14:21   Discharge Instructions: Discharge Instructions     Call MD for:  extreme fatigue   Complete by: As directed    Call MD for:  persistant dizziness or light-headedness   Complete by: As directed    Call MD for:  persistant nausea and vomiting   Complete by: As directed    Diet - low sodium heart healthy   Complete by: As directed    Increase activity slowly   Complete by: As directed       It was a pleasure taking care of you during your stay at [facility]. During your stay, you were found to have low blood counts due to possible fibroids and leiomyoma. You were given blood, and started on a medication called Megace to help control your bleeding. Please continue to take your Megace three times daily. Additionally, we will discharge you with an Iron pill. Please take this pill once every other day to replete your iron stores. Also, please call to schedule and appointment with your OBGYN for  a follow up appointment.  We also noted that you had some blood in your urine during your stay. When you have your follow up visit, please inquire about a repeat urinalysis to reassess the blood in your urine. It may be a contaminate, but if it persists, you may need to establish with a urologist (bladder specialist).  If you begin to notice shortness of breath, dizziness/light headedness, heart palpitations, or extreme fatigue, please go to the nearest emergency department to be re-evaluated.   SignedArmando Reichert, MD 04/23/2021, 4:27 PM   Pager: [phone]

## 2021-04-21 LAB — TYPE AND SCREEN
ABO/RH(D): O NEG
Antibody Screen: NEGATIVE
Unit division: 0
Unit division: 0
Unit division: 0

## 2021-04-21 LAB — BPAM RBC
Blood Product Expiration Date: 202205272359
Blood Product Expiration Date: 202205282359
Blood Product Expiration Date: 202206102359
ISSUE DATE / TIME: 202205121418
ISSUE DATE / TIME: 202205130145
ISSUE DATE / TIME: 202205131227
Unit Type and Rh: 9500
Unit Type and Rh: 9500
Unit Type and Rh: 9500

## 2021-04-21 NOTE — Progress Notes (Signed)
Pt discharged, education provided.  Discharge paperwork given and educated pt on follow up appointments, what to report to MD, medications. Iv removed without complications. Immediate needs addressed. Pt wheeled down to [place] side and given a Cab voucher to provide a ride back home.

## 2021-04-26 ENCOUNTER — Telehealth: Payer: Self-pay | Admitting: Internal Medicine

## 2021-04-26 NOTE — Telephone Encounter (Signed)
TOC HFU APPT [facility] FOR 05/10/2021 @ 3:15 PM FOR DR. Wynetta Emery PER DR Linward Headland week follow up for microscopic hematuria

## 2021-04-30 NOTE — Telephone Encounter (Signed)
Transition Care Management Follow-up Telephone Call  Date of discharge and from where: 04/20/21 from [facility]  How have you been since you were released from the hospital? "I am not good"  Any questions or concerns? Yes  Patient states "I have started bleeding again and it is a lot, what do I need to do"?  Pt c/o vaginal bleeding X 3 days, states she is saturating more than 1 pad/hour and passing large clots which she describes as larger than quarter size.  She c/o of fatigue, has been laying down and states she has periods of intermittent dizziness.  Patient states she called Dr. Marjory Lies office approx 30 minutes ago and was advised to go to the ED.  This RN also advised patient to go to the ED for emergent evaluation.  She verbalized understanding.    Transition Care Management f/u telephone call ended.   SChaplin, RN,BSN

## 2021-05-01 ENCOUNTER — Emergency Department (HOSPITAL_COMMUNITY)
Admission: EM | Admit: 2021-05-01 | Discharge: 2021-05-01 | Disposition: A | Discharge status: Home / Self Care | Payer: 59 | Attending: Emergency Medicine | Admitting: Emergency Medicine

## 2021-05-01 ENCOUNTER — Other Ambulatory Visit: Payer: Self-pay

## 2021-05-01 ENCOUNTER — Other Ambulatory Visit (HOSPITAL_COMMUNITY): Payer: Self-pay

## 2021-05-01 DIAGNOSIS — R109 Unspecified abdominal pain: Secondary | ICD-10-CM | POA: Diagnosis not present

## 2021-05-01 DIAGNOSIS — Z91010 Allergy to peanuts: Secondary | ICD-10-CM | POA: Diagnosis not present

## 2021-05-01 DIAGNOSIS — R5383 Other fatigue: Secondary | ICD-10-CM | POA: Diagnosis not present

## 2021-05-01 DIAGNOSIS — Z91040 Latex allergy status: Secondary | ICD-10-CM | POA: Insufficient documentation

## 2021-05-01 DIAGNOSIS — I10 Essential (primary) hypertension: Secondary | ICD-10-CM | POA: Insufficient documentation

## 2021-05-01 DIAGNOSIS — R42 Dizziness and giddiness: Secondary | ICD-10-CM | POA: Insufficient documentation

## 2021-05-01 DIAGNOSIS — F17200 Nicotine dependence, unspecified, uncomplicated: Secondary | ICD-10-CM | POA: Diagnosis not present

## 2021-05-01 DIAGNOSIS — N939 Abnormal uterine and vaginal bleeding, unspecified: Secondary | ICD-10-CM | POA: Insufficient documentation

## 2021-05-01 LAB — TYPE AND SCREEN
ABO/RH(D): O NEG
Antibody Screen: NEGATIVE

## 2021-05-01 LAB — COMPREHENSIVE METABOLIC PANEL
ALT: 9 U/L (ref 0–44)
AST: 18 U/L (ref 15–41)
Albumin: 3.4 g/dL — ABNORMAL LOW (ref 3.5–5.0)
Alkaline Phosphatase: 51 U/L (ref 38–126)
Anion gap: 10 (ref 5–15)
BUN: 5 mg/dL — ABNORMAL LOW (ref 6–20)
CO2: 22 mmol/L (ref 22–32)
Calcium: 9.1 mg/dL (ref 8.9–10.3)
Chloride: 105 mmol/L (ref 98–111)
Creatinine, Ser: 0.89 mg/dL (ref 0.44–1.00)
GFR, Estimated: >60 mL/min (ref >60)
Glucose, Bld: 80 mg/dL (ref 70–99)
Potassium: 3.2 mmol/L — ABNORMAL LOW (ref 3.5–5.1)
Sodium: 137 mmol/L (ref 135–145)
Total Bilirubin: 0.3 mg/dL (ref 0.3–1.2)
Total Protein: 6.8 g/dL (ref 6.5–8.1)

## 2021-05-01 LAB — CBC WITH DIFFERENTIAL/PLATELET
Abs Immature Granulocytes: 0.02 10*3/uL (ref 0.00–0.07)
Basophils Absolute: 0.1 10*3/uL (ref 0.0–0.1)
Basophils Relative: 1 %
Eosinophils Absolute: 0.3 10*3/uL (ref 0.0–0.5)
Eosinophils Relative: 4 %
HCT: 27 % — ABNORMAL LOW (ref 36.0–46.0)
Hemoglobin: 7.9 g/dL — ABNORMAL LOW (ref 12.0–15.0)
Immature Granulocytes: 0 %
Lymphocytes Relative: 20 %
Lymphs Abs: 1.3 10*3/uL (ref 0.7–4.0)
MCH: 21.4 pg — ABNORMAL LOW (ref 26.0–34.0)
MCHC: 29.3 g/dL — ABNORMAL LOW (ref 30.0–36.0)
MCV: 73 fL — ABNORMAL LOW (ref 80.0–100.0)
Monocytes Absolute: 0.3 10*3/uL (ref 0.1–1.0)
Monocytes Relative: 5 %
Neutro Abs: 4.6 10*3/uL (ref 1.7–7.7)
Neutrophils Relative %: 70 %
Platelets: 374 10*3/uL (ref 150–400)
RBC: 3.7 MIL/uL — ABNORMAL LOW (ref 3.87–5.11)
WBC: 6.5 10*3/uL (ref 4.0–10.5)
nRBC: 0 % (ref 0.0–0.2)

## 2021-05-01 LAB — I-STAT BETA HCG BLOOD, ED (MC, WL, AP ONLY): I-stat hCG, quantitative: <5 m[IU]/mL (ref <5)

## 2021-05-01 MED ORDER — FERROUS SULFATE 325 (65 FE) MG PO TABS
325.0000 mg | ORAL_TABLET | ORAL | 0 refills | Status: DC
  Filled 2021-05-01: qty 30, 60d supply, fill #0

## 2021-05-01 MED ORDER — MEGESTROL ACETATE 40 MG PO TABS
40.0000 mg | ORAL_TABLET | Freq: Three times a day (TID) | ORAL | 0 refills | Status: DC
  Filled 2021-05-01: qty 45, 15d supply, fill #0

## 2021-05-01 MED ORDER — ESTROGENS CONJUGATED 25 MG IJ SOLR
25.0000 mg | Freq: Once | INTRAMUSCULAR | Status: AC
  Administered 2021-05-01: 25 mg via INTRAVENOUS
  Filled 2021-05-01: qty 25

## 2021-05-01 MED ORDER — STERILE WATER FOR INJECTION IJ SOLN
INTRAMUSCULAR | Status: AC
  Filled 2021-05-01: qty 10

## 2021-05-01 MED ORDER — POTASSIUM CHLORIDE CRYS ER 20 MEQ PO TBCR
40.0000 meq | EXTENDED_RELEASE_TABLET | Freq: Once | ORAL | Status: AC
  Administered 2021-05-01: 40 meq via ORAL
  Filled 2021-05-01: qty 2

## 2021-05-01 NOTE — Discharge Instructions (Signed)
It was wonderful to see you.  Fortunately your hemoglobin has not dropped too much from a when you are recently discharged.  We gave you Premarin, which is an estrogen, that should hopefully help with your bleeding in addition to the Megace.  Keep using the Megace 3 times daily.  I also encourage that you start taking the ferrous sulfate either every day or every other day.  Be mindful that sometimes it can cause some nausea and can darken your stools.  If you have continued heavy bleeding and starting to feel more short of breath, chest pain, or any passing out episodes please return.  Please make sure you make it to OB/GYN on 6/1.

## 2021-05-01 NOTE — ED Notes (Signed)
Pt provided snacks and ginger ale

## 2021-05-01 NOTE — Discharge Planning (Signed)
RNCM consulted for medication assistance. RNCM reviewed chart and spoke with the pt about [facility] MATCH program ($3 co pay for each Rx through [facility] program, does not include refills, 7 day expiration of [place] letter and choice of pharmacies). Pt is eligible for [facility] MATCH program (unable to find pt listed in PROCARE per cardholder name inquiry) and has agreed to accept [facility] with co-pay override. PROCARE information entered; [facility] to fill and deliver to pt at bedside prior to discharge home today. RNCM updated EDP and ED RN.

## 2021-05-01 NOTE — ED Triage Notes (Signed)
Pt arrived to triage c/o heavy vaginal bleeding. Pt has hx of fibrogen in her uterus and was recently seen for same issue.   Hx of needing transfusion in the past for heavy periods.

## 2021-05-01 NOTE — ED Provider Notes (Signed)
[facility] EMERGENCY DEPARTMENT Provider Note   CSN: [id number] Arrival date & time: 05/01/21  5456     History Chief Complaint  Patient presents with  . Vaginal Bleeding    Anna [name] is a 45 y.o. female, with history of hypertension and fibroids, presenting for evaluation of heavy menstrual bleeding.   She was recently admitted for symptomatic anemia with hemoglobin of 4.8 on 5/12.  Her ultrasound that time showed a large leiomyomatous uterus.  OB/GYN was consulted at that time and recommended starting Megace, she was discharged home with this. Bleeding ceased with this prior to discharge. Received 3 total units of RBCs.  Hemoglobin at discharge 8.5. She has follow-up with Dr. Rip Harbour, [place], on 6/1.  However, patient returns to the ED today due to recurrent heavy vaginal bleeding and clots that started again this past Friday, 5/20.  Bleeding has slowed down a little bit since this morning, still having some intermittent abdominal cramping.  Feeling lightheaded and fatigued again, no dizziness, shortness of breath, syncope, or chest pain. States no where near as bad as a few weeks ago. Using 3 pads at one time and changing every few hours or so.  She has been taking Megace as prescribed.  No previous history of DVT/PE, has not been on a previous contraception.  Not currently sexually active.  Smokes 1 cigarette every several days.  Patient is aware of history of hypertension, she is not taking any medication.  Normally at home will be around systolic 256-389H.    Past Medical History:  Diagnosis Date  . Hypertension     Patient Active Problem List   Diagnosis Date Noted  . Uterine mass 04/20/2021  . Symptomatic anemia 04/20/2021  . Microscopic hematuria 04/20/2021  . Vaginal bleeding 04/19/2021    Past Surgical History:  Procedure Laterality Date  . FRACTURE SURGERY  right ankle     OB History    Gravida  0   Para  0   Term  0   Preterm  0   AB   0   Living        SAB  0   IAB  0   Ectopic  0   Multiple      Live Births              No family history on file.  Social History   Tobacco Use  . Smoking status: Current Some Day Smoker  . Smokeless tobacco: Never Used  Vaping Use  . Vaping Use: Never used  Substance Use Topics  . Alcohol use: Yes    Comment: occassionally, not often  . Drug use: Yes    Types: Cocaine    Home Medications Prior to Admission medications   Medication Sig Start Date End Date Taking? Authorizing Provider  diphenhydrAMINE (BENADRYL) 25 mg capsule Take 1 capsule (25 mg total) by mouth every 6 (six) hours as needed for itching. 04/20/21  Yes Cato Mulligan, MD  hydrocortisone cream 1 % Apply topically 2 (two) times daily. 04/20/21  Yes Cato Mulligan, MD  ferrous sulfate 325 (65 FE) MG tablet Take 1 tablet (325 mg total) by mouth every other day. 05/01/21 06/30/21  Patriciaann Clan, DO  megestrol (MEGACE) 40 MG tablet Take 1 tablet (40 mg total) by mouth 3 (three) times daily. 05/01/21   Patriciaann Clan, DO    Allergies    Latex and Peanut-containing drug products  Review of Systems   Review of Systems  Constitutional: Positive for fatigue. Negative for fever.  HENT: Negative for congestion.   Respiratory: Negative for cough, chest tightness and shortness of breath.   Cardiovascular: Negative for chest pain and palpitations.  Gastrointestinal: Positive for abdominal pain. Negative for abdominal distention.  Endocrine: Positive for cold intolerance.  Genitourinary: Negative for difficulty urinating and dysuria.  Musculoskeletal: Negative for gait problem.  Skin: Negative for pallor and rash.  Neurological: Positive for light-headedness. Negative for dizziness, syncope, weakness and headaches.  Psychiatric/Behavioral: Negative for behavioral problems.    Physical Exam Updated Vital Signs BP (!) 147/96   Pulse 68   Temp 97.9 F (36.6 C) (Oral)   Resp 18   LMP 04/08/2021    SpO2 100%   Physical Exam Constitutional:      General: She is not in acute distress.    Appearance: Normal appearance.     Comments: Appears tired   HENT:     Head: Normocephalic and atraumatic.     Mouth/Throat:     Mouth: Mucous membranes are moist.  Eyes:     Extraocular Movements: Extraocular movements intact.  Cardiovascular:     Rate and Rhythm: Normal rate and regular rhythm.     Pulses: Normal pulses.     Heart sounds: No murmur heard.   Pulmonary:     Effort: Pulmonary effort is normal.     Breath sounds: Normal breath sounds.  Abdominal:     General: Bowel sounds are normal.     Palpations: Abdomen is soft.     Comments: Tenderness with voluntary guarding to uterus, enlarged uterus with palpation.   Genitourinary:    Comments: Pelvic exam chaperoned by RN. Normal external female genitalia. Pink vaginal rugae with small amount of blood pooling in vaginal vault.  No active bleeding noted from cervix with coughing or pressure on superior uterus.  No clots or vaginal/cervical lesions.  Cervix appears normal. Neurological:     Mental Status: She is alert.     ED Results / Procedures / Treatments   Labs (all labs ordered are listed, but only abnormal results are displayed) Labs Reviewed  CBC WITH DIFFERENTIAL/PLATELET - Abnormal; Notable for the following components:      Result Value   RBC 3.70 (*)    Hemoglobin 7.9 (*)    HCT 27.0 (*)    MCV 73.0 (*)    MCH 21.4 (*)    MCHC 29.3 (*)    All other components within normal limits  COMPREHENSIVE METABOLIC PANEL - Abnormal; Notable for the following components:   Potassium 3.2 (*)    BUN 5 (*)    Albumin 3.4 (*)    All other components within normal limits  I-STAT BETA HCG BLOOD, ED ([facility], [facility], [facility] ONLY)  TYPE AND SCREEN    EKG None  Radiology No results found.  Procedures Procedures   Medications Ordered in ED Medications  conjugated estrogens (PREMARIN) injection 25 mg (25 mg Intravenous Given  05/01/21 1430)  potassium chloride SA (KLOR-CON) CR tablet 40 mEq (40 mEq Oral Given 05/01/21 1328)  sterile water (preservative free) injection (  Given 05/01/21 1444)    ED Course  I have reviewed the triage vital signs and the nursing notes.  Pertinent labs & imaging results that were available during my care of the patient were reviewed by me and considered in my medical decision making (see chart for details).  Clinical Course as of 05/01/21 1527  Tue May 01, 2021  1059 Spoke with Dr.  Roselie Awkward with OB/GYN.  Recommended giving one-time IV dose of Premarin 25 mg and then following up with Dr. Rip Harbour as scheduled. [SB]    Clinical Course User Index [SB] Patriciaann Clan, DO   MDM Rules/Calculators/A&P                          45 year old female, with a known enlarged leiomyomatous uterus and abnormal uterine bleeding currently on Megace TID since 5/13, presenting for evaluation of recurrent heavy vaginal bleeding after recent admission for symptomatic anemia.  Hemoglobin 7.9, from 8.5 on 5/13.  Otherwise hemodynamically stable, no significant active bleeding from cervical os or any additional cervical/vaginal lesions on exam.  Spoke with OB/GYN who recommended a one-time IV Premarin 25 mg to control bleeding, in addition to continuing Megace.  Premarin given and Megace refilled to [place] for medication financial assistance.  Additionally sent in ferrous sulfate as she had not picked this up from the pharmacy yet to start daily.  Given kdur 40 meq for mild hypokalemia (3.2). Follow-up with OB/GYN already scheduled for 6/1.   Final Clinical Impression(s) / ED Diagnoses Final diagnoses:  Abnormal uterine bleeding (AUB)    Rx / DC Orders ED Discharge Orders         Ordered    ferrous sulfate 325 (65 FE) MG tablet  Every other day        05/01/21 1355    megestrol (MEGACE) 40 MG tablet  3 times daily        05/01/21 [place], [place], DO 05/01/21 1528     Blanchie Dessert, MD 05/02/21 [phone]

## 2021-05-01 NOTE — ED Provider Notes (Incomplete)
Patient is a 45 year old female presenting today with recurrent vaginal bleeding.  Patient recently admitted due to heavy vaginal bleeding from a large fibroid resulting in hemoglobin of 4.  Patient did have transfusion with hemoglobin up to 8 and was sent home on Megace with initial resolution of bleeding but then 5 days ago bleeding started again and has been fairly heavy.  Particularly heavy yesterday slightly lighter today but now starting to feel lightheaded and tired again.  She has an appointment with Dr. Geralyn Flash on June 1 and continues to take iron and Megace at home.  She is having intermittent abdominal cramping but no localized significant abdominal pain at this time.  Hypertensive here but otherwise vital signs are normal.  We will recheck hemoglobin and discuss with GYN.

## 2021-05-03 ENCOUNTER — Encounter (HOSPITAL_COMMUNITY): Payer: Self-pay | Admitting: Emergency Medicine

## 2021-05-03 ENCOUNTER — Other Ambulatory Visit: Payer: Self-pay

## 2021-05-03 ENCOUNTER — Ambulatory Visit (HOSPITAL_COMMUNITY)
Admission: EM | Admit: 2021-05-03 | Discharge: 2021-05-03 | Disposition: A | Discharge status: Home / Self Care | Payer: Medicaid Other | Attending: Family Medicine | Admitting: Family Medicine

## 2021-05-03 DIAGNOSIS — L209 Atopic dermatitis, unspecified: Secondary | ICD-10-CM

## 2021-05-03 MED ORDER — METHYLPREDNISOLONE SODIUM SUCC 125 MG IJ SOLR
INTRAMUSCULAR | Status: AC
  Filled 2021-05-03: qty 2

## 2021-05-03 MED ORDER — PREDNISONE 20 MG PO TABS: 20.0000 mg | ORAL_TABLET | Freq: Every day | ORAL | 0 refills | Status: AC

## 2021-05-03 MED ORDER — METHYLPREDNISOLONE SODIUM SUCC 125 MG IJ SOLR
125.0000 mg | Freq: Once | INTRAMUSCULAR | Status: AC
  Administered 2021-05-03: 125 mg via INTRAMUSCULAR

## 2021-05-03 MED ORDER — TRIAMCINOLONE ACETONIDE 0.025 % EX OINT: 1.0000 "application " | TOPICAL_OINTMENT | Freq: Three times a day (TID) | CUTANEOUS | 0 refills | Status: DC

## 2021-05-03 NOTE — ED Triage Notes (Signed)
Rash for a month on left chest for a month, rash itches.  Rash on right arm and legs, back

## 2021-05-03 NOTE — Discharge Instructions (Addendum)
Start triamcinolone cream and apply up to 3 times daily to affected areas in which the rash is present.  Start oral prednisone tomorrow take with food and take in the morning time.  Use lotions or creams to keep skin from drying as this can cause development of eczema type rashes. Follow-up with primary care as needed.

## 2021-05-03 NOTE — ED Provider Notes (Signed)
[name]    CSN: [id number] Arrival date & time: 05/03/21  0941      History   Chief Complaint Chief Complaint  Patient presents with  . Rash    HPI Anna [name] is a 45 y.o. female.   HPI  Patient presents today with a generalized maculopapular type rash.  Rash has been present for greater than a month.  She is now having areas of excoriation from scratching.  She has tried hydrocortisone cream without relief.  She denies a knowledge of having eczema however reports that she frequently gets eruptions of rashes and has for a long time.  She denies any changes in cosmetics, soaps or detergents.  Past Medical History:  Diagnosis Date  . Hypertension     Patient Active Problem List   Diagnosis Date Noted  . Uterine mass 04/20/2021  . Symptomatic anemia 04/20/2021  . Microscopic hematuria 04/20/2021  . Vaginal bleeding 04/19/2021    Past Surgical History:  Procedure Laterality Date  . FRACTURE SURGERY  right ankle    OB History    Gravida  0   Para  0   Term  0   Preterm  0   AB  0   Living        SAB  0   IAB  0   Ectopic  0   Multiple      Live Births               Home Medications    Prior to Admission medications   Medication Sig Start Date End Date Taking? Authorizing Provider  diphenhydrAMINE (BENADRYL) 25 mg capsule Take 1 capsule (25 mg total) by mouth every 6 (six) hours as needed for itching. 04/20/21  Yes Cato Mulligan, MD  ferrous sulfate 325 (65 FE) MG tablet Take 1 tablet (325 mg total) by mouth every other day. 05/01/21 06/30/21 Yes Beard, Ralph Leyden, DO  megestrol (MEGACE) 40 MG tablet Take 1 tablet (40 mg total) by mouth 3 (three) times daily. 05/01/21  Yes Darrelyn Hillock N, DO  hydrocortisone cream 1 % Apply topically 2 (two) times daily. Patient not taking: Reported on 05/03/2021 04/20/21   Cato Mulligan, MD    Family History Family History  Problem Relation Age of Onset  . Healthy Mother   . Healthy  Father     Social History Social History   Tobacco Use  . Smoking status: Current Some Day Smoker  . Smokeless tobacco: Never Used  Vaping Use  . Vaping Use: Never used  Substance Use Topics  . Alcohol use: Yes    Comment: occassionally, not often  . Drug use: Yes    Types: Cocaine     Allergies   Latex and Peanut-containing drug products   Review of Systems Review of Systems Pertinent negatives listed in HPI  Physical Exam Triage Vital Signs ED Triage Vitals  Enc Vitals Group     BP 05/03/21 1001 (!) 147/87     Pulse Rate 05/03/21 1001 93     Resp 05/03/21 1001 18     Temp 05/03/21 1001 99.2 F (37.3 C)     Temp Source 05/03/21 1001 Oral     SpO2 05/03/21 1001 100 %     Weight --      Height --      Head Circumference --      Peak Flow --      Pain Score 05/03/21 0957 10     Pain  Loc --      Pain Edu? --      Excl. in [place]? --    No data found.  Updated Vital Signs BP (!) 147/87 (BP Location: Right Arm)   Pulse 93   Temp 99.2 F (37.3 C) (Oral)   Resp 18   LMP 04/08/2021   SpO2 100%   Visual Acuity Right Eye Distance:   Left Eye Distance:   Bilateral Distance:    Right Eye Near:   Left Eye Near:    Bilateral Near:     Physical Exam General appearance: Alert, well developed, well nourished, cooperative Head: Normocephalic, without obvious abnormality, atraumatic Respiratory: Respirations even and unlabored, normal respiratory rate Heart: rate and rhythm normal. No gallop or murmurs noted on exam  Abdomen: BS +, no distention, no rebound tenderness, or no mass Extremities: No gross deformities Skin: Maculopapular rash present on torso bilateral upper extremities and bilateral lower extremities  Psych: Appropriate mood and affect.  UC Treatments / Results  Labs (all labs ordered are listed, but only abnormal results are displayed) Labs Reviewed - No data to display  EKG   Radiology No results found.  Procedures Procedures (including  critical care time)  Medications Ordered in UC Medications - No data to display  Initial Impression / Assessment and Plan / UC Course  I have reviewed the triage vital signs and the nursing notes.  Pertinent labs & imaging results that were available during my care of the patient were reviewed by me and considered in my medical decision making (see chart for details).     Atopic dermatitis treatment today in clinic with Solu-Medrol.  Patient will start oral prednisone tomorrow.  Also prescribed triamcinolone cream to be applied 3 times daily as needed.  Encouraged to keep skin moisturized as dry skin will precipitate another flare.  Follow-up PCP as needed  Final Clinical Impressions(s) / UC Diagnoses   Final diagnoses:  Atopic dermatitis, unspecified type     Discharge Instructions     Start triamcinolone cream and apply up to 3 times daily to affected areas in which the rash is present.  Start oral prednisone tomorrow take with food and take in the morning time.  Use lotions or creams to keep skin from drying as this can cause development of eczema type rashes. Follow-up with primary care as needed.    ED Prescriptions    Medication Sig Dispense Auth. Provider   predniSONE (DELTASONE) 20 MG tablet Take 1 tablet (20 mg total) by mouth daily with breakfast for 5 days. 5 tablet Scot Jun, FNP   triamcinolone (KENALOG) 0.025 % ointment Apply 1 application topically 3 (three) times daily. 454 g Scot Jun, FNP     PDMP not reviewed this encounter.   Scot Jun, FNP 05/03/21 1113

## 2021-05-09 ENCOUNTER — Other Ambulatory Visit: Payer: Self-pay

## 2021-05-09 ENCOUNTER — Ambulatory Visit (INDEPENDENT_AMBULATORY_CARE_PROVIDER_SITE_OTHER): Payer: 59 | Admitting: Obstetrics and Gynecology

## 2021-05-09 ENCOUNTER — Other Ambulatory Visit (HOSPITAL_COMMUNITY)
Admission: RE | Admit: 2021-05-09 | Discharge: 2021-05-09 | Disposition: A | Discharge status: Home / Self Care | Payer: 59 | Source: Ambulatory Visit | Attending: Obstetrics and Gynecology | Admitting: Obstetrics and Gynecology

## 2021-05-09 ENCOUNTER — Encounter: Payer: Self-pay | Admitting: Obstetrics and Gynecology

## 2021-05-09 VITALS — BP 132/77 | HR 68 | Ht 67.5 in | Wt 175.8 lb

## 2021-05-09 DIAGNOSIS — D219 Benign neoplasm of connective and other soft tissue, unspecified: Secondary | ICD-10-CM | POA: Insufficient documentation

## 2021-05-09 DIAGNOSIS — Z0000 Encounter for general adult medical examination without abnormal findings: Secondary | ICD-10-CM | POA: Diagnosis not present

## 2021-05-09 DIAGNOSIS — Z01419 Encounter for gynecological examination (general) (routine) without abnormal findings: Secondary | ICD-10-CM | POA: Insufficient documentation

## 2021-05-09 DIAGNOSIS — N920 Excessive and frequent menstruation with regular cycle: Secondary | ICD-10-CM | POA: Insufficient documentation

## 2021-05-09 DIAGNOSIS — Z01411 Encounter for gynecological examination (general) (routine) with abnormal findings: Secondary | ICD-10-CM

## 2021-05-09 DIAGNOSIS — F418 Other specified anxiety disorders: Secondary | ICD-10-CM

## 2021-05-09 DIAGNOSIS — Z5941 Food insecurity: Secondary | ICD-10-CM

## 2021-05-09 MED ORDER — MEGESTROL ACETATE 40 MG PO TABS: 40.0000 mg | ORAL_TABLET | Freq: Three times a day (TID) | ORAL | 3 refills | Status: DC

## 2021-05-09 NOTE — Progress Notes (Signed)
Pt has elevated PHQ-9 and GAD-7 today. After speaking with pt, I placed a referral. I also told her about the [facility] urgent care next door, she is agreeable and aware where the [facility] urgent care is located. Pt's mother is with her today.   MMG scheduled for  6/3 at 1050 pm   at the [facility] Pt aware and agreeable to date and time.

## 2021-05-09 NOTE — Addendum Note (Signed)
Addended by: Bethanne Ginger on: 05/09/2021 10:46 AM   Modules accepted: Orders

## 2021-05-09 NOTE — Patient Instructions (Signed)
Abdominal Hysterectomy, Care After The following information offers guidance on how to care for yourself after your procedure. Your doctor may also give you more specific instructions. If you have problems or questions, contact your doctor. What can I expect after the procedure? After the procedure, it is common to have:  Pain.  Tiredness.  No desire to eat.  Less interest in sex.  Bleeding and fluid (discharge) from your vagina. You may need to use a pad after this procedure.  Trouble pooping (constipation).  Feelings of sadness or other emotions. Follow these instructions at home: Medicines  Take over-the-counter and prescription medicines only as told by your doctor.  Do not take aspirin or NSAIDs, such as ibuprofen. These medicines can cause bleeding.  If you were prescribed an antibiotic medicine, take it as told by your doctor. Do not stop using the antibiotic even if you start to feel better.  If told, take steps to prevent problems with pooping (constipation). You may need to: ? Drink enough fluid to keep your pee (urine) pale yellow. ? Take medicines. You will be told what medicines to take. ? Eat foods that are high in fiber. These include beans, whole grains, and fresh fruits and vegetables. ? Limit foods that are high in fat and sugar. These include fried or sweet foods.  Ask your doctor if you should avoid driving or using machines while you are taking your medicine. Surgical cut care  Follow instructions from your doctor about how to take care of your cut from surgery (incision). Make sure you: ? Wash your hands with soap and water for at least 20 seconds before and after you change your bandage. If you cannot use soap and water, use hand sanitizer. ? Change your bandage. ? Leave stitches or skin glue in place for at least two weeks. ? Leave tape strips alone unless you are told to take them off. You may trim the edges of the tape strips if they curl up.  Keep  the bandage dry until your doctor says it can be taken off.  Check your incision every day for signs of infection. Check for: ? More redness, swelling, or pain. ? Fluid or blood. ? Warmth. ? Pus or a bad smell.   Activity  Rest as told by your doctor.  Get up to take short walks every 1 to 2 hours. Ask for help if you feel weak or unsteady.  Do not lift anything that is heavier than 10 lb (4.5 kg), or the limit that you are told.  Follow your doctor's advice about exercise, driving, and general activities.  Return to your normal activities when your doctor says that it is safe.   Lifestyle  Do not douche, use tampons, or have sex for at least 6 weeks or as told by your doctor.  Do not drink alcohol until your doctor says it is okay.  Do not smoke or use any products that contain nicotine or tobacco. These can delay healing after surgery. If you need help quitting, ask your doctor. General instructions  Do not take baths, swim, or use a hot tub. Ask your doctor about taking showers or sponge baths.  Try to have a responsible adult at home with you for the first 1-2 weeks to help with your daily chores.  Wear tight-fitting (compression) stockings as told by your doctor.  Keep all follow-up visits. Contact a doctor if:  You have chills or a fever.  You have any of these signs  of infection around your cut: ? More redness, swelling, or pain. ? Fluid or blood. ? Warmth. ? Pus or a bad smell.  Your cut breaks open.  You feel dizzy or light-headed.  You have pain or bleeding when you pee.  You keep having watery poop (diarrhea).  You keep feeling like you may vomit or you keep vomiting.  You have fluid coming from your vagina that is not normal.  You have any type of reaction to your medicine that is not normal, like a rash, or you develop an allergy to your medicine.  Your pain medicine does not help. Get help right away if:  You have a fever and your symptoms  get worse suddenly.  You have very bad pain in your belly (abdomen).  You are short of breath.  You faint.  You have pain, swelling, or redness of your leg.  You bleed a lot from your vagina and you see blood clots. Summary  It is normal to have some pain, tiredness, and fluid that comes from your vagina.  Do not take baths, swim, or use a hot tub. Ask your doctor about taking showers or sponge baths.  Do not lift anything that is heavier than 10 lb (4.5 kg), or the limit that you are told.  Follow your doctor's advice about exercise, driving, and general activities.  Try to have a responsible adult at home with you for the first 1-2 weeks to help with your daily chores. This information is not intended to replace advice given to you by your health care provider. Make sure you discuss any questions you have with your health care provider. Document Revised: 07/27/2020 Document Reviewed: 07/27/2020 Elsevier Patient Education  2021 [employer]. Hysterectomy Information  A hysterectomy is a surgery to take out the womb (uterus) or the womb and the lowest part of the womb (cervix), which opens into the vagina. The ovaries, the fallopian tubes, or both may also be taken out. After the procedure, a woman will no longer have menstrual periods and will not be able to get pregnant. What are the benefits? This surgery may improve your quality of life by relieving pain and heavy vaginal bleeding. This surgery may also:  Treat long-term (chronic) infection in the area between your hip bones (pelvis).  Treat conditions that affect the womb.  Treat cancer of the womb or cervix.  Lower the risk of cancer. It may also be done for transgender men to match their gender identity. What are the risks? Generally, this surgery is safe. But problems can happen, including:  Bleeding.  Needing donated blood (transfusion).  Blood clots.  Infection.  Damage to nearby parts or  organs.  Allergic reactions to medicines.  Needing to switch from a surgery that requires small cuts (incisions) to a surgery that requires a large cut. What are the different types? These are the three types:  The top part of the womb is taken out, but not the cervix.  The womb and cervix are taken out.  The womb, the cervix, and the tissue that holds the womb in place are taken out. What happens during the procedure? This surgery may be done in one of these ways:  A cut is made in the belly (abdomen). The womb is taken out through the opening.  A cut is made in the vagina. The womb is taken out through the opening that is made.  A device with a camera to help see is put through one  of 3 or 4 cuts made in the belly. The womb is taken out through the vagina.  A device with a camera to help see is put through one of 3 or 4 cuts made in the belly. The womb is cut into pieces and taken out through the openings or through the vagina.  A computer helps control the surgical tools put into 3 or 4 cuts made in the belly. The womb is cut into small pieces. The pieces are taken out through the openings or through the vagina. Talk with your doctor to see which is the right one for you. The procedures may vary among doctors and hospitals. What happens after the procedure?  You will be given pain medicine.  You may need to stay in the hospital for 1-2 days.  You may need to have a responsible adult stay with you for a few days after you go home.  If your ovaries were taken out, you may get hot flashes, have night sweats, and have trouble sleeping.  Talk with your doctor about how often you need Pap tests.  Keep all follow-up visits. Questions to ask your doctor  Is this surgery needed? What other options do I have?  What organs need to be taken out?  How long will I need to stay in the hospital?  How long will it take to get better at home?  What symptoms can I expect after the  procedure? Summary  A hysterectomy is a surgery to take out your womb.  This may be done to treat conditions.  After this surgery, you will not have periods and you cannot get pregnant.  There are different types of this surgery. Talk with your doctor about which is best for you.  This surgery is safe, but there are some risks. This information is not intended to replace advice given to you by your health care provider. Make sure you discuss any questions you have with your health care provider. Document Revised: 09/20/2020 Document Reviewed: 09/20/2020 Elsevier Patient Education  2021 [employer]. Uterine Fibroids  Uterine fibroids are lumps of tissue (tumors) in the womb (uterus). Fibroids are not cancerous. Most women with this condition do not need treatment. Sometimes, fibroids can make it harder to have children. If this happens, you may need surgery to take out the fibroids. What are the causes? The cause of this condition is not known. What increases the risk?  You are in your 30s or 40s and have not gone through menopause. Menopause is when you have not had a menstrual period for 12 months.  Having a history of fibroids in your family.  You are of African American descent.  You started your period at age 75 or younger.  You have not given birth.  You are overweight or very overweight. What are the signs or symptoms?  Bleeding between menstrual periods.  Heavy bleeding during your menstrual period.  Pain in the area between your hips.  Needing to pee (urinate) right away or more often than usual.  Not being able to have children (infertility).  Not being able to stay pregnant (miscarriage). Many women do not have symptoms.  How is this treated? Treatment may include:  Follow-up visits with your doctor to check your fibroids for any changes.  Medicines to help with pain, such as aspirin or ibuprofen.  Hormone therapy. This may be given as a pill, in a  shot, or with a type of birth control device called an IUD.  Surgery that would do one of these things: ? Take out the fibroids. This may be done if you want to become pregnant. ? Take out the womb (hysterectomy). ? Stop the blood flow to the fibroids. Follow these instructions at home: Medicines  Take over-the-counter and prescription medicines only as told by your doctor.  Ask your doctor if you should: ? Take iron pills. ? Eat more foods that have a lot of iron in them, such as dark Fite, leafy vegetables. Managing pain If told, put heat on your back or belly. Do this as often as told by your doctor. Use the heat source that your doctor recommends, such as a moist heat pack or a heating pad. To do this:  Put a towel between your skin and the heat pack or pad.  Leave the heat on for 20-30 minutes.  Take off the heat if your skin turns bright red. This is very important. If you cannot feel pain, heat, or cold, you may have a greater risk of getting burned.   General instructions  Tell your doctor about any changes to your menstrual period, such as: ? Heavy bleeding that needs a change of tampons or pads more than normal. ? A change in how many days your period lasts. ? A change in symptoms that come with your period. This might be belly cramps or back pain.  Keep all follow-up visits. Contact a doctor if:  You have pain that does not get better with medicine or heat. This may include pain or cramps in: ? The area between your hip bones. ? Your back. ? Your belly.  You have new bleeding between your periods.  You have more bleeding during or between your periods.  You feel very tired or weak.  You feel dizzy. Get help right away if:  You faint.  You have pain in the area between your hip bones that gets worse.  You have bleeding that soaks a tampon or pad in 30 minutes or less. Summary  Uterine fibroids are lumps of tissue (tumors) in your womb. They are not  cancerous.  Medicines such as aspirin or ibuprofen may be used to help with pain.  Contact a doctor if you have pain or cramps that do not get better with medicine.  Know the symptoms for when you should get help right away. This information is not intended to replace advice given to you by your health care provider. Make sure you discuss any questions you have with your health care provider. Document Revised: 06/27/2020 Document Reviewed: 06/27/2020 Elsevier Patient Education  [place] Maintenance, Female Adopting a healthy lifestyle and getting preventive care are important in promoting health and wellness. Ask your health care provider about:  The right schedule for you to have regular tests and exams.  Things you can do on your own to prevent diseases and keep yourself healthy. What should I know about diet, weight, and exercise? Eat a healthy diet  Eat a diet that includes plenty of vegetables, fruits, low-fat dairy products, and lean protein.  Do not eat a lot of foods that are high in solid fats, added sugars, or sodium.   Maintain a healthy weight Body mass index (BMI) is used to identify weight problems. It estimates body fat based on height and weight. Your health care provider can help determine your BMI and help you achieve or maintain a healthy weight. Get regular exercise Get regular exercise. This is one of the most  important things you can do for your health. Most adults should:  Exercise for at least 150 minutes each week. The exercise should increase your heart rate and make you sweat (moderate-intensity exercise).  Do strengthening exercises at least twice a week. This is in addition to the moderate-intensity exercise.  Spend less time sitting. Even light physical activity can be beneficial. Watch cholesterol and blood lipids Have your blood tested for lipids and cholesterol at 47 years of age, then have this test every 5 years. Have your  cholesterol levels checked more often if:  Your lipid or cholesterol levels are high.  You are older than 47 years of age.  You are at high risk for heart disease. What should I know about cancer screening? Depending on your health history and family history, you may need to have cancer screening at various ages. This may include screening for:  Breast cancer.  Cervical cancer.  Colorectal cancer.  Skin cancer.  Lung cancer. What should I know about heart disease, diabetes, and high blood pressure? Blood pressure and heart disease  High blood pressure causes heart disease and increases the risk of stroke. This is more likely to develop in people who have high blood pressure readings, are of African descent, or are overweight.  Have your blood pressure checked: ? Every 3-5 years if you are 66-15 years of age. ? Every year if you are 64 years old or older. Diabetes Have regular diabetes screenings. This checks your fasting blood sugar level. Have the screening done:  Once every three years after age 63 if you are at a normal weight and have a low risk for diabetes.  More often and at a younger age if you are overweight or have a high risk for diabetes. What should I know about preventing infection? Hepatitis B If you have a higher risk for hepatitis B, you should be screened for this virus. Talk with your health care provider to find out if you are at risk for hepatitis B infection. Hepatitis C Testing is recommended for:  Everyone born from [birth year] through 1967.  Anyone with known risk factors for hepatitis C. Sexually transmitted infections (STIs)  Get screened for STIs, including gonorrhea and chlamydia, if: ? You are sexually active and are younger than 47 years of age. ? You are older than 47 years of age and your health care provider tells you that you are at risk for this type of infection. ? Your sexual activity has changed since you were last screened, and you are  at increased risk for chlamydia or gonorrhea. Ask your health care provider if you are at risk.  Ask your health care provider about whether you are at high risk for HIV. Your health care provider may recommend a prescription medicine to help prevent HIV infection. If you choose to take medicine to prevent HIV, you should first get tested for HIV. You should then be tested every 3 months for as long as you are taking the medicine. Pregnancy  If you are about to stop having your period (premenopausal) and you may become pregnant, seek counseling before you get pregnant.  Take 400 to 800 micrograms (mcg) of folic acid every day if you become pregnant.  Ask for birth control (contraception) if you want to prevent pregnancy. Osteoporosis and menopause Osteoporosis is a disease in which the bones lose minerals and strength with aging. This can result in bone fractures. If you are 47 years old or older, or if you  are at risk for osteoporosis and fractures, ask your health care provider if you should:  Be screened for bone loss.  Take a calcium or vitamin D supplement to lower your risk of fractures.  Be given hormone replacement therapy (HRT) to treat symptoms of menopause. Follow these instructions at home: Lifestyle  Do not use any products that contain nicotine or tobacco, such as cigarettes, e-cigarettes, and chewing tobacco. If you need help quitting, ask your health care provider.  Do not use street drugs.  Do not share needles.  Ask your health care provider for help if you need support or information about quitting drugs. Alcohol use  Do not drink alcohol if: ? Your health care provider tells you not to drink. ? You are pregnant, may be pregnant, or are planning to become pregnant.  If you drink alcohol: ? Limit how much you use to 0-1 drink a day. ? Limit intake if you are breastfeeding.  Be aware of how much alcohol is in your drink. In the [place] one drink equals one 12 oz  bottle of beer (355 mL), one 5 oz glass of wine (148 mL), or one 1 oz glass of hard liquor (44 mL). General instructions  Schedule regular health, dental, and eye exams.  Stay current with your vaccines.  Tell your health care provider if: ? You often feel depressed. ? You have ever been abused or do not feel safe at home. Summary  Adopting a healthy lifestyle and getting preventive care are important in promoting health and wellness.  Follow your health care provider's instructions about healthy diet, exercising, and getting tested or screened for diseases.  Follow your health care provider's instructions on monitoring your cholesterol and blood pressure. This information is not intended to replace advice given to you by your health care provider. Make sure you discuss any questions you have with your health care provider. Document Revised: 11/18/2018 Document Reviewed: 11/18/2018 Elsevier Patient Education  2021 [employer].

## 2021-05-09 NOTE — Progress Notes (Signed)
Patient ID: Anna [name], female   DOB: [birth year], 46 y.o.   MRN: [id number]  Anna [name] is a 45 y.o. [id number] female here for a routine annual gynecologic exam.  Current complaints: vaginal bleeding and uterine fibroids. Was hospitalized last month secondary to anemia related to her cycles. U/S and CT scan confirmed fibroids. Started on Megace and bleeding has stopped.      Gynecologic History Patient's last menstrual period was 04/26/2021 (exact date). Contraception: none Last Pap: Uncertain. Results were: normal Last mammogram: unceretain. Results were: normal  Obstetric History OB History  Gravida Para Term Preterm AB Living  6 3 3  0 3 3  SAB IAB Ectopic Multiple Live Births  0 3 0 0 3    # Outcome Date GA Lbr Len/2nd Weight Sex Delivery Anes PTL Lv  6 Term 12/15/95    M Vag-Spont   LIV  5 Term 10/06/91    F Vag-Spont   LIV  4 Term 04/07/90    M Vag-Spont   LIV  3 IAB           2 IAB           1 IAB             Past Medical History:  Diagnosis Date  . Hypertension    only elevated at MD office per pt. no meds    Past Surgical History:  Procedure Laterality Date  . FRACTURE SURGERY  right ankle    Current Outpatient Medications on File Prior to Visit  Medication Sig Dispense Refill  . ferrous sulfate 325 (65 FE) MG tablet Take 1 tablet (325 mg total) by mouth every other day. 30 tablet 0  . megestrol (MEGACE) 40 MG tablet Take 1 tablet (40 mg total) by mouth 3 (three) times daily. 45 tablet 0  . triamcinolone (KENALOG) 0.025 % ointment Apply 1 application topically 3 (three) times daily. 454 g 0   No current facility-administered medications on file prior to visit.    Allergies  Allergen Reactions  . Latex Hives and Swelling  . Peanut-Containing Drug Products Itching    Social History   Socioeconomic History  . Marital status: Legally Separated    Spouse name: Not on file  . Number of children: Not on file  . Years of education: Not on file  . Highest  education level: Not on file  Occupational History  . Not on file  Tobacco Use  . Smoking status: Former Research scientist (life sciences)  . Smokeless tobacco: Never Used  . Tobacco comment: pt states quit 1-2 years ago   Vaping Use  . Vaping Use: Never used  Substance and Sexual Activity  . Alcohol use: Yes    Comment: occassionally, not often  . Drug use: Yes    Types: Cocaine  . Sexual activity: Not Currently    Birth control/protection: None  Other Topics Concern  . Not on file  Social History Narrative   ** Merged History Encounter **       Social Determinants of Health   Financial Resource Strain: Not on file  Food Insecurity: Food Insecurity Present  . Worried About Charity fundraiser in the Last Year: Sometimes true  . Ran Out of Food in the Last Year: Sometimes true  Transportation Needs: Unmet Transportation Needs  . Lack of Transportation (Medical): Yes  . Lack of Transportation (Non-Medical): Yes  Physical Activity: Not on file  Stress: Not on file  Social Connections: Not on file  Intimate  Partner Violence: Not on file    Family History  Problem Relation Age of Onset  . Healthy Mother   . Healthy Father     The following portions of the patient's history were reviewed and updated as appropriate: allergies, current medications, past family history, past medical history, past social history, past surgical history and problem list.  Review of Systems Pertinent items noted in HPI and remainder of comprehensive ROS otherwise negative.   Objective:  BP 132/77   Pulse 68   Ht 5' 7.5" (1.715 m)   Wt 175 lb 12.8 oz (79.7 kg)   LMP 04/26/2021 (Exact Date)   BMI 27.13 kg/m  CONSTITUTIONAL: Well-developed, well-nourished female in no acute distress.  HENT:  Normocephalic, atraumatic, External right and left ear normal. Oropharynx is clear and moist EYES: Conjunctivae and EOM are normal. Pupils are equal, round, and reactive to light. No scleral icterus.  NECK: Normal range of  motion, supple, no masses.  Normal thyroid.  SKIN: Skin is warm and dry. No rash noted. Not diaphoretic. No erythema. No pallor. [place]: Alert and oriented to person, place, and time. Normal reflexes, muscle tone coordination. No cranial nerve deficit noted. PSYCHIATRIC: Normal mood and affect. Normal behavior. Normal judgment and thought content. CARDIOVASCULAR: Normal heart rate noted, regular rhythm RESPIRATORY: Clear to auscultation bilaterally. Effort and breath sounds normal, no problems with respiration noted. BREASTS: Symmetric in size. No masses, skin changes, nipple drainage, or lymphadenopathy. ABDOMEN: Soft, normal bowel sounds, pelvic mass affect to umbilicus.  PELVIC: Normal appearing external genitalia; normal appearing vaginal mucosa and cervix.  No abnormal discharge noted.  Pap smear obtained. Enlarged uterus 18-20 weeks, irregular contour, MUSCULOSKELETAL: Normal range of motion. No tenderness.  No cyanosis, clubbing, or edema.  2+ distal pulses.   Assessment:  Annual gynecologic examination with pap smear  Uterine fibroids Plan:  Will follow up results of pap smear and manage accordingly. Mammogram scheduled Treatment of uterine fibroids reviewed with pt. Abd hyst recommended and reviewed with pt. Information provided to pt. Hysterotomy papers signed today.  Will schedule. Continue with Megace until  Routine preventative health maintenance measures emphasized. Please refer to After Visit Summary for other counseling recommendations.    [name], MD, [facility] Attending [place] for [facility], [place]

## 2021-05-10 ENCOUNTER — Ambulatory Visit: Payer: Medicaid Other | Admitting: Student

## 2021-05-10 NOTE — BH Specialist Note (Signed)
Integrated Behavioral Health via Telemedicine Visit  05/10/2021 Anna [name] [id number]  Number of [place] visits: 1 Session Start time: 10:23  Session End time: 11:03 Total time:  40  Referring Provider: Arlina Robes, MD Patient/Family location: Home [facility] Provider location: [facility] for [place] [facility]  All persons participating in visit: Patient Anna [name] and [place]  Types of Service: Individual psychotherapy and Video visit  I connected with Anna [name] and/or Anna [name]  y  via  Telephone or Video Enabled Telemedicine Application  (Video is Caregility application) and verified that I am speaking with the correct person using two identifiers. Discussed confidentiality: Yes   I discussed the limitations of telemedicine and the availability of in person appointments.  Discussed there is a possibility of technology failure and discussed alternative modes of communication if that failure occurs.  I discussed that engaging in this telemedicine visit, they consent to the provision of behavioral healthcare and the services will be billed under their insurance.  Patient and/or legal guardian expressed understanding and consented to Telemedicine visit: Yes   Presenting Concerns: Patient and/or family reports the following symptoms/concerns: Pt states her primary concern today is housing instability after rapid rehousing program shut down two weeks ago, along with transportation, food, finances with temporary work leave due to upcoming surgery; pt attributes symptoms of depression and anxiety to financial stress. No SI.  Duration of problem: Ongoing; Severity of problem:  moderately severe  Patient and/or Family's Strengths/Protective Factors: Social connections and Sense of purpose  Goals Addressed: Patient will:  Reduce symptoms of: anxiety, depression, and stress   Increase knowledge and/or ability of:  stress reduction   Demonstrate ability to: Increase healthy adjustment to current life circumstances  Progress towards Goals: Ongoing  Interventions: Interventions utilized:  Solution-Focused Strategies and Psychoeducation and/or Health Education Standardized Assessments completed: Not Needed  Patient and/or Family Response: Pt agrees to treatment plan  Assessment: Patient currently experiencing Adjustment disorder with mixed anxious and depressed mood and Psychosocial stress.   Patient may benefit from psychoeducation and brief therapeutic interventions regarding coping with symptoms of anxiety, depression, stress .  Plan: Follow up with behavioral health clinician on : One week post-surgery Behavioral recommendations:  -Prepare home for post-surgery recovery, as discussed -Consider resources listed on After Visit Summary, for additional community support (housing, transportation, food) -Social research officer, government twice monthly for as long as needed -Consider apps (on After Visit Summary) for additional self-care Referral(s): [facility]) and [employer]:  Food, Housing, and Transportation  I discussed the assessment and treatment plan with the patient and/or parent/guardian. They were provided an opportunity to ask questions and all were answered. They agreed with the plan and demonstrated an understanding of the instructions.   They were advised to call back or seek an in-person evaluation if the symptoms worsen or if the condition fails to improve as anticipated.  Garlan Fair, LCSW  Depression screen [facility] 2/9 05/09/2021  Decreased Interest 3  Down, Depressed, Hopeless 3  PHQ - 2 Score 6  Altered sleeping 3  Tired, decreased energy 1  Change in appetite 0  Feeling bad or failure about yourself  3  Trouble concentrating 1  Moving slowly or fidgety/restless 0  Suicidal thoughts 2  PHQ-9 Score 16  Difficult doing  work/chores Not difficult at all   GAD 7 : Generalized Anxiety Score 05/09/2021  Nervous, Anxious, on Edge 1  Control/stop worrying  3  Worry too much - different things 3  Trouble relaxing 3  Restless 1  Easily annoyed or irritable 1  Afraid - awful might happen 1  Total GAD 7 Score 13

## 2021-05-11 ENCOUNTER — Ambulatory Visit
Admission: RE | Admit: 2021-05-11 | Discharge: 2021-05-11 | Disposition: A | Discharge status: Home / Self Care | Payer: Medicaid Other | Source: Ambulatory Visit | Attending: Obstetrics and Gynecology | Admitting: Obstetrics and Gynecology

## 2021-05-11 ENCOUNTER — Other Ambulatory Visit: Payer: Self-pay

## 2021-05-11 DIAGNOSIS — Z0000 Encounter for general adult medical examination without abnormal findings: Secondary | ICD-10-CM

## 2021-05-11 LAB — CYTOLOGY - PAP
Adequacy: ABSENT
Comment: NEGATIVE
Diagnosis: NEGATIVE
High risk HPV: NEGATIVE

## 2021-05-15 NOTE — H&P (Signed)
Anna [name] is an 45 y.o. [phone] female with uterine fibroids and menorrhagia. Gyn U/S confirmed uterine fibroids, vol @ 826.  Desires definite therpay  H/O HIV, stable and managed by ID H/O DM, stable Pap smear UTD  H/O TSVD x 3 and EAB x 3   Menstrual History: Menarche age: 39 Patient's last menstrual period was 04/26/2021 (exact date).    Past Medical History:  Diagnosis Date  . Hypertension    only elevated at MD office per pt. no meds    Past Surgical History:  Procedure Laterality Date  . FRACTURE SURGERY  right ankle    Family History  Problem Relation Age of Onset  . Healthy Mother   . Healthy Father     Social History:  reports that she has quit smoking. She has never used smokeless tobacco. She reports current alcohol use. She reports current drug use. Drug: Cocaine.  Allergies:  Allergies  Allergen Reactions  . Latex Hives and Swelling  . Peanut-Containing Drug Products Itching    No medications prior to admission.    Review of Systems  Constitutional: Negative.   Respiratory: Negative.   Cardiovascular: Negative.   Gastrointestinal: Negative.   Genitourinary: Positive for menstrual problem.    Last menstrual period 04/26/2021. Physical Exam Constitutional:      Appearance: Normal appearance.  Cardiovascular:     Rate and Rhythm: Normal rate.     Heart sounds: Normal heart sounds.  Pulmonary:     Effort: Pulmonary effort is normal.     Breath sounds: Normal breath sounds.  Abdominal:     General: Bowel sounds are normal.     Palpations: Abdomen is soft.     Comments: Uterus @ 18-20 weeks  Genitourinary:    Comments: Nl EGBUS, enlarged uterus @ 18-20 weeks, no adnexal masses Neurological:     Mental Status: She is alert.     No results found for this or any previous visit (from the past 24 hour(s)).  No results found.  Assessment/Plan: Uterine Fibroids Menorrhagia   Pt desires definite therapy for her cycles and enlarged  uterus. TAH with possible salpingectomy reviewed with pt. R/B/Post op care reviewed with pt. Pt has verbalized understanding and desires to proceed.    [name] 05/15/2021, 2:30 PM

## 2021-05-17 ENCOUNTER — Ambulatory Visit (INDEPENDENT_AMBULATORY_CARE_PROVIDER_SITE_OTHER): Payer: 59 | Admitting: Clinical

## 2021-05-17 DIAGNOSIS — F4323 Adjustment disorder with mixed anxiety and depressed mood: Secondary | ICD-10-CM

## 2021-05-17 DIAGNOSIS — Z658 Other specified problems related to psychosocial circumstances: Secondary | ICD-10-CM

## 2021-05-17 NOTE — Patient Instructions (Signed)
Center for [facility] [place], [place] [address] [phone] (main office) [phone] [facility] office)  If you are in need of transportation to get to and from your appointments in our office.  You can reach Transportation Services by calling [phone] Monday - Friday  7am-6pm.   Housing Resources                    [facility] (serves [place]annett, [place], [place], [place] of [place], [place], [place], [place], [place], [place], [place], [place], [place], and [place] counties) [address] [place], [place] [address] [phone] http://dawson-may.com/  **Rental assistance, Home Rehabilitation,Weatherization Assistance Program, Forensic psychologist, Housing Voucher Program  [employer]abil [employer] for Housing and Commercial Metals Company Studies: Chief Financial Officer Resources to residents of [place], [place], and [place] Make sure you have your documents ready, including:  (Household income verification: 2 months pay stubs, unemployment/social security award letter, statement of no income for all household members over 45) Photo ID for all household members over 18 Utility Bill/Rent Ledger/Lease: must show past due amount for utilities/rent, or the rental agreement if rent is current 2. Start your application online or by paper (in [place] or [place]) at:     http://boyd-evans.org/  3. Once you have completed the online application, you will get an email confirmation message from the county. Expect to hear back by phone or email at least 6-10 weeks from submitting your application.  4. While you wait:  Call [phone] to check in on your application Let your landlord know that you've applied. Your landlord will be asked to submit documents (W-9) during this application process. Payments will be made directly to the landlord/property  [place] or utility assistance for [employer]ands, [place], and [place] at https://rb.gy/dvxbfv Questions? Call or email Renee at [phone] or [email or web address]   Eviction Mediation Program: The HOPE Program Https://www.rebuild.http://mills-williams.net/ HOPE Progam serves low-income renters in [place] counties, defined as less than or equal to 80% of the area median income for the county where the renter lives. In the following 12 counties, you should apply to your local rent and utility assistance program INSTEAD OF the HOPE Program: [place], [place], [place], [place], [place], [place], [place], [place], [place], [place], [place]  If you live outside of [place], contact [place] call center at [phone] to talk to a Program Representative Monday-Friday, 8am-5pm Note that Native American tribes also received federal funding for rent and utility assistance programs. Recognized members of the following tribes will be served by programs managed by tribal governments, including: [place] Band of Cherokee Indians, [place], [place], [place] of [place] and [place], [place], [phone] [email or web address] .[employer]on [employer], [place], [phone] [email or web address] .[place] [address] [place], [place] [address] [phone] www.gha-[place].[facility] [address] Lin Landsman [place], [place] [address] [phone] https://manning.com/ **Programs include: Engineer, building services and Housing Counseling, Healthy Doctor, general practice, Homeless Prevention and [place] [address], [place], [place], [place] [address] [phone] www.https://www.farmer-stevens.info/ **housing applications/recertification; tax  payment relief/exemption under specific qualifications  [facility] [address], [place], [place] [address] www.onlinegreensboro.com/~maryshouse **transitional housing for women in recovery who have minor children or are pregnant  [place], [place], [place] [address] ArtistMovie.se  **emergency shelter and support services for  families facing homelessness  [facility] Focus [address] [place], [place] [address] [phone] www.youthfocus.org **transitional housing to pregnant women; emergency housing for youth who have run away, are experiencing a family crisis, are victims of abuse or neglect, or are homeless  [facility] [address] [place], [place] [address] [phone] ircgso.org **Drop-in center for people experiencing homelessness; overnight warming center when temperature is 25 degrees or below  Re-Entry Staffing [place], [place], [place] [address] [phone] https://reentrystaffingagency.org/ **help with affordable housing to people experiencing homelessness or unemployment due to incarceration  [facility] [address], [place], [place] [address] [phone] www.greensborourbanministry.org  **emergency and transitional housing, rent/mortgage assistance, utility assistance  [facility] [address] [place], [place] [address] [phone] www.salvationarmyofgreensboro.org **emergency and transitional housing  Habitat for [employer] [place], [place], [place] [address] [phone] Www.habitatgreensboro.[place], [place], [place] [address] [phone] https://chshousing.org **[place] and [facility] [place]  Housing Consultants Group [address] [place] [address], [place], [place] [address] [phone] arrivance.com **home buyer education courses, foreclosure prevention  [facility] [address] [place], [place] [address] [phone] WirelessNovelties.no **Environmental Exposure Assessment (investigation of homes where either children or pregnant women with a confirmed elevated blood lead level reside)  [facility] [place], [place], [place] [address] [phone] http://www.perez.com/ **Home Expense Assistance/Repairs Program; offers home accessibility updates, such as ramps or bars in the bathroom  Self-Help Credit Union[facility] [address], [place], [place] [address] [phone] https://www.self-help.org/locations/[place]-branch **Offers credit-building and banking services to people unable to use [place] of [facility] [place], [place], [place] [address] [phone] RoulettePays.com.br   [facility] [address] [place], [place] [address] [phone]  ClubMonetize.fr **housing applications/recertification, emergency and transitional housing  Open [employer] of [employer]ands [address], [place], [place] [address] [phone] www.odm-hp.org  **emergency and permanent housing; rent/mortgage payment assistance  Habitat for [employer], [place] and [place] [address], [place], [place] [address] [phone] ArchitectReviews.com.au  Family Services of the [facility], [employer] [place], [place], [place] [address] www.familyservice-piedmont.org **emergency shelter for victims of domestic violence and sexual assault  [facility]ior [facility] [address], [place], [place] [address] [phone] www.senior-resources-guilford.org **Home  expense assistance/repairs for older [place] of [place], [place], [place] [address] [phone] NameDisc.is  **May offer help with minor housing repairs  [facility] [address], [place], [place] [address] [phone] **emergency housing for victims of domestic violence  Housing Resources [place], [place], [place] [facility])  [facility] [address] [place], [place] [address] [phone] www.newrha.[facility] [address] [place], [place] [address] ([place] [address] [place], [place], [place] [address] [phone] www.co.[place].us **Housing applications/recertification; tax payment relief/exemption w specific qualifications  [place] for Homeless [address] [place], [place] [address] [phone] Http://www.rchelpforhomeless.org/HOME_PAGE.html  HELP, [place] [address], [place],  [address] [phone] [place] [phone] Hours of Operation: Monday-Friday, 8:30am-5:00pm http://helpincorporated.org **Includes emergency housing for victims of domestic violence  [place] [address] [place],  [place] [address] [phone]   or  www.http://james-garner.info/ **SNAP/EBT/ Other nutritional benefits  [facility] [address], [place], [place] [address] [phone]  or  https://palmer-smith.com/ **WIC for  women who are pregnant and postpartum, infants and children up to 26 years old  [place] [address], [place], [place] [address] [phone]   or    www.theblessedtable.org  **Food pantry  Brother Kolbe's [place], [place], [place] [address] [phone]   or   https://brotherkolbes.godaddysites.com  **Emergency food and prepared meals  [place] [address] [place], [place] [address] [phone]   or   www.cedargrovetop.us **Food pantry  [place] [facility] [address], [place], [place] [address] [phone]   or   www.https://hartman-jones.net/ **Food pantry  E[employer] Hands Food Pantry [address], [place], [place] [address] [phone] **Food pantry  [facility] [address] [place], [place] [address] [phone]   or   www.greensborourbanministry.org  Insurance underwriter and prepared meals  [facility] [place] [address] [place], [place], [place], [place] [address] DomainerFinder.be  **Food pantry  [place] [employer] Food Pantry [address] [place], [place] [address] [phone]   or   www.lbcnow.org  **Food pantry  One Step Further [address] [place], [place] [address] [phone]   or   http://patterson-parker.net/ **Food pantry, nutrition education, gardening activities  [place] [address], [place], [place] [address] [phone] **Food pantry  [facility] [address] [place], [place] [address] [phone]   or   www.salvationarmyofgreensboro.Lovette Cliche of [place], [place], [place] [address] [phone]   or   http://senior-resources-guilford.org [facility] [place] [address] [place], [place] [address] [phone]   or   www.stmattchurch.com  **Food pantry  [place] [address], [place], [place]  [address] [phone]   or   vandaliapresbyterianchurch.org **Food pantry  [place] [facility] [address] [place], [place], [place] [address] [phone]   or   www.Mormon101.be Food Civil Service fast streamer of [place] [address] Jacinto Reap [place], [place] [address] [phone] **Food pantry  [place] Resources   Department of [facility] [address], [place], [place] [address] [phone]   or   www.co..Gaffney.us/ph/  [place] [facility]) [address], [place], [place] [address] [phone]   or   MedicationWebsites.com.au **WIC for pregnant and postpartum women, infants and children up to 84 years old  Compassionate Pantry [address] [place], [place] [address] [phone] **Food pantry  [place] [address] [place], [place] [address] [phone]   or   emerywoodbaptistchurch.com *Food pantry  Five loaves Two Fish Food Pantry [address], [place], [place] [address] [phone]   or   www.fcchighpoint.Radonna RickerFood pantry  [facility] [address] [place], [place] [address] [phone]   or   http://www.Sporrer.com/ **Food pantry  [place] [address], [place],  [address] [phone]   or   www.facebook.com/KBCI1 Social worker of [place] [address] [place],  [address] [phone]   or   www.abbottscreek.org **  Food Hershey Company of [place] [phone]   **Delivers meals  New Beginnings Full [facility] [address] [place], [place] [address] [phone]   or   nbfgm.sundaystreamwebsites.com  Programme researcher, broadcasting/film/video of [employer]ands [address] [place], [place]  [address] [phone]   or   www.odm-hp.org  **Food pantry  [place] [address] [place], [place] [address] [phone]   or   R2live.tv **Food pantry  [place] [address] [place], [place] [address] [phone]   or   ClubMonetize.fr **Emergency food and pet food  Senior [place] [address], [place], [place] [address] [phone]   or   www.senioradults.org **Congregate and delivered meals to older adults  [place] Way of Greater [employer] Brands [address], [place], [place] [address] [phone]   or   SamedayNews.com.cy **Back Pack Program for elementary school students  [place] [address] [place], [place] [address] [phone]   or   www.wardstreetcommunityresources.org **Food pantry  [facility] [address] [place], [place] [address] [phone]   or   https://www.carlson.net/ **Emergency food, nutrition classes, food budgeting  /Emotional Wellbeing Apps and Websites Here are a few free apps meant to help you to help yourself.  To find, try searching on the internet to see if the app is offered on Apple/Android devices. If your first choice doesn't come up on your device, the good news is that there are many choices! Play around with different apps to see which ones are helpful to you.    Calm This is an app meant to help increase calm feelings. Includes info, strategies, and tools for tracking your feelings.      Calm Harm  This app is meant to help with self-harm. Provides many 5-minute or 15-min coping strategies for doing instead of hurting yourself.       Bernice is a problem-solving tool to help deal with emotions and cope with stress you encounter wherever you are.      MindShift This app can help people cope with anxiety. Rather than trying to avoid anxiety, you can make an important shift and  face it.      MY3  MY3 features a support system, safety plan and resources with the goal of offering a tool to use in a time of need.       My Life My Voice  This mood journal offers a simple solution for tracking your thoughts, feelings and moods. Animated emoticons can help identify your mood.       Relax Melodies Designed to help with sleep, on this app you can mix sounds and meditations for relaxation.      Smiling Mind Smiling Mind is meditation made easy: it's a simple tool that helps put a smile on your mind.        Stop, Breathe & Think  A friendly, simple guide for people through meditations for mindfulness and compassion.  Stop, Breathe and Think Kids Enter your current feelings and choose a "mission" to help you cope. Offers videos for certain moods instead of just sound recordings.       Team Orange The goal of this tool is to help teens change how they think, act, and react. This app helps you focus on your own good feelings and experiences.      [place]  Box The [place] Box (VHB) contains simple tools to help patients with coping, relaxation, distraction, and positive thinking.

## 2021-05-17 NOTE — Pre-Procedure Instructions (Signed)
Surgical Instructions    Your procedure is scheduled on Tuesday, June 14th.  [facility] Main Entrance "A" at 1:00 P.M., then check in with the Admitting office.  Call this number if you have problems the morning of surgery:  [phone]   If you have any questions prior to your surgery date call [phone]: Open Monday-Friday 8am-4pm    Remember:  Do not eat after midnight the night before your surgery  You may drink clear liquids until 12:00 p.m. the afternoon of your surgery.   Clear liquids allowed are: Water, Non-Citrus Juices (without pulp), Carbonated Beverages, Clear Tea, Black Coffee Only, and Gatorade.    Take these medicines the morning of surgery with A SIP OF WATER  megestrol (MEGACE)   As of today, STOP taking any Aspirin (unless otherwise instructed by your surgeon) Aleve, Naproxen, Ibuprofen, Motrin, Advil, Goody's, BC's, all herbal medications, fish oil, and all vitamins.                     Do NOT Smoke (Tobacco/Vaping) or drink Alcohol 24 hours prior to your procedure.  If you use a CPAP at night, you may bring all equipment for your overnight stay.   Contacts, glasses, piercing's, hearing aid's, dentures or partials may not be worn into surgery, please bring cases for these belongings.    For patients admitted to the hospital, discharge time will be determined by your treatment team.   Patients discharged the day of surgery will not be allowed to drive home, and someone needs to stay with them for 24 hours.    Special instructions:   [place]- Preparing For Surgery  Before surgery, you can play an important role. Because skin is not sterile, your skin needs to be as free of germs as possible. You can reduce the number of germs on your skin by washing with CHG (chlorahexidine gluconate) Soap before surgery.  CHG is an antiseptic cleaner which kills germs and bonds with the skin to continue killing germs even after washing.    Oral Hygiene is  also important to reduce your risk of infection.  Remember - BRUSH YOUR TEETH THE MORNING OF SURGERY WITH YOUR REGULAR TOOTHPASTE  Please do not use if you have an allergy to CHG or antibacterial soaps. If your skin becomes reddened/irritated stop using the CHG.  Do not shave (including legs and underarms) for at least 48 hours prior to first CHG shower. It is OK to shave your face.  Please follow these instructions carefully.   Shower the NIGHT BEFORE SURGERY and the MORNING OF SURGERY  If you chose to wash your hair, wash your hair first as usual with your normal shampoo.  After you shampoo, rinse your hair and body thoroughly to remove the shampoo.  Use CHG Soap as you would any other liquid soap. You can apply CHG directly to the skin and wash gently with a scrungie or a clean washcloth.   Apply the CHG Soap to your body ONLY FROM THE NECK DOWN.  Do not use on open wounds or open sores. Avoid contact with your eyes, ears, mouth and genitals (private parts). Wash Face and genitals (private parts)  with your normal soap.   Wash thoroughly, paying special attention to the area where your surgery will be performed.  Thoroughly rinse your body with warm water from the neck down.  DO NOT shower/wash with your normal soap after using and rinsing off the CHG Soap.  Anna [name]  yourself dry with a CLEAN TOWEL.  Wear CLEAN PAJAMAS to bed the night before surgery  Place CLEAN SHEETS on your bed the night before your surgery  DO NOT SLEEP WITH PETS.   Day of Surgery: Shower with CHG soap. Do not wear jewelry, make up, or nail polish Do not wear lotions, powders, perfumes, or deodorant. Do not shave 48 hours prior to surgery.  Do not bring valuables to the hospital. [facility] is not responsible for any belongings or valuables. Wear Clean/Comfortable clothing the morning of surgery Remember to brush your teeth WITH YOUR REGULAR TOOTHPASTE.   Please read over the following fact sheets that  you were given.

## 2021-05-18 ENCOUNTER — Other Ambulatory Visit: Payer: Self-pay

## 2021-05-18 ENCOUNTER — Encounter (HOSPITAL_COMMUNITY): Payer: Self-pay

## 2021-05-18 ENCOUNTER — Encounter (HOSPITAL_COMMUNITY)
Admission: RE | Admit: 2021-05-18 | Discharge: 2021-05-18 | Disposition: A | Discharge status: Home / Self Care | Payer: 59 | Source: Ambulatory Visit | Attending: Obstetrics and Gynecology | Admitting: Obstetrics and Gynecology

## 2021-05-18 DIAGNOSIS — Z20822 Contact with and (suspected) exposure to covid-19: Secondary | ICD-10-CM | POA: Insufficient documentation

## 2021-05-18 DIAGNOSIS — Z01812 Encounter for preprocedural laboratory examination: Secondary | ICD-10-CM | POA: Insufficient documentation

## 2021-05-18 HISTORY — DX: Anemia, unspecified: D64.9

## 2021-05-18 LAB — COMPREHENSIVE METABOLIC PANEL
ALT: 11 U/L (ref 0–44)
AST: 17 U/L (ref 15–41)
Albumin: 3.6 g/dL (ref 3.5–5.0)
Alkaline Phosphatase: 48 U/L (ref 38–126)
Anion gap: 8 (ref 5–15)
BUN: 6 mg/dL (ref 6–20)
CO2: 25 mmol/L (ref 22–32)
Calcium: 9.2 mg/dL (ref 8.9–10.3)
Chloride: 106 mmol/L (ref 98–111)
Creatinine, Ser: 0.76 mg/dL (ref 0.44–1.00)
GFR, Estimated: >60 mL/min (ref >60)
Glucose, Bld: 78 mg/dL (ref 70–99)
Potassium: 3.2 mmol/L — ABNORMAL LOW (ref 3.5–5.1)
Sodium: 139 mmol/L (ref 135–145)
Total Bilirubin: 0.4 mg/dL (ref 0.3–1.2)
Total Protein: 6.9 g/dL (ref 6.5–8.1)

## 2021-05-18 LAB — CBC
HCT: 30.2 % — ABNORMAL LOW (ref 36.0–46.0)
Hemoglobin: 8.8 g/dL — ABNORMAL LOW (ref 12.0–15.0)
MCH: 21.4 pg — ABNORMAL LOW (ref 26.0–34.0)
MCHC: 29.1 g/dL — ABNORMAL LOW (ref 30.0–36.0)
MCV: 73.3 fL — ABNORMAL LOW (ref 80.0–100.0)
Platelets: 549 10*3/uL — ABNORMAL HIGH (ref 150–400)
RBC: 4.12 MIL/uL (ref 3.87–5.11)
RDW: 29.5 % — ABNORMAL HIGH (ref 11.5–15.5)
WBC: 5.7 10*3/uL (ref 4.0–10.5)
nRBC: 0 % (ref 0.0–0.2)

## 2021-05-18 LAB — RAPID URINE DRUG SCREEN, HOSP PERFORMED
Amphetamines: NOT DETECTED
Barbiturates: NOT DETECTED
Benzodiazepines: NOT DETECTED
Cocaine: NOT DETECTED
Opiates: NOT DETECTED
Tetrahydrocannabinol: POSITIVE — AB

## 2021-05-18 LAB — SARS CORONAVIRUS 2 (TAT 6-24 HRS): SARS Coronavirus 2: NEGATIVE

## 2021-05-18 NOTE — Anesthesia Preprocedure Evaluation (Addendum)
Anesthesia Evaluation  Patient identified by MRN, date of birth, ID band Patient awake    Reviewed: Allergy & Precautions, NPO status , Patient's Chart, lab work & pertinent test results  History of Anesthesia Complications Negative for: history of anesthetic complications  Airway Mallampati: II  TM Distance: >3 FB Neck ROM: Full    Dental no notable dental hx. (+) Dental Advisory Given   Pulmonary Current Smoker and Patient abstained from smoking.,    Pulmonary exam normal        Cardiovascular hypertension, Normal cardiovascular exam     Neuro/Psych negative neurological ROS     GI/Hepatic negative GI ROS, Neg liver ROS,   Endo/Other  negative endocrine ROS  Renal/GU negative Renal ROS     Musculoskeletal negative musculoskeletal ROS (+)   Abdominal   Peds  Hematology  (+) anemia ,   Anesthesia Other Findings   Reproductive/Obstetrics                           Anesthesia Physical Anesthesia Plan  ASA: 3  Anesthesia Plan: General   Post-op Pain Management:  Regional for Post-op pain   Induction: Intravenous  PONV Risk Score and Plan: 4 or greater and Dexamethasone, Midazolam and Scopolamine patch - Pre-op  Airway Management Planned: Oral ETT  Additional Equipment:   Intra-op Plan:   Post-operative Plan: Extubation in OR  Informed Consent: I have reviewed the patients History and Physical, chart, labs and discussed the procedure including the risks, benefits and alternatives for the proposed anesthesia with the patient or authorized representative who has indicated his/her understanding and acceptance.     Dental advisory given  Plan Discussed with: Anesthesiologist, CRNA and Surgeon  Anesthesia Plan Comments: (PAT note written 05/18/2021 by Myra Gianotti, PA-C. )      Anesthesia Quick Evaluation

## 2021-05-18 NOTE — Progress Notes (Signed)
Anesthesia Chart Review:  Case: [id number] Date/Time: 05/22/21 1452   Procedure: HYSTERECTOMY ABDOMINAL WITH POSSIBLE SALPINGECTOMY (Bilateral) - TAP BLOCK   Anesthesia type: Choice   Pre-op diagnosis: FIBROIDS   Location: [facility] OR ROOM 07 / [place] OR   Surgeons: [name], MD       DISCUSSION: Patient is a 45 year old female scheduled for the above procedure.  History includes smoking, HTN (white coat?), anemia. Cocaine use, reported last use ~ 2 weeks ago. Reportedly occasional/not often alcohol use.   - ED visit 05/03/21 for rash. - ED visit 05/01/21 for recurrent vaginal bleeding since 04/27/21. HGB down to 7.9. GYN contacted and given Premarin 25 mg IV x1. Keep follow-up with Dr. Rip Harbour on 05/09/21, which she di and hysterectomy planned.  - Admission 04/19/21-04/20/21 for symptomatic anemia (weakness) due to vaginal bleeding. HGB 4.8. Ultrasound does show large leiomyoma, unable to see ovaries due to large uterus.  CT showed enlarged uterus, likely from fibroids, possible left posterior wall bladder thickening. She received a total of PRBC x3 units and started on Megace and oral iron. HGB up to 8.5 at discharge. Hospitalist recommended repeat UA at out-patient primary care visit and refer to urology if hematuria still present.   H/H 8.8/30.2, but up from 7.9/27.0 on 05/01/21. She remains on Megace and ferrous sulfate. She is for T&S and urine pregnancy test on the day of surgery.   05/18/21 COVID-19 test in process. After PAT RN communication with Dr. Rip Harbour, UDS done at PAT do to cocaine use history and patient somewhat evasive with staff during interview. UDS was positive for THC, but negative for cocaine.   Anesthesia team to evaluate on the day of surgery.   VS: BP (!) 131/92   Pulse 88   Temp 36.8 C (Oral)   Resp 18   Ht 5\' 6"  (1.676 m)   Wt 77.2 kg   LMP 04/26/2021 (Exact Date)   SpO2 100%   BMI 27.45 kg/m   PROVIDERS: Patient, No Pcp Per (Inactive) McMannes, Jamie, LCSW evaluated  for [place] visit on 05/17/21.    LABS: Preoperative labs noted. See DISCUSSION. (all labs ordered are listed, but only abnormal results are displayed)  Labs Reviewed  CBC - Abnormal; Notable for the following components:      Result Value   Hemoglobin 8.8 (*)    HCT 30.2 (*)    MCV 73.3 (*)    MCH 21.4 (*)    MCHC 29.1 (*)    RDW 29.5 (*)    Platelets 549 (*)    All other components within normal limits  COMPREHENSIVE METABOLIC PANEL - Abnormal; Notable for the following components:   Potassium 3.2 (*)    All other components within normal limits  RAPID URINE DRUG SCREEN, HOSP PERFORMED - Abnormal; Notable for the following components:   Tetrahydrocannabinol POSITIVE (*)    All other components within normal limits  SARS CORONAVIRUS 2 (TAT 6-24 HRS)     IMAGES: CT abd/pelvis 04/19/21: IMPRESSION: 1. Right greater than left renal pelvis dilatation without hydroureter or obstructing ureteral stone. 2. Enlarged uterus likely due to fibroids 3. Questionable asymmetric wall thickening/soft tissue density at the left posterior bladder, no definite correlate on recent pelvic ultrasound imaging though only limited views of the bladder. Could consider correlation with urinalysis with dedicated ultrasound of the bladder as indicated.    Transvaginal pelvis US 04/19/21: IMPRESSION: 1. Enlarged apparently leiomyomatous uterus. Dominant leiomyoma arises from the superior uterine fundus measuring 9.2  x 6.4 x 6.3 cm. 2. Endometrium does not appear thickened for premenopausal state. No endometrial lesion evident. 3.  Subcentimeter cervical nabothian cyst. 4. Neither ovary could be visualized by transabdominal transvaginal assessment. No extrauterine pelvic mass evident. No appreciable free fluid.   EKG: 04/19/21:  Sinus rhythm No significant change since prior 12/11 Confirmed by Aletta Edouard [phone]) on 04/19/2021 11:51:20 AM   CV: Reported stress test >  10 years ago due to anxiety.   Past Medical History:  Diagnosis Date   Anemia    Hypertension    only elevated at MD office per pt. no meds    Past Surgical History:  Procedure Laterality Date   FRACTURE SURGERY  right ankle    MEDICATIONS:  ferrous sulfate 325 (65 FE) MG tablet   megestrol (MEGACE) 40 MG tablet   triamcinolone (KENALOG) 0.025 % ointment   No current facility-administered medications for this encounter.    Myra Gianotti, PA-C Surgical Short Stay/Anesthesiology [facility] Phone [phone] [facility] Phone [phone] 05/18/2021 4:33 PM

## 2021-05-18 NOTE — Progress Notes (Signed)
PCP - denies Cardiologist -denies   Chest x-ray - n/a EKG - 04/20/21 Stress Test - 10+ years ago. Stated it was anxiety related. ECHO - denies Cardiac Cath - denies  Sleep Study - denies CPAP - denies  Blood Thinner Instructions: n/a Aspirin Instructions: n/a  ERAS Protcol -clear liquids until 1200 DOS. PRE-SURGERY Ensure or G2- none ordered.  COVID TEST- 05/18/21.   Anesthesia review: Yes, Myra Gianotti, PA-C aware.   Patient denies shortness of breath, fever, cough and chest pain at PAT appointment   All instructions explained to the patient, with a verbal understanding of the material. Patient agrees to go over the instructions while at home for a better understanding. Patient also instructed to self quarantine after being tested for COVID-19. The opportunity to ask questions was provided.

## 2021-05-21 ENCOUNTER — Telehealth: Payer: Self-pay | Admitting: *Deleted

## 2021-05-21 NOTE — Telephone Encounter (Signed)
Call to patient, Advised surgery time change to [phone].  Encounter closed.

## 2021-05-21 NOTE — BH Specialist Note (Signed)
Integrated Behavioral Health via Telemedicine Visit  05/21/2021 Anna [name] [id number]  Number of Anna [name] visits: 1 Session Start time: 9:27  Session End time: 9:32 Total time:  5  Brief phone visit; pt's primary concern today is experiencing current pain post-surgery, and worries that she has only one pain pill left; also has questions regarding honeycomb dressing. Pt sounds to be in pain, requests refill of pain medication, and agrees for [facility] [place] to call her back in about a week to check on how she's feeling at that time. Pt is aware she may call Roselyn Reef at [phone] as needed to schedule any follow up visits.   Caroleen Hamman Junie Engram, LCSW

## 2021-05-22 ENCOUNTER — Inpatient Hospital Stay (HOSPITAL_COMMUNITY)
Admission: RE | Admit: 2021-05-22 | Discharge: 2021-05-24 | DRG: 743 | Disposition: A | Discharge status: Home / Self Care | Payer: 59 | Attending: Obstetrics and Gynecology | Admitting: Obstetrics and Gynecology

## 2021-05-22 ENCOUNTER — Inpatient Hospital Stay (HOSPITAL_COMMUNITY): Payer: 59 | Admitting: Anesthesiology

## 2021-05-22 ENCOUNTER — Encounter (HOSPITAL_COMMUNITY)
Admission: RE | Disposition: A | Discharge status: Home / Self Care | Payer: Self-pay | Source: Home / Self Care | Attending: Obstetrics and Gynecology

## 2021-05-22 ENCOUNTER — Inpatient Hospital Stay (HOSPITAL_COMMUNITY): Payer: 59 | Admitting: Vascular Surgery

## 2021-05-22 DIAGNOSIS — D219 Benign neoplasm of connective and other soft tissue, unspecified: Secondary | ICD-10-CM

## 2021-05-22 DIAGNOSIS — I10 Essential (primary) hypertension: Secondary | ICD-10-CM | POA: Diagnosis not present

## 2021-05-22 DIAGNOSIS — Z87891 Personal history of nicotine dependence: Secondary | ICD-10-CM | POA: Diagnosis not present

## 2021-05-22 DIAGNOSIS — N920 Excessive and frequent menstruation with regular cycle: Secondary | ICD-10-CM | POA: Diagnosis not present

## 2021-05-22 DIAGNOSIS — Z91010 Allergy to peanuts: Secondary | ICD-10-CM

## 2021-05-22 DIAGNOSIS — Z9889 Other specified postprocedural states: Secondary | ICD-10-CM

## 2021-05-22 DIAGNOSIS — Z91040 Latex allergy status: Secondary | ICD-10-CM

## 2021-05-22 DIAGNOSIS — D259 Leiomyoma of uterus, unspecified: Principal | ICD-10-CM | POA: Diagnosis present

## 2021-05-22 HISTORY — PX: HYSTERECTOMY ABDOMINAL WITH SALPINGECTOMY: SHX6725

## 2021-05-22 LAB — PREPARE RBC (CROSSMATCH)

## 2021-05-22 LAB — POCT PREGNANCY, URINE: Preg Test, Ur: NEGATIVE

## 2021-05-22 SURGERY — HYSTERECTOMY, TOTAL, ABDOMINAL, WITH SALPINGECTOMY
Anesthesia: General | Site: Abdomen | Laterality: Bilateral

## 2021-05-22 MED ORDER — CELECOXIB 200 MG PO CAPS: 200.0000 mg | ORAL_CAPSULE | Freq: Once | ORAL | Status: DC

## 2021-05-22 MED ORDER — SODIUM CHLORIDE 0.9% IV SOLUTION: Freq: Once | INTRAVENOUS | Status: AC

## 2021-05-22 MED ORDER — ONDANSETRON HCL 4 MG/2ML IJ SOLN
INTRAMUSCULAR | Status: DC | PRN
  Administered 2021-05-22: 4 mg via INTRAVENOUS

## 2021-05-22 MED ORDER — FENTANYL CITRATE (PF) 100 MCG/2ML IJ SOLN
25.0000 ug | INTRAMUSCULAR | Status: DC | PRN
  Administered 2021-05-22 (×3): 50 ug via INTRAVENOUS

## 2021-05-22 MED ORDER — PHENYLEPHRINE HCL (PRESSORS) 10 MG/ML IV SOLN
INTRAVENOUS | Status: DC | PRN
  Administered 2021-05-22: 50 ug via INTRAVENOUS

## 2021-05-22 MED ORDER — DEXAMETHASONE SODIUM PHOSPHATE 10 MG/ML IJ SOLN
INTRAMUSCULAR | Status: DC | PRN
  Administered 2021-05-22: 4 mg via INTRAVENOUS

## 2021-05-22 MED ORDER — MIDAZOLAM HCL 2 MG/2ML IJ SOLN
2.0000 mg | Freq: Once | INTRAMUSCULAR | Status: AC
  Administered 2021-05-22: 2 mg via INTRAVENOUS

## 2021-05-22 MED ORDER — CHLORHEXIDINE GLUCONATE 0.12 % MT SOLN
15.0000 mL | Freq: Once | OROMUCOSAL | Status: AC
  Administered 2021-05-22: 15 mL via OROMUCOSAL
  Filled 2021-05-22: qty 15

## 2021-05-22 MED ORDER — FENTANYL CITRATE (PF) 250 MCG/5ML IJ SOLN
INTRAMUSCULAR | Status: AC
  Filled 2021-05-22: qty 5

## 2021-05-22 MED ORDER — LACTATED RINGERS IV SOLN: INTRAVENOUS | Status: DC

## 2021-05-22 MED ORDER — ACETAMINOPHEN 500 MG PO TABS: 1000.0000 mg | ORAL_TABLET | Freq: Once | ORAL | Status: DC

## 2021-05-22 MED ORDER — DEXAMETHASONE SODIUM PHOSPHATE 10 MG/ML IJ SOLN
INTRAMUSCULAR | Status: AC
  Filled 2021-05-22: qty 1

## 2021-05-22 MED ORDER — FENTANYL CITRATE (PF) 100 MCG/2ML IJ SOLN
INTRAMUSCULAR | Status: AC
  Administered 2021-05-22: 100 ug
  Filled 2021-05-22: qty 2

## 2021-05-22 MED ORDER — MIDAZOLAM HCL 2 MG/2ML IJ SOLN
INTRAMUSCULAR | Status: AC
  Filled 2021-05-22: qty 2

## 2021-05-22 MED ORDER — ONDANSETRON HCL 4 MG PO TABS: 4.0000 mg | ORAL_TABLET | Freq: Four times a day (QID) | ORAL | Status: DC | PRN

## 2021-05-22 MED ORDER — FENTANYL CITRATE (PF) 250 MCG/5ML IJ SOLN
INTRAMUSCULAR | Status: DC | PRN
  Administered 2021-05-22: 50 ug via INTRAVENOUS
  Administered 2021-05-22: 100 ug via INTRAVENOUS
  Administered 2021-05-22 (×2): 50 ug via INTRAVENOUS

## 2021-05-22 MED ORDER — FENTANYL CITRATE (PF) 100 MCG/2ML IJ SOLN
INTRAMUSCULAR | Status: AC
  Filled 2021-05-22: qty 2

## 2021-05-22 MED ORDER — OXYCODONE-ACETAMINOPHEN 5-325 MG PO TABS
2.0000 | ORAL_TABLET | ORAL | Status: DC | PRN
  Administered 2021-05-22 – 2021-05-24 (×4): 2 via ORAL
  Filled 2021-05-22 (×4): qty 2

## 2021-05-22 MED ORDER — BUPIVACAINE LIPOSOME 1.3 % IJ SUSP
INTRAMUSCULAR | Status: DC | PRN
  Administered 2021-05-22 (×2): 10 mL

## 2021-05-22 MED ORDER — IBUPROFEN 800 MG PO TABS
800.0000 mg | ORAL_TABLET | Freq: Three times a day (TID) | ORAL | Status: DC
  Administered 2021-05-23 – 2021-05-24 (×3): 800 mg via ORAL
  Filled 2021-05-22 (×4): qty 1

## 2021-05-22 MED ORDER — SIMETHICONE 80 MG PO CHEW: 80.0000 mg | CHEWABLE_TABLET | Freq: Four times a day (QID) | ORAL | Status: DC | PRN

## 2021-05-22 MED ORDER — 0.9 % SODIUM CHLORIDE (POUR BTL) OPTIME
TOPICAL | Status: DC | PRN
  Administered 2021-05-22: 1000 mL

## 2021-05-22 MED ORDER — OXYCODONE-ACETAMINOPHEN 5-325 MG PO TABS: 1.0000 | ORAL_TABLET | ORAL | Status: DC | PRN

## 2021-05-22 MED ORDER — ROCURONIUM BROMIDE 100 MG/10ML IV SOLN
INTRAVENOUS | Status: DC | PRN
  Administered 2021-05-22: 80 mg via INTRAVENOUS

## 2021-05-22 MED ORDER — BUPIVACAINE HCL (PF) 0.25 % IJ SOLN
INTRAMUSCULAR | Status: DC | PRN
  Administered 2021-05-22 (×2): 15 mL via EPIDURAL

## 2021-05-22 MED ORDER — PROPOFOL 10 MG/ML IV BOLUS
INTRAVENOUS | Status: DC | PRN
  Administered 2021-05-22: 200 mg via INTRAVENOUS

## 2021-05-22 MED ORDER — AMISULPRIDE (ANTIEMETIC) 5 MG/2ML IV SOLN
INTRAVENOUS | Status: AC
  Filled 2021-05-22: qty 4

## 2021-05-22 MED ORDER — ROCURONIUM BROMIDE 10 MG/ML (PF) SYRINGE
PREFILLED_SYRINGE | INTRAVENOUS | Status: AC
  Filled 2021-05-22: qty 10

## 2021-05-22 MED ORDER — POTASSIUM CHLORIDE 2 MEQ/ML IV SOLN
INTRAVENOUS | Status: DC
  Filled 2021-05-22 (×4): qty 1000

## 2021-05-22 MED ORDER — FENTANYL CITRATE (PF) 100 MCG/2ML IJ SOLN: 100.0000 ug | Freq: Once | INTRAMUSCULAR | Status: AC

## 2021-05-22 MED ORDER — HYDROMORPHONE HCL 1 MG/ML IJ SOLN
1.0000 mg | INTRAMUSCULAR | Status: DC | PRN
  Administered 2021-05-22 – 2021-05-24 (×8): 1 mg via INTRAVENOUS
  Filled 2021-05-22 (×9): qty 1

## 2021-05-22 MED ORDER — MIDAZOLAM HCL 5 MG/5ML IJ SOLN
INTRAMUSCULAR | Status: DC | PRN
  Administered 2021-05-22: 2 mg via INTRAVENOUS

## 2021-05-22 MED ORDER — ACETAMINOPHEN 500 MG PO TABS
1000.0000 mg | ORAL_TABLET | ORAL | Status: AC
  Administered 2021-05-22: 1000 mg via ORAL
  Filled 2021-05-22: qty 2

## 2021-05-22 MED ORDER — ORAL CARE MOUTH RINSE: 15.0000 mL | Freq: Once | OROMUCOSAL | Status: AC

## 2021-05-22 MED ORDER — ONDANSETRON HCL 4 MG/2ML IJ SOLN
4.0000 mg | Freq: Four times a day (QID) | INTRAMUSCULAR | Status: DC | PRN
  Administered 2021-05-22: 4 mg via INTRAVENOUS
  Filled 2021-05-22: qty 2

## 2021-05-22 MED ORDER — PROMETHAZINE HCL 25 MG/ML IJ SOLN: 6.2500 mg | INTRAMUSCULAR | Status: DC | PRN

## 2021-05-22 MED ORDER — AMISULPRIDE (ANTIEMETIC) 5 MG/2ML IV SOLN
10.0000 mg | Freq: Once | INTRAVENOUS | Status: AC | PRN
  Administered 2021-05-22: 10 mg via INTRAVENOUS

## 2021-05-22 MED ORDER — SCOPOLAMINE 1 MG/3DAYS TD PT72
1.0000 | MEDICATED_PATCH | TRANSDERMAL | Status: DC
  Administered 2021-05-22: 1.5 mg via TRANSDERMAL
  Filled 2021-05-22: qty 1

## 2021-05-22 MED ORDER — SOD CITRATE-CITRIC ACID 500-334 MG/5ML PO SOLN
30.0000 mL | ORAL | Status: AC
  Administered 2021-05-22: 30 mL via ORAL
  Filled 2021-05-22: qty 15

## 2021-05-22 MED ORDER — PHENYLEPHRINE 40 MCG/ML (10ML) SYRINGE FOR IV PUSH (FOR BLOOD PRESSURE SUPPORT)
PREFILLED_SYRINGE | INTRAVENOUS | Status: AC
  Filled 2021-05-22: qty 10

## 2021-05-22 MED ORDER — CEFAZOLIN SODIUM-DEXTROSE 2-4 GM/100ML-% IV SOLN
2.0000 g | INTRAVENOUS | Status: AC
  Administered 2021-05-22: 2 g via INTRAVENOUS
  Filled 2021-05-22: qty 100

## 2021-05-22 MED ORDER — SENNA 8.6 MG PO TABS
1.0000 | ORAL_TABLET | Freq: Two times a day (BID) | ORAL | Status: DC
  Administered 2021-05-23 – 2021-05-24 (×3): 8.6 mg via ORAL
  Filled 2021-05-22 (×4): qty 1

## 2021-05-22 MED ORDER — ONDANSETRON HCL 4 MG/2ML IJ SOLN
INTRAMUSCULAR | Status: AC
  Filled 2021-05-22: qty 2

## 2021-05-22 MED ORDER — PROPOFOL 10 MG/ML IV BOLUS
INTRAVENOUS | Status: AC
  Filled 2021-05-22: qty 20

## 2021-05-22 MED ORDER — POVIDONE-IODINE 10 % EX SWAB
2.0000 "application " | Freq: Once | CUTANEOUS | Status: AC
  Administered 2021-05-22: 2 via TOPICAL

## 2021-05-22 MED ORDER — KETOROLAC TROMETHAMINE 30 MG/ML IJ SOLN
30.0000 mg | Freq: Four times a day (QID) | INTRAMUSCULAR | Status: AC
  Administered 2021-05-22 – 2021-05-23 (×4): 30 mg via INTRAVENOUS
  Filled 2021-05-22 (×4): qty 1

## 2021-05-22 MED ORDER — KETOROLAC TROMETHAMINE 15 MG/ML IJ SOLN
15.0000 mg | INTRAMUSCULAR | Status: AC
  Administered 2021-05-22: 15 mg via INTRAVENOUS
  Filled 2021-05-22: qty 1

## 2021-05-22 MED ORDER — SUGAMMADEX SODIUM 200 MG/2ML IV SOLN
INTRAVENOUS | Status: DC | PRN
  Administered 2021-05-22: 200 mg via INTRAVENOUS

## 2021-05-22 SURGICAL SUPPLY — 49 items
APL SKNCLS STERI-STRIP NONHPOA (GAUZE/BANDAGES/DRESSINGS) ×1
BARRIER ADHS 3X4 INTERCEED (GAUZE/BANDAGES/DRESSINGS) IMPLANT
BENZOIN TINCTURE PRP APPL 2/3 (GAUZE/BANDAGES/DRESSINGS) ×3 IMPLANT
BRR ADH 4X3 ABS CNTRL BYND (GAUZE/BANDAGES/DRESSINGS)
CANISTER SUCT 3000ML PPV (MISCELLANEOUS) ×3 IMPLANT
CLOSURE WOUND 1/2 X4 (GAUZE/BANDAGES/DRESSINGS) ×1
COVER WAND RF STERILE (DRAPES) ×3 IMPLANT
DECANTER SPIKE VIAL GLASS SM (MISCELLANEOUS) IMPLANT
DRAPE WARM FLUID 44X44 (DRAPES) IMPLANT
DRSG OPSITE POSTOP 4X10 (GAUZE/BANDAGES/DRESSINGS) ×3 IMPLANT
DURAPREP 26ML APPLICATOR (WOUND CARE) ×3 IMPLANT
GAUZE 4X4 16PLY RFD (DISPOSABLE) ×3 IMPLANT
GLOVE BIO SURGEON STRL SZ7.5 (GLOVE) ×1 IMPLANT
GLOVE SURG UNDER POLY LF SZ7 (GLOVE) ×6 IMPLANT
GOWN STRL REUS W/ TWL LRG LVL3 (GOWN DISPOSABLE) ×2 IMPLANT
GOWN STRL REUS W/ TWL XL LVL3 (GOWN DISPOSABLE) ×1 IMPLANT
GOWN STRL REUS W/TWL LRG LVL3 (GOWN DISPOSABLE) ×3
GOWN STRL REUS W/TWL XL LVL3 (GOWN DISPOSABLE) ×6
HEMOSTAT ARISTA ABSORB 3G PWDR (HEMOSTASIS) ×2 IMPLANT
HIBICLENS CHG 4% 4OZ BTL (MISCELLANEOUS) ×3 IMPLANT
KIT TURNOVER KIT B (KITS) ×3 IMPLANT
NEEDLE HYPO 22GX1.5 SAFETY (NEEDLE) ×1 IMPLANT
NS IRRIG 1000ML POUR BTL (IV SOLUTION) ×3 IMPLANT
PACK ABDOMINAL GYN (CUSTOM PROCEDURE TRAY) ×3 IMPLANT
PAD ARMBOARD 7.5X6 YLW CONV (MISCELLANEOUS) ×3 IMPLANT
PAD OB MATERNITY 4.3X12.25 (PERSONAL CARE ITEMS) ×3 IMPLANT
RTRCTR C-SECT PINK 25CM LRG (MISCELLANEOUS) ×2 IMPLANT
SPONGE LAP 18X18 RF (DISPOSABLE) ×10 IMPLANT
STRIP CLOSURE SKIN 1/2X4 (GAUZE/BANDAGES/DRESSINGS) ×2 IMPLANT
SUT PLAIN 2 0 (SUTURE) ×3
SUT PLAIN ABS 2-0 CT1 27XMFL (SUTURE) IMPLANT
SUT VIC AB 0 CT1 18XCR BRD8 (SUTURE) ×3 IMPLANT
SUT VIC AB 0 CT1 27 (SUTURE) ×6
SUT VIC AB 0 CT1 27XBRD ANBCTR (SUTURE) ×2 IMPLANT
SUT VIC AB 0 CT1 8-18 (SUTURE) ×6
SUT VIC AB 1 CT1 18XBRD ANBCTR (SUTURE) IMPLANT
SUT VIC AB 1 CT1 18XCR BRD 8 (SUTURE) IMPLANT
SUT VIC AB 1 CT1 36 (SUTURE) IMPLANT
SUT VIC AB 1 CT1 8-18 (SUTURE) ×3
SUT VIC AB 3-0 CT1 27 (SUTURE) ×3
SUT VIC AB 3-0 CT1 TAPERPNT 27 (SUTURE) ×1 IMPLANT
SUT VIC AB 3-0 SH 27 (SUTURE)
SUT VIC AB 3-0 SH 27X BRD (SUTURE) IMPLANT
SUT VIC AB 4-0 KS 27 (SUTURE) ×3 IMPLANT
SUT VICRYL 1 TIES 12X18 (SUTURE) ×3 IMPLANT
SYR CONTROL 10ML LL (SYRINGE) ×1 IMPLANT
TOWEL GREEN STERILE FF (TOWEL DISPOSABLE) ×6 IMPLANT
TRAY FOLEY W/BAG SLVR 14FR (SET/KITS/TRAYS/PACK) ×1 IMPLANT
YANKAUER SUCT BULB TIP NO VENT (SUCTIONS) ×2 IMPLANT

## 2021-05-22 NOTE — Progress Notes (Signed)
Correction to admit H & P. Pt does not have HIV or DM. This was entered in error.

## 2021-05-22 NOTE — Plan of Care (Signed)

## 2021-05-22 NOTE — Op Note (Signed)
Anna [name] PROCEDURE DATE: 05/22/2021  PREOPERATIVE DIAGNOSIS:  Symptomatic fibroids, menorrhagia POSTOPERATIVE DIAGNOSIS:  Symptomatic fibroids, menorrhagia SURGEON:  Legrand Como  M.D. Anna [name], M.D.  An experienced assistant was required given the standard of surgical care given the complexity of the case.  This assistant was needed for exposure, dissection, suctioning, retraction and for overall help during the procedure.   OPERATION:  Total abdominal hysterectomy, Bilateral Salpingectomy and left oophorectomy  ANESTHESIA:  General endotracheal. Plus a TAP block.  INDICATIONS: The patient is a 45 y.o. [id number] with the aforementioned diagnoses who desires definitive surgical management. On the preoperative visit, the risks, benefits, indications, and alternatives of the procedure were reviewed with the patient.  On the day of surgery, the risks of surgery were again discussed with the patient including but not limited to: bleeding which may require transfusion or reoperation; infection which may require antibiotics; injury to bowel, bladder, ureters or other surrounding organs; need for additional procedures; thromboembolic phenomenon, incisional problems and other postoperative/anesthesia complications. Written informed consent was obtained.    OPERATIVE FINDINGS: A 18 week size uterus with normal tubes. Left ovary adhered to uterus and normal right ovary.  ESTIMATED BLOOD LOSS: 350 ml FLUIDS: As recorded URINE OUTPUT:  As recorded, of clear yellow urine. SPECIMENS:  Uterus,cervix,  bilateral fallopian tubes and left ovary sent to pathology COMPLICATIONS:  None immediate.   DESCRIPTION OF PROCEDURE:  The patient received intravenous antibiotics and had sequential compression devices applied to her lower extremities while in the preoperative area.   She was taken to the operating room and placed under general anesthesia without difficulty.The abdomen and perineum were prepped and  draped in a sterile manner, and she was placed in a dorsal supine position.  A Foley catheter was inserted into the bladder and attached to constant drainage. After an adequate timeout was performed, a Pfannensteil skin incision was made. This incision was taken down to the fascia using electrocautery with care given to maintain good hemostasis. The fascia was incised in the midline and the fascial incision was then extended bilaterally using electrocautery without difficulty. The fascia was then dissected off the underlying rectus muscles using blunt and sharp dissection. The rectus muscles were split bluntly in the midline and the peritoneum entered sharply without complication. This peritoneal incision was then extended superiorly and inferiorly with care given to prevent bowel or bladder injury. Attention was then turned to the pelvis. A retractor was placed into the incision, and the bowel was packed away with moist laparotomy sponges. The uterus at this point was noted to be mobilized and was delivered up out of the abdomen.  The round ligaments on each side were clamped, suture ligated with 0 Vicryl, and transected with electrocautery allowing entry into the broad ligament. Of note, all sutures used in this procedure are 0 Vicryl unless otherwise noted. The anterior and posterior leaves of the broad ligament were separated, and the ureters were inspected to be safely away from the area of dissection bilaterally.  Adnexae were clamped on the patient's right side, cut, and doubly suture ligated. This procedure was repeated in an identical fashion on the left site. However it was noted that the left ovary was adhered to the uterus. Thus the left adnexa including the tube and ovary were removed with the uterus.  A bladder flap was then created.  The bladder was then bluntly dissected off the lower uterine segment and cervix with good hemostasis noted. The uterine arteries were then skeletonized bilaterally  and  then clamped, cut, and doubly suture ligated with care given to prevent ureteral injury. The uterus was then amputated across the lower uterine segment and passed off for pathology.  The uterosacral ligaments were then clamped, cut, and ligated bilaterally.  Finally, the cardinal ligaments were clamped, cut, and ligated bilaterally.  Acutely curved clamps were placed across the vagina just under the cervix, and the remaining portion of the specimen was amputated and sent to pathology. The vaginal cuff angles were closed with Heaney stiches with care given to incorporate the uterosacral-cardinal ligament pedicles on both sides. The middle of the vaginal cuff was closed with a series of interrupted figure-of-eight sutures with care given to incorporate the anterior pubocervical fascia and the posterior rectovaginal fascia.   The pelvis was irrigated and hemostasis was reconfirmed at all pedicles and along the pelvic sidewall.  The right tube was grasped with a Babcock, cross clamped, cut and suture ligated with a free tie. The tube was passed off for pathology. The ureters were inspected and noted to be peristalsing bilaterally.  All laparotomy sponges and instruments were removed from the abdomen. The peritoneum and rectus muscles were closed with 0 Vicryl. The fascia was also closed in a running fashion with 0 Vicryl. The subcutaneous layer was reapproximated with 2-0 plain gut. The skin was closed with a 4-0 Vicryl subcuticular stitch. Sponge, lap, needle, and instrument counts were correct times two. The patient was taken to the recovery area awake, extubated and in stable condition.  Arlina Robes MD, [place] Attending [place], [facility]

## 2021-05-22 NOTE — Anesthesia Procedure Notes (Addendum)
  Anesthesia Regional Block: TAP block   Pre-Anesthetic Checklist: , timeout performed,  Correct Patient, Correct Site, Correct Laterality,  Correct Procedure, Correct Position, site marked,  Risks and benefits discussed,  Surgical consent,  Pre-op evaluation,  At surgeon's request and post-op pain management  Laterality: Left  Prep: chloraprep       Needles:  Injection technique: Single-shot  Needle Type: Echogenic Stimulator Needle     Needle Length: 10cm  Needle Gauge: 21     Additional Needles:   Procedures:,,,, ultrasound used (permanent image in chart),,    Narrative:  Start time: 05/22/2021 11:43 AM End time: 05/22/2021 11:56 AM Injection made incrementally with aspirations every 5 mL.  Performed by: Personally

## 2021-05-22 NOTE — Interval H&P Note (Signed)
History and Physical Interval Note:  05/22/2021 12:27 PM  Anna [name]  has presented today for surgery, with the diagnosis of FIBROIDS.  The various methods of treatment have been discussed with the patient and family. After consideration of risks, benefits and other options for treatment, the patient has consented to  Procedure(s) with comments: HYSTERECTOMY ABDOMINAL WITH POSSIBLE SALPINGECTOMY (Bilateral) - TAP BLOCK as a surgical intervention.  The patient's history has been reviewed, patient examined, no change in status, stable for surgery.  I have reviewed the patient's chart and labs.  Questions were answered to the patient's satisfaction.     [name]

## 2021-05-22 NOTE — Anesthesia Procedure Notes (Signed)
Procedure Name: Intubation Date/Time: 05/22/2021 1:12 PM Performed by: Geraldine Contras, CRNA Pre-anesthesia Checklist: Patient identified, Patient being monitored, Timeout performed, Emergency Drugs available and Suction available Patient Re-evaluated:Patient Re-evaluated prior to induction Oxygen Delivery Method: Circle system utilized Preoxygenation: Pre-oxygenation with 100% oxygen Induction Type: IV induction Ventilation: Mask ventilation without difficulty Laryngoscope Size: Mac and 3 Grade View: Grade I Tube type: Oral Tube size: 7.0 mm Number of attempts: 1 Airway Equipment and Method: Stylet Placement Confirmation: ETT inserted through vocal cords under direct vision, positive ETCO2 and breath sounds checked- equal and bilateral Secured at: 21 cm Tube secured with: Tape Dental Injury: Teeth and Oropharynx as per pre-operative assessment

## 2021-05-22 NOTE — Transfer of Care (Signed)
Immediate Anesthesia Transfer of Care Note  Patient: Anna [name]  Procedure(s) Performed: HYSTERECTOMY ABDOMINAL WITH SALPINGECTOMY AND LEFT OOPHORECTOMY (Bilateral: Abdomen)  Patient Location: PACU  Anesthesia Type:General  Level of Consciousness: sedated  Airway & Oxygen Therapy: Patient Spontanous Breathing  Post-op Assessment: Report given to RN  Post vital signs: stable  Last Vitals:  Vitals Value Taken Time  BP 143/81 05/22/21 1501  Temp    Pulse 73 05/22/21 1504  Resp 19 05/22/21 1504  SpO2 100 % 05/22/21 1504  Vitals shown include unvalidated device data.  Last Pain:  Vitals:   05/22/21 1107  TempSrc:   PainSc: 0-No pain         Complications: No notable events documented.

## 2021-05-22 NOTE — Anesthesia Procedure Notes (Signed)
Anesthesia Regional Block: TAP block   Pre-Anesthetic Checklist: , timeout performed,  Correct Patient, Correct Site, Correct Laterality,  Correct Procedure, Correct Position, site marked,  Risks and benefits discussed,  Surgical consent,  Pre-op evaluation,  At surgeon's request and post-op pain management  Laterality: Right  Prep: chloraprep       Needles:  Injection technique: Single-shot  Needle Type: Echogenic Stimulator Needle     Needle Length: 10cm  Needle Gauge: 21     Additional Needles:   Procedures:,,,, ultrasound used (permanent image in chart),,    Narrative:  Start time: 05/22/2021 11:57 AM End time: 05/22/2021 12:03 PM Injection made incrementally with aspirations every 5 mL.  Performed by: Personally

## 2021-05-22 NOTE — Anesthesia Postprocedure Evaluation (Signed)
Anesthesia Post Note  Patient: Anna [name]  Procedure(s) Performed: HYSTERECTOMY ABDOMINAL WITH SALPINGECTOMY AND LEFT OOPHORECTOMY (Bilateral: Abdomen)     Patient location during evaluation: PACU Anesthesia Type: General Level of consciousness: sedated Pain management: pain level controlled Vital Signs Assessment: post-procedure vital signs reviewed and stable Respiratory status: spontaneous breathing and respiratory function stable Cardiovascular status: stable Postop Assessment: no apparent nausea or vomiting Anesthetic complications: no   No notable events documented.  Last Vitals:  Vitals:   05/22/21 1626 05/22/21 1630  BP: 117/80 117/80  Pulse: 72 74  Resp: (!) 7 (!) 5  Temp: 36.6 C   SpO2: 92% 99%    Last Pain:  Vitals:   05/22/21 1630  TempSrc:   PainSc: Asleep                 [name] DANIEL

## 2021-05-23 ENCOUNTER — Telehealth: Payer: Self-pay | Admitting: *Deleted

## 2021-05-23 ENCOUNTER — Encounter (HOSPITAL_COMMUNITY): Payer: Self-pay | Admitting: Obstetrics and Gynecology

## 2021-05-23 DIAGNOSIS — D259 Leiomyoma of uterus, unspecified: Secondary | ICD-10-CM | POA: Diagnosis not present

## 2021-05-23 LAB — TYPE AND SCREEN
ABO/RH(D): O NEG
Antibody Screen: NEGATIVE
Unit division: 0
Unit division: 0

## 2021-05-23 LAB — BPAM RBC
Blood Product Expiration Date: 202206202359
Blood Product Expiration Date: 202206202359
ISSUE DATE / TIME: 202206141555
ISSUE DATE / TIME: 202206142011
Unit Type and Rh: 9500
Unit Type and Rh: 9500

## 2021-05-23 LAB — CBC
HCT: 30.2 % — ABNORMAL LOW (ref 36.0–46.0)
Hemoglobin: 9.3 g/dL — ABNORMAL LOW (ref 12.0–15.0)
MCH: 22.9 pg — ABNORMAL LOW (ref 26.0–34.0)
MCHC: 30.8 g/dL (ref 30.0–36.0)
MCV: 74.2 fL — ABNORMAL LOW (ref 80.0–100.0)
Platelets: 348 10*3/uL (ref 150–400)
RBC: 4.07 MIL/uL (ref 3.87–5.11)
RDW: 25.4 % — ABNORMAL HIGH (ref 11.5–15.5)
WBC: 10.4 10*3/uL (ref 4.0–10.5)
nRBC: 0 % (ref 0.0–0.2)

## 2021-05-23 LAB — SURGICAL PATHOLOGY

## 2021-05-23 LAB — BASIC METABOLIC PANEL
Anion gap: 8 (ref 5–15)
BUN: 11 mg/dL (ref 6–20)
CO2: 22 mmol/L (ref 22–32)
Calcium: 8.6 mg/dL — ABNORMAL LOW (ref 8.9–10.3)
Chloride: 106 mmol/L (ref 98–111)
Creatinine, Ser: 0.92 mg/dL (ref 0.44–1.00)
GFR, Estimated: >60 mL/min (ref >60)
Glucose, Bld: 131 mg/dL — ABNORMAL HIGH (ref 70–99)
Potassium: 4 mmol/L (ref 3.5–5.1)
Sodium: 136 mmol/L (ref 135–145)

## 2021-05-23 NOTE — Telephone Encounter (Signed)
Received a voice message this afternoon who states she is Donette Larry, UR specialist at [facility] and needs to get in touch with Dr. Rip Harbour. States patient is in patient now - came in for Abdominal Hysterectomy and is in patient now. States however no prior [place].  Will forward to Lamont Snowball to coordinate with Dr. Rip Harbour. Jerame Hedding,RN

## 2021-05-23 NOTE — Telephone Encounter (Signed)
Call returned to [place]. Dr Rip Harbour has already resolved issues.

## 2021-05-23 NOTE — Progress Notes (Signed)
1 Day Post-Op Procedure(s) (LRB): HYSTERECTOMY ABDOMINAL WITH SALPINGECTOMY AND LEFT OOPHORECTOMY (Bilateral)  Subjective: Patient has no complaints this morning. Pain controlled. Tolerating diet.  Objective: AF VSS  Lungs clear Heart RRR Abd soft + BS drsg intact Ext non tender   Assessment: s/p Procedure(s) with comments: HYSTERECTOMY ABDOMINAL WITH SALPINGECTOMY AND LEFT OOPHORECTOMY (Bilateral) - TAP BLOCK:   Plan: D/C foley. Continue with progressive care  LOS: 1 day    [name] 05/23/2021, 9:09 AM

## 2021-05-24 DIAGNOSIS — D259 Leiomyoma of uterus, unspecified: Secondary | ICD-10-CM | POA: Diagnosis not present

## 2021-05-24 MED ORDER — IBUPROFEN 800 MG PO TABS: 800.0000 mg | ORAL_TABLET | Freq: Three times a day (TID) | ORAL | 1 refills | Status: DC

## 2021-05-24 MED ORDER — OXYCODONE-ACETAMINOPHEN 5-325 MG PO TABS: 1.0000 | ORAL_TABLET | ORAL | 0 refills | Status: DC | PRN

## 2021-05-24 NOTE — Progress Notes (Signed)
Discharge teaching given to patient. Patient verbalized understanding of the teaching. Patient currently waiting on her mother to pick her up for discharge.

## 2021-05-24 NOTE — Discharge Summary (Signed)
Physician Discharge Summary  Patient ID: Anna [name] MRN: [id number] DOB/AGE: 1976/11/21 45 y.o.  Admit date: 05/22/2021 Discharge date: 05/24/2021  Admission Diagnoses: Uterine Fibroids  Discharge Diagnoses:  Active Problems:   Post-operative state   Discharged Condition: good  Hospital Course: Anna [name] was admitted with above Dx and underwent TAH with BS and Left oophorectomy. See OP note for additional information. Post op course was unremarkable. She progressed to ambulating, voiding, tolerating diet, + flatus and good oral pain control. Felt amendable for discharge home. Discharge instructions, medications and follow up reviewed with pt. Pt verbalized understanding.   Consults: None  Significant Diagnostic Studies: labs  Treatments: surgery: TAH/BS/Lo  Discharge Exam: Blood pressure (!) 148/85, pulse 77, temperature 98.7 F (37.1 C), temperature source Oral, resp. rate 19, height 5\' 6"  (1.676 m), weight 75.8 kg, last menstrual period 04/12/2021, SpO2 100 %.  Lungs clear Heart RRR Abd soft + BS drsg intact Ext non tedner   Disposition: Discharge disposition: 01-Home or Self Care       Discharge Instructions     Call MD for:  difficulty breathing, headache or visual disturbances   Complete by: As directed    Call MD for:  extreme fatigue   Complete by: As directed    Call MD for:  hives   Complete by: As directed    Call MD for:  persistant dizziness or light-headedness   Complete by: As directed    Call MD for:  persistant nausea and vomiting   Complete by: As directed    Call MD for:  redness, tenderness, or signs of infection (pain, swelling, redness, odor or Dittman/yellow discharge around incision site)   Complete by: As directed    Call MD for:  severe uncontrolled pain   Complete by: As directed    Call MD for:  temperature >100.4   Complete by: As directed    Diet - low sodium heart healthy   Complete by: As directed    Discharge wound care:    Complete by: As directed    Remove dressing this Tuesday   Increase activity slowly   Complete by: As directed    Sexual Activity Restrictions   Complete by: As directed    Pelvic rest x 4 weeks      Allergies as of 05/24/2021       Reactions   Latex Hives, Swelling   Peanut-containing Drug Products Itching        Medication List     STOP taking these medications    megestrol 40 MG tablet Commonly known as: MEGACE       TAKE these medications    FeroSul 325 (65 FE) MG tablet Generic drug: ferrous sulfate Take 1 tablet (325 mg total) by mouth every other day.   ibuprofen 800 MG tablet Commonly known as: ADVIL Take 1 tablet (800 mg total) by mouth 3 (three) times daily.   oxyCODONE-acetaminophen 5-325 MG tablet Commonly known as: PERCOCET/ROXICET Take 1 tablet by mouth every 4 (four) hours as needed for moderate pain.   triamcinolone 0.025 % ointment Commonly known as: KENALOG Apply 1 application topically 3 (three) times daily.               Discharge Care Instructions  (From admission, onward)           Start     Ordered   05/24/21 0000  Discharge wound care:       Comments: Remove dressing this Tuesday   05/24/21  [place] [facility] at [facility]. Schedule an appointment as soon as possible for a visit in 4 week(s).   Specialty: Obstetrics and Gynecology Why: Post op appt with Dr. Gardiner Fanti Contact information: [place] [address] [phone]                Signed: [name] 05/24/2021, 12:13 PM

## 2021-05-24 NOTE — Progress Notes (Signed)
Patient discharged to home with all belongings Via wheelchair

## 2021-05-29 ENCOUNTER — Other Ambulatory Visit: Payer: Self-pay | Admitting: Obstetrics and Gynecology

## 2021-05-29 ENCOUNTER — Ambulatory Visit: Payer: Medicaid Other | Admitting: Clinical

## 2021-05-29 ENCOUNTER — Telehealth: Payer: Self-pay

## 2021-05-29 DIAGNOSIS — D219 Benign neoplasm of connective and other soft tissue, unspecified: Secondary | ICD-10-CM

## 2021-05-29 DIAGNOSIS — Z658 Other specified problems related to psychosocial circumstances: Secondary | ICD-10-CM

## 2021-05-29 MED ORDER — OXYCODONE-ACETAMINOPHEN 5-325 MG PO TABS: 1.0000 | ORAL_TABLET | ORAL | 0 refills | Status: DC | PRN

## 2021-05-29 NOTE — Patient Instructions (Signed)
[facility] at [facility] [address] [place], [place] [address] [phone] (main office) [phone] (Sigmond Patalano's office)   

## 2021-05-29 NOTE — Telephone Encounter (Addendum)
-----   Message from Garlan Fair, [place] sent at 05/29/2021  9:27 AM EDT ----- Regarding: Post-surgery urgent questions Pt is requesting the following:   # "white netted thing came off", what is that, and should it have come off?  # doesn't know if "the honeycomb thing" came off or not, "don't know what's going on down there"  #has one pain pill left, in a lot of pain right now, and afraid to take as it's the last one; requesting refill   She may need a call-back from clinical staff to address her medical concerns.   -----  Pain medication refilled by Rip Harbour, MD. Called pt to discuss other concerns. Reviewed good wound care and s/s of infection.  Apolonio Schneiders RN  05/29/21

## 2021-06-06 ENCOUNTER — Telehealth: Payer: Self-pay | Admitting: Clinical

## 2021-06-06 NOTE — Telephone Encounter (Signed)
Follow-up call as agreed upon by pt and [facility]; pt is feeling much better than one week ago, is currently resting and agrees to call [facility] as needed and/or to schedule any follow up visits, at [phone].

## 2021-06-20 ENCOUNTER — Other Ambulatory Visit: Payer: Self-pay

## 2021-06-20 ENCOUNTER — Ambulatory Visit (INDEPENDENT_AMBULATORY_CARE_PROVIDER_SITE_OTHER): Payer: 59 | Admitting: Obstetrics and Gynecology

## 2021-06-20 ENCOUNTER — Ambulatory Visit (INDEPENDENT_AMBULATORY_CARE_PROVIDER_SITE_OTHER): Payer: 59 | Admitting: Clinical

## 2021-06-20 ENCOUNTER — Encounter: Payer: Self-pay | Admitting: Obstetrics and Gynecology

## 2021-06-20 VITALS — BP 122/105 | HR 70

## 2021-06-20 DIAGNOSIS — F322 Major depressive disorder, single episode, severe without psychotic features: Secondary | ICD-10-CM | POA: Diagnosis not present

## 2021-06-20 DIAGNOSIS — Z9889 Other specified postprocedural states: Secondary | ICD-10-CM

## 2021-06-20 DIAGNOSIS — Z90710 Acquired absence of both cervix and uterus: Secondary | ICD-10-CM

## 2021-06-20 DIAGNOSIS — D649 Anemia, unspecified: Secondary | ICD-10-CM

## 2021-06-20 DIAGNOSIS — Z1231 Encounter for screening mammogram for malignant neoplasm of breast: Secondary | ICD-10-CM

## 2021-06-20 DIAGNOSIS — Z90721 Acquired absence of ovaries, unilateral: Secondary | ICD-10-CM

## 2021-06-20 LAB — CBC
Hematocrit: 33.9 % — ABNORMAL LOW (ref 34.0–46.6)
Hemoglobin: 10.6 g/dL — ABNORMAL LOW (ref 11.1–15.9)
MCH: 23 pg — ABNORMAL LOW (ref 26.6–33.0)
MCHC: 31.3 g/dL — ABNORMAL LOW (ref 31.5–35.7)
MCV: 74 fL — ABNORMAL LOW (ref 79–97)
Platelets: 411 10*3/uL (ref 150–450)
RBC: 4.61 x10E6/uL (ref 3.77–5.28)
RDW: 21.7 % — ABNORMAL HIGH (ref 11.7–15.4)
WBC: 6.1 10*3/uL (ref 3.4–10.8)

## 2021-06-20 NOTE — Progress Notes (Signed)
Anna [name] presents for post op from TAH/BS on 05/22/2021. She has no complaints today Denies any bowel or bladder dysfunction. Pathology reviewed  PE AF VSS Lungs clear  Heart RRR Abd soft + BS incision well healed  A/P Post op visit        Anemia        Screening mammogram        + Depression score  Will check CBC. Mammogram ordered. To see Roselyn Reef today. Return to normal ADL's. F/U PRN or in 1 yr.

## 2021-06-20 NOTE — Patient Instructions (Signed)
Health Maintenance, Female Adopting a healthy lifestyle and getting preventive care are important in promoting health and wellness. Ask your health care provider about: The right schedule for you to have regular tests and exams. Things you can do on your own to prevent diseases and keep yourself healthy. What should I know about diet, weight, and exercise? Eat a healthy diet  Eat a diet that includes plenty of vegetables, fruits, low-fat dairy products, and lean protein. Do not eat a lot of foods that are high in solid fats, added sugars, or sodium.  Maintain a healthy weight Body mass index (BMI) is used to identify weight problems. It estimates body fat based on height and weight. Your health care provider can help determineyour BMI and help you achieve or maintain a healthy weight. Get regular exercise Get regular exercise. This is one of the most important things you can do for your health. Most adults should: Exercise for at least 150 minutes each week. The exercise should increase your heart rate and make you sweat (moderate-intensity exercise). Do strengthening exercises at least twice a week. This is in addition to the moderate-intensity exercise. Spend less time sitting. Even light physical activity can be beneficial. Watch cholesterol and blood lipids Have your blood tested for lipids and cholesterol at 47 years of age, then havethis test every 5 years. Have your cholesterol levels checked more often if: Your lipid or cholesterol levels are high. You are older than 47 years of age. You are at high risk for heart disease. What should I know about cancer screening? Depending on your health history and family history, you may need to have cancer screening at various ages. This may include screening for: Breast cancer. Cervical cancer. Colorectal cancer. Skin cancer. Lung cancer. What should I know about heart disease, diabetes, and high blood pressure? Blood pressure and heart  disease High blood pressure causes heart disease and increases the risk of stroke. This is more likely to develop in people who have high blood pressure readings, are of African descent, or are overweight. Have your blood pressure checked: Every 3-5 years if you are 18-39 years of age. Every year if you are 40 years old or older. Diabetes Have regular diabetes screenings. This checks your fasting blood sugar level. Have the screening done: Once every three years after age 40 if you are at a normal weight and have a low risk for diabetes. More often and at a younger age if you are overweight or have a high risk for diabetes. What should I know about preventing infection? Hepatitis B If you have a higher risk for hepatitis B, you should be screened for this virus. Talk with your health care provider to find out if you are at risk forhepatitis B infection. Hepatitis C Testing is recommended for: Everyone born from 1945 through 1965. Anyone with known risk factors for hepatitis C. Sexually transmitted infections (STIs) Get screened for STIs, including gonorrhea and chlamydia, if: You are sexually active and are younger than 47 years of age. You are older than 47 years of age and your health care provider tells you that you are at risk for this type of infection. Your sexual activity has changed since you were last screened, and you are at increased risk for chlamydia or gonorrhea. Ask your health care provider if you are at risk. Ask your health care provider about whether you are at high risk for HIV. Your health care provider may recommend a prescription medicine to help   prevent HIV infection. If you choose to take medicine to prevent HIV, you should first get tested for HIV. You should then be tested every 3 months for as long as you are taking the medicine. Pregnancy If you are about to stop having your period (premenopausal) and you may become pregnant, seek counseling before you get  pregnant. Take 400 to 800 micrograms (mcg) of folic acid every day if you become pregnant. Ask for birth control (contraception) if you want to prevent pregnancy. Osteoporosis and menopause Osteoporosis is a disease in which the bones lose minerals and strength with aging. This can result in bone fractures. If you are 65 years old or older, or if you are at risk for osteoporosis and fractures, ask your health care provider if you should: Be screened for bone loss. Take a calcium or vitamin D supplement to lower your risk of fractures. Be given hormone replacement therapy (HRT) to treat symptoms of menopause. Follow these instructions at home: Lifestyle Do not use any products that contain nicotine or tobacco, such as cigarettes, e-cigarettes, and chewing tobacco. If you need help quitting, ask your health care provider. Do not use street drugs. Do not share needles. Ask your health care provider for help if you need support or information about quitting drugs. Alcohol use Do not drink alcohol if: Your health care provider tells you not to drink. You are pregnant, may be pregnant, or are planning to become pregnant. If you drink alcohol: Limit how much you use to 0-1 drink a day. Limit intake if you are breastfeeding. Be aware of how much alcohol is in your drink. In the [place]., one drink equals one 12 oz bottle of beer (355 mL), one 5 oz glass of wine (148 mL), or one 1 oz glass of hard liquor (44 mL). General instructions Schedule regular health, dental, and eye exams. Stay current with your vaccines. Tell your health care provider if: You often feel depressed. You have ever been abused or do not feel safe at home. Summary Adopting a healthy lifestyle and getting preventive care are important in promoting health and wellness. Follow your health care provider's instructions about healthy diet, exercising, and getting tested or screened for diseases. Follow your health care provider's  instructions on monitoring your cholesterol and blood pressure. This information is not intended to replace advice given to you by your health care provider. Make sure you discuss any questions you have with your healthcare provider. Document Revised: 11/18/2018 Document Reviewed: 11/18/2018 Elsevier Patient Education  2022 Elsevier Inc.  

## 2021-06-20 NOTE — BH Specialist Note (Signed)
Integrated Behavioral Health Follow Up In-Person Visit  MRN: [id number] Name: Anna [name]  Number of [place] Clinician visits: 3/6 Session Start time: 10:12  Session End time: 10:50 Total time:  38  minutes  Types of Service: Individual psychotherapy  Interpretor:No. Interpretor Name and Language: n/a  Subjective: Anna [name] is a 45 y.o. female accompanied by  n/a Patient was referred by Anna Robes, MD for positive depression screen. Patient reports the following symptoms/concerns: Pt states her primary concern today is stress over housing instability, relationship with husband in separation, and adjusting to post-surgery; feeling depressed with SI thoughts; denies intent or plan; pt feels it's helpful to process feelings today and begin looking forward with hope for the future.  Duration of problem: Increasing over time; Severity of problem: severe  Objective: Mood: Depressed and Affect: Depressed Risk of harm to self or others: Suicidal ideation Self-harm thoughts No plan to harm self or others  Life Context: Family and Social: Pt lives by herself; mother is supportive School/Work: Working part-time post-surgery Self-Care: Recognizing a greater need for self-care Life Changes: In marital separation; recent surgery; housing instability  Patient and/or Family's Strengths/Protective Factors: Social connections and Sense of purpose  Goals Addressed: Patient will:  Reduce symptoms of: anxiety, depression, and stress   Increase knowledge and/or ability of: healthy habits and stress reduction   Demonstrate ability to: Increase healthy adjustment to current life circumstances and Increase motivation to adhere to plan of care  Progress towards Goals: Ongoing  Interventions: Interventions utilized:  Solution-Focused Strategies, Psychoeducation and/or Health Education, Link to [employer], and Safety Standardized Assessments completed: C-SSRS  Short, GAD-7, and PHQ 9  Patient and/or Family Response: Pt states she has had suicidal thoughts in recent weeks, but denies intent to harm herself in any way, as she doesn't like pain and would not do that to her mother, and realizes she has things to look forward to in life; holding onto hope for an improved future.   Patient Centered Plan: Patient is on the following Treatment Plan(s): IBH Assessment: Patient currently experiencing Major depressive disorder, single episode, severe, without psychotic features.   Patient may benefit from psychoeducation and brief therapeutic interventions regarding coping with symptoms of depression, anxiety, stress .  Plan: Follow up with behavioral health clinician on : [facility] will call in one day; may call Anna [name] at [phone] as needed Behavioral recommendations:  -Use community resources available for housing support for housing; consider getting on emergency shelter list as part of back-up plan, as discussed -Accept referral to [facility]; expect a call to set up initial appointment  -Follow Safety Plan -Consider gathering items needed to obtain a passport, the first step towards future travel -While waiting for future travel, consider ways to "travel" to location of choice in other ways (ex. Find or learn to prepare [place] food/ find movies to watch located in [place], etc). Focus on small steps you can take right now, rather than steps that you cannot take until later.  Referral(s): Integrated Orthoptist (In Clinic), [place] (LME/Outside Clinic), and [employer] Resources:  [place], [place]

## 2021-06-20 NOTE — Patient Instructions (Signed)
[facility] for [facility] at [facility] for Women [place], [place] [address] [phone] (main office) [phone] [facility] office)  [place] (serves [place], [place], [place], [place], [place], [place], [place], [place], [place], [place], [place], [place], and [place] counties) [address], [place], [place] [address] [phone] [email or web address]  **Rental assistance, Home Rehabilitation,Weatherization Assistance Program, Forensic psychologist, Housing Voucher Program  [employer] for Housing and [employer] Studies: Chief Financial Officer Resources to residents of [place], [place], and [place] Make sure you have your documents ready, including:  (Household income verification: 2 months pay stubs, unemployment/social security award letter, statement of no income for all household members over 20) Photo ID for all household members over 18 Utility Bill/Rent Ledger/Lease: must show past due amount for utilities/rent, or the rental agreement if rent is current 2. Start your application online or by paper (in [place] or [place]) at:     http://boyd-[email or web address]  3. Once you have completed the online application, you will get an email confirmation message from the county. Expect to hear back by phone or email at least 6-10 weeks from submitting your application.  4. While you wait:  Call [phone] to check in on your application Let your landlord know that you've applied. Your landlord will be asked to submit documents (W-9) during this application process. Payments will be made directly to the landlord/property [place] or utility assistance for [employer], [facility], and [place] at [email or web address]/[email or web address] Questions? Call or email Renee at  [phone] or [email or web address]   Eviction Mediation Program: The HOPE Program Https://www.rebuild.http://mills-williams.net/ HOPE Progam serves low-income renters in [place] counties, defined as less than or equal to 80% of the area median income for the county where the renter lives. In the following 12 counties, you should apply to your local rent and utility assistance program INSTEAD OF the HOPE Program: [place], [place], [place], [place], [place], [place], [place], [place], [place], [place], [place]  If you live outside of [place], contact [place] call center at [phone] to talk to a Program Representative Monday-Friday, 8am-5pm Note that Native American tribes also received federal funding for rent and utility assistance programs. Recognized members of the following tribes will be served by programs managed by tribal governments, including: [place] Indians, [place], [place], [place] of [place] and [place], [place], [phone] [email or web address] .[place], [phone] [email or web address] .[place] Schleicher [address] [place], [place] [address] [phone] www.gha-[place].[facility] [address] [place], [place] [address] [phone] https://manning.com/ **Programs include: Engineer, building services and Housing Counseling, Healthy Doctor, general practice, Homeless Prevention and [place] [address] [place], [place], [place] [address] [phone] www.https://www.farmer-stevens.info/ **housing applications/recertification; tax payment relief/exemption under specific qualifications  [facility] [address] [place], [place] [address] www.onlinegreensboro.com/~maryshouse **transitional housing for  women in recovery who have minor children or are pregnant  [place], [place], [place] [address] ArtistMovie.se  **emergency shelter and support services for families facing homelessness  [place] [address] [place], [place] [address] [phone] www.youthfocus.org **transitional housing to pregnant women; emergency housing for youth who have run away, are experiencing a family  crisis, are victims of abuse or neglect, or are homeless  [facility] [address], [place], [place] [address] [phone] ircgso.org **Drop-in center for people experiencing homelessness; overnight warming center when temperature is 25 degrees or below  Re-Entry Staffing [place], [place], [place] [address] [phone] https://reentrystaffingagency.org/ **help with affordable housing to people experiencing homelessness or unemployment due to incarceration  [facility] [address] [place], [place] [address] [phone] www.greensborourbanministry.org  **emergency and transitional housing, rent/mortgage assistance, utility assistance  [facility] [address], [place], [place] [address] [phone] www.salvationarmyofgreensboro.org **emergency and transitional housing  Habitat for [employer] [place], [place], [place] [address] [phone] Www.habitatgreensboro.[place], [place], [place] [address] [phone] https://chshousing.org **[place] [facility]  [facility] [address] [place] [address], [place], [place]  [address] [phone] arrivance.com **home buyer education courses, foreclosure prevention  [facility] [address] [place], [place] [address] [phone] WirelessNovelties.no **Environmental Exposure Assessment (investigation of homes where either children or pregnant women with a confirmed elevated blood lead level reside)  [facility] [place] of [facility]ocational [facility] [place], [place], [place] [address] [phone] http://www.perez.com/ **Home Expense Assistance/Repairs Program; offers home accessibility updates, such as ramps or bars in the bathroom  [facility] [address], [place], [place] [address] [phone] https://www.self-help.org/locations/[place]-branch **Offers credit-building and banking services to people unable to use [place] of [facility] [place], [place], [place] [address] [phone] RoulettePays.com.br   [facility] [address] [place], [place] [address] [phone]  ClubMonetize.fr **housing applications/recertification, emergency and transitional housing  Open [employer] of [employer] [address], [place], [place] [address] [phone] www.odm-hp.org  **emergency and permanent housing; rent/mortgage payment assistance  Habitat for [employer], [place] and [place] [address] [place], [place] [address] [phone] ArchitectReviews.com.au  [facility] Services of the [place], [employer] [place], [place], [place] [address] www.familyservice-piedmont.org **emergency shelter for victims of domestic violence and sexual assault  [facility] [address] [place], [place] [address] [phone] www.senior-resources-[place]il[place].org **Home expense assistance/repairs for older [place] of [place], [place], [place]  [address] [phone] NameDisc.is  **May offer help with minor housing repairs  [facility] [address], [place], [place] [address] [phone] **emergency housing for victims of domestic violence  Housing Resources [place], [place], [place] [facility])  [facility] [address], [place], [place] [address] [phone] www.newrha.[facility] [address] [place], [place] [address] ([place] [address] [place], [place], [place] [address] [phone] www.co.[place]..us **Housing applications/recertification; tax payment relief/exemption w specific qualifications  [place] for Homeless [address] [place],  [address] [phone] Http://www.rchelpforhomeless.org/HOME_PAGE.html  HELP, [place] [address] [place],  [address] [phone] [place] [phone] Hours of Operation: Monday-Friday, 8:30am-5:00pm http://helpincorporated.org **Includes emergency housing for victims of domestic violence

## 2021-06-21 ENCOUNTER — Telehealth: Payer: Self-pay | Admitting: Lactation Services

## 2021-06-21 NOTE — Telephone Encounter (Signed)
Patient called and LM that she would like results from blood work yesterday.   Returned patients call. She was informed her Hgb is a little low but much better than in the past. She is continuing to take her Fe every other day. Advised that Dr. Rip Harbour will send her a message via My Chart if he has any other recommendation.   Patient voiced understanding and reports she has no other questions or concerns at this time.

## 2022-05-29 ENCOUNTER — Ambulatory Visit (HOSPITAL_COMMUNITY)
Admission: EM | Admit: 2022-05-29 | Discharge: 2022-05-30 | Disposition: A | Discharge status: Other Acute Inpatient Hospital | Payer: No Payment, Other | Attending: Family | Admitting: Family

## 2022-05-29 ENCOUNTER — Encounter (HOSPITAL_COMMUNITY): Payer: Self-pay | Admitting: Emergency Medicine

## 2022-05-29 DIAGNOSIS — Z20822 Contact with and (suspected) exposure to covid-19: Secondary | ICD-10-CM | POA: Insufficient documentation

## 2022-05-29 DIAGNOSIS — F332 Major depressive disorder, recurrent severe without psychotic features: Secondary | ICD-10-CM | POA: Insufficient documentation

## 2022-05-29 DIAGNOSIS — Z5900 Homelessness unspecified: Secondary | ICD-10-CM | POA: Insufficient documentation

## 2022-05-29 LAB — CBC WITH DIFFERENTIAL/PLATELET
Abs Immature Granulocytes: 0.02 10*3/uL (ref 0.00–0.07)
Basophils Absolute: 0 10*3/uL (ref 0.0–0.1)
Basophils Relative: 1 %
Eosinophils Absolute: 0.2 10*3/uL (ref 0.0–0.5)
Eosinophils Relative: 3 %
HCT: 40.8 % (ref 36.0–46.0)
Hemoglobin: 13.1 g/dL (ref 12.0–15.0)
Immature Granulocytes: 0 %
Lymphocytes Relative: 22 %
Lymphs Abs: 1.5 10*3/uL (ref 0.7–4.0)
MCH: 27.8 pg (ref 26.0–34.0)
MCHC: 32.1 g/dL (ref 30.0–36.0)
MCV: 86.6 fL (ref 80.0–100.0)
Monocytes Absolute: 0.4 10*3/uL (ref 0.1–1.0)
Monocytes Relative: 7 %
Neutro Abs: 4.4 10*3/uL (ref 1.7–7.7)
Neutrophils Relative %: 67 %
Platelets: 382 10*3/uL (ref 150–400)
RBC: 4.71 MIL/uL (ref 3.87–5.11)
RDW: 14.6 % (ref 11.5–15.5)
WBC: 6.6 10*3/uL (ref 4.0–10.5)
nRBC: 0 % (ref 0.0–0.2)

## 2022-05-29 LAB — POC SARS CORONAVIRUS 2 AG: SARSCOV2ONAVIRUS 2 AG: NEGATIVE

## 2022-05-29 LAB — COMPREHENSIVE METABOLIC PANEL
ALT: 13 U/L (ref 0–44)
AST: 21 U/L (ref 15–41)
Albumin: 4.3 g/dL (ref 3.5–5.0)
Alkaline Phosphatase: 71 U/L (ref 38–126)
Anion gap: 10 (ref 5–15)
BUN: 12 mg/dL (ref 6–20)
CO2: 28 mmol/L (ref 22–32)
Calcium: 10.1 mg/dL (ref 8.9–10.3)
Chloride: 102 mmol/L (ref 98–111)
Creatinine, Ser: 0.85 mg/dL (ref 0.44–1.00)
GFR, Estimated: >60 mL/min (ref >60)
Glucose, Bld: 84 mg/dL (ref 70–99)
Potassium: 4.1 mmol/L (ref 3.5–5.1)
Sodium: 140 mmol/L (ref 135–145)
Total Bilirubin: 0.4 mg/dL (ref 0.3–1.2)
Total Protein: 7.5 g/dL (ref 6.5–8.1)

## 2022-05-29 LAB — POCT URINE DRUG SCREEN - MANUAL ENTRY (I-SCREEN)
POC Amphetamine UR: NOT DETECTED
POC Buprenorphine (BUP): NOT DETECTED
POC Cocaine UR: POSITIVE — AB
POC Marijuana UR: POSITIVE — AB
POC Methadone UR: NOT DETECTED
POC Methamphetamine UR: NOT DETECTED
POC Morphine: NOT DETECTED
POC Oxazepam (BZO): NOT DETECTED
POC Oxycodone UR: NOT DETECTED
POC Secobarbital (BAR): NOT DETECTED

## 2022-05-29 LAB — LIPID PANEL
Cholesterol: 202 mg/dL — ABNORMAL HIGH (ref 0–200)
HDL: 103 mg/dL (ref >40)
LDL Cholesterol: 83 mg/dL (ref 0–99)
Total CHOL/HDL Ratio: 2 RATIO
Triglycerides: 78 mg/dL (ref <150)
VLDL: 16 mg/dL (ref 0–40)

## 2022-05-29 LAB — HEMOGLOBIN A1C
Hgb A1c MFr Bld: 5.1 % (ref 4.8–5.6)
Mean Plasma Glucose: 99.67 mg/dL

## 2022-05-29 LAB — RESP PANEL BY RT-PCR (FLU A&B, COVID) ARPGX2
Influenza A by PCR: NEGATIVE
Influenza B by PCR: NEGATIVE
SARS Coronavirus 2 by RT PCR: NEGATIVE

## 2022-05-29 LAB — TSH: TSH: 0.243 u[IU]/mL — ABNORMAL LOW (ref 0.350–4.500)

## 2022-05-29 LAB — ETHANOL: Alcohol, Ethyl (B): <10 mg/dL (ref <10)

## 2022-05-29 LAB — MAGNESIUM: Magnesium: 2.1 mg/dL (ref 1.7–2.4)

## 2022-05-29 MED ORDER — HYDROXYZINE HCL 25 MG PO TABS
25.0000 mg | ORAL_TABLET | Freq: Three times a day (TID) | ORAL | Status: DC | PRN
  Administered 2022-05-29 – 2022-05-30 (×2): 25 mg via ORAL
  Filled 2022-05-29 (×2): qty 1

## 2022-05-29 MED ORDER — ALUM & MAG HYDROXIDE-SIMETH 200-200-20 MG/5ML PO SUSP: 30.0000 mL | ORAL | Status: DC | PRN

## 2022-05-29 MED ORDER — MAGNESIUM HYDROXIDE 400 MG/5ML PO SUSP: 30.0000 mL | Freq: Every day | ORAL | Status: DC | PRN

## 2022-05-29 MED ORDER — TRAZODONE HCL 50 MG PO TABS
50.0000 mg | ORAL_TABLET | Freq: Every evening | ORAL | Status: DC | PRN
  Administered 2022-05-29: 50 mg via ORAL
  Filled 2022-05-29: qty 1

## 2022-05-29 MED ORDER — ACETAMINOPHEN 325 MG PO TABS: 650.0000 mg | ORAL_TABLET | Freq: Four times a day (QID) | ORAL | Status: DC | PRN

## 2022-05-29 NOTE — ED Notes (Signed)
Pt sleeping at present, no distress noted. Respirations even & unlabored.  Monitoring for safety. 

## 2022-05-29 NOTE — BH Assessment (Addendum)
Comprehensive Clinical Assessment (CCA) Screening, Triage and Referral Note  05/29/2022 Anna [name] [id number]  Chief Complaint: Homeless, Suicidal, Depression (Patient is Urgent)  Visit Diagnosis: Major Depressive Disorder, Recurrent, Severe w/o psychotic features and Substance Use Disorder  Patient Reported Information How did you hear about [place]? Self  What Is the Reason for Your Visit/Call Today? Anna [name] is a 46 yr old female that presents to [facility] with a complaint of "Not feeling good". She reports having weird thoughts right now. Clinician asked patient to describe her thoughts and patient stated "I feel like going away from here". Patient with current suicidal ideations. Onset reported as "a long time". Suicidal ideations triggered by current homelessness. She reports becoming homeless a few weeks ago , "again". Patient asked if she has a suicidal plan and responds, "Get hit by a car". No past suicide attempts. However, reports self injurious behaviors of cutting herself a few months ago. Unable to indentify protective factors. Current depressive symptoms include: hopelessness, worthlessness, isolating self from others, fatigue, tearful, guilt, and loss of intereset in usual pleasures. She reports over sleeping when asked how many hours of sleep she states, "Too many". Appetite is reported as "Good". No HI. Denies AVH's. She does not appear be responding to internal stimuli. She reports drug use (cocaine). Age of first use was 46 yrs old. Average amount of use is unknown and patient was unable to quantify usage. Last use was a few days ago. Also, reports alcohol use. Age of first use was 46 yrs old. Average amount of use is 2-3 coolers per week. Last use was yesterday and she reports drinking 1 wine cooler. Denies that she has a current therapist/psychiatrist. Hx of prior inpatient psychiatric admissions (2) and the last admisson was 2 yrs ago, "Somewhere in [place]". Clinicain asked patient  how does she feel our services could best help her today and she replies, "I don't know if that's a good question to ask me right now". Patient is noted as Urgent.  How Long Has This Been Causing You Problems? 1-6 months  What Do You Feel Would Help You the Most Today? Treatment for Depression or other mood problem; Stress Management; Medication(s)   Have You Recently Had Any Thoughts About Hurting Yourself? Yes  Are You Planning to Commit Suicide/Harm Yourself At This time? Yes   Have you Recently Had Thoughts About Hurting Someone [name]? No  Are You Planning to Harm Someone at This Time? No  Explanation: No data recorded  Have You Used Any Alcohol or Drugs in the Past 24 Hours? Yes  How Long Ago Did You Use Drugs or Alcohol? No data recorded What Did You Use and How Much? She reports drug use (cocaine). Age of first use was 46 yrs old. Average amount of use is unknown and patient was unable to quantify usage. Last use was a few days ago. Also, reports alcohol use. Age of first use was 46 yrs old. Average amount of use is 2-3 coolers per week. Last use was yesterday and she reports drinking 1 wine cooler.   Do You Currently Have a Therapist/Psychiatrist? No data recorded Name of Therapist/Psychiatrist: No data recorded  Have You Been Recently Discharged From Any Office Practice or Programs? No data recorded Explanation of Discharge From Practice/Program: No data recorded   CCA Screening Triage Referral Assessment Type of Contact: No data recorded Telemedicine Service Delivery:   Is this Initial or Reassessment? No data recorded Date Telepsych consult ordered in [facility]:  No  data recorded Time Telepsych consult ordered in [facility]:  No data recorded Location of Assessment: No data recorded Provider Location: No data recorded  Collateral Involvement: No data recorded  Does Patient Have a [place]? No data recorded Name and Contact of Legal Guardian: No data  recorded If Minor and Not Living with Parent(s), Who has Custody? No data recorded Is CPS involved or ever been involved? No data recorded Is APS involved or ever been involved? No data recorded  Patient Determined To Be At Risk for Harm To Self or Others Based on Review of Patient Reported Information or Presenting Complaint? No data recorded Method: No data recorded Availability of Means: No data recorded Intent: No data recorded Notification Required: No data recorded Additional Information for Danger to Others Potential: No data recorded Additional Comments for Danger to Others Potential: No data recorded Are There Guns or Other Weapons in Your Home? No data recorded Types of Guns/Weapons: No data recorded Are These Weapons Safely Secured?                            No data recorded Who Could Verify You Are Able To Have These Secured: No data recorded Do You Have any Outstanding Charges, Pending Court Dates, Parole/Probation? No data recorded Contacted To Inform of Risk of Harm To Self or Others: No data recorded  Does Patient Present under Involuntary Commitment? No data recorded IVC Papers Initial File Date: No data recorded  [place] of Residence: No data recorded  Patient Currently Receiving the Following Services: No data recorded  Determination of Need: Emergent (2 hours)   Options For Referral: Medication Management; Facility-Based Crisis (continous observation)   Discharge Disposition:     W[place]on Merl, Counselor

## 2022-05-29 NOTE — ED Provider Notes (Cosign Needed)
[facility]gent Care Continuous Assessment Admission H&P  Date: 05/29/22 Patient Name: Anna [name] MRN: [id number] Chief Complaint: No chief complaint on file.     Diagnoses:  Final diagnoses:  MDD (major depressive disorder), recurrent severe, without psychosis ([place])    HPI: Patient presents voluntarily to [facility] behavioral health for walk-in assessment.    Shaquera endorses suicidal ideation without specific plan currently.  She is unable to contract verbally for safety with this Probation officer.  Patient reports she is homeless for the last few weeks after the rapid rehousing program that assisted her with supplementing her apartment rent through the [employer] ended.  She reports she is homeless and sleeping on the streets understands the situation is dangerous and at this time her solution includes completing suicide.  She reports decreased sleep and appetite as well as feeling hopeless.  [name] has been diagnosed with major depressive disorder as well as cocaine use disorder.  She is not currently linked with outpatient psychiatry.  She endorses history of 1 previous inpatient psychiatric hospitalization approximately 2 years ago.  No current medications.  No family mental health history reported.  She endorses continued substance use for many years.  She reports she typically consumes alcohol approximately twice per week.  Typically an average of 2 drinks per episode, last alcohol use on yesterday.  She also endorses using cocaine rarely.  She denies any history of alcohol-related seizure, denies any history of delirium tremens.  Most recent cocaine use "a few days ago."  She denies substance use aside from alcohol and cocaine. Urine drug screen indicates positive for cocaine and marijuana.  She is not currently interested in residential substance use treatment.  Patient is assessed face-to-face by nurse practitioner.  She is seated in assessment area, no acute distress.  She is alert  and oriented, pleasant and cooperative during assessment.  She presents with depressed mood, tearful affect.  She denies homicidal ideations.  She endorses history of 1 previous suicide attempt at age 46. Denies non suicidal self-harm behavior.  She has normal speech and behavior.  She denies auditory and visual hallucinations.  Patient is able to converse coherently with goal-directed thoughts and no distractibility or preoccupation.  She denies paranoia.  Objectively there is no evidence of psychosis/mania or delusional thinking.  [name] is currently homeless in [place], recently sleeping outside.  She denies access to weapons.  She is not currently employed.    Patient offered support and encouragement.  She agrees with plan for inpatient psychiatric hospitalization.  PHQ 2-9:  [place] from 06/20/2021 in [facility] [employer] at [facility] Office Visit from 05/09/2021 in [facility] [employer] at [facility]  Thoughts that you would be better off dead, or of hurting yourself in some way Nearly every day More than half the days  PHQ-9 Total Score 26 [place] from 06/20/2021 in [facility] [place] at [facility] Admission (Discharged) from 05/22/2021 in [place] 58 from 05/18/2021 in [facility] PREADMISSION TESTING  C-SSRS RISK CATEGORY Moderate Risk No Risk Low Risk        Total Time spent with patient: 30 minutes  Musculoskeletal  Strength & Muscle Tone: within normal limits Gait & Station: normal Patient leans: N/A  Psychiatric Specialty Exam  Presentation General Appearance: Appropriate for Environment; Casual  Eye Contact:Good  Speech:Clear and Coherent; Normal Rate  Speech Volume:Normal  Handedness:No data recorded  Mood and Affect   Mood:Depressed  Affect:Depressed; Tearful   Thought Process  Thought Processes:Coherent; Goal Directed; Linear  Descriptions of Associations:Intact  Orientation:Full (Time, Place and Person)  Thought Content:Logical; WDL    Hallucinations:Hallucinations: None  Ideas of Reference:No data recorded Suicidal Thoughts:Suicidal Thoughts: Yes, Passive SI Passive Intent and/or Plan: Without Plan  Homicidal Thoughts:Homicidal Thoughts: No   Sensorium  Memory:Immediate Good; Recent Good  Judgment:Fair  Insight:Fair   Executive Functions  Concentration:Good  Attention Span:Good  Recall:Good  Fund of Knowledge:Good  Language:Good   Psychomotor Activity  Psychomotor Activity:Psychomotor Activity: Normal   Assets  Assets:Communication Skills; Desire for Improvement; Leisure Time; Physical Health; Resilience   Sleep  Sleep:Sleep: Poor   Nutritional Assessment (For OBS and FBC admissions only) Has the patient had a weight loss or gain of 10 pounds or more in the last 3 months?: No Has the patient had a decrease in food intake/or appetite?: Yes Does the patient have dental problems?: No Does the patient have eating habits or behaviors that may be indicators of an eating disorder including binging or inducing vomiting?: No Has the patient recently lost weight without trying?: 2.0 Has the patient been eating poorly because of a decreased appetite?: 1 Malnutrition Screening Tool Score: 3 Nutritional Assessment Referrals: Medication/Tx changes    Physical Exam Vitals and nursing note reviewed.  Constitutional:      Appearance: Normal appearance. She is well-developed and normal weight.  HENT:     Head: Normocephalic and atraumatic.     Nose: Nose normal.  Cardiovascular:     Rate and Rhythm: Normal rate.  Pulmonary:     Effort: Pulmonary effort is normal.  Musculoskeletal:        General: Normal range of motion.     Cervical back: Normal range of motion.   Skin:    General: Skin is warm and dry.  Neurological:     Mental Status: She is alert and oriented to person, place, and time.  Psychiatric:        Attention and Perception: Attention and perception normal.        Mood and Affect: Mood is depressed. Affect is tearful.        Speech: Speech normal.        Behavior: Behavior normal. Behavior is cooperative.        Thought Content: Thought content includes suicidal ideation.        Cognition and Memory: Cognition and memory normal.   Review of Systems  Constitutional: Negative.   HENT: Negative.    Eyes: Negative.   Respiratory: Negative.    Cardiovascular: Negative.   Gastrointestinal: Negative.   Genitourinary: Negative.   Musculoskeletal: Negative.   Skin: Negative.   Neurological: Negative.   Endo/Heme/Allergies: Negative.   Psychiatric/Behavioral:  Positive for depression, substance abuse and suicidal ideas.     Blood pressure (!) 145/102, pulse 81, temperature 98.1 F (36.7 C), temperature source Oral, resp. rate 19, SpO2 100 %. There is no height or weight on file to calculate BMI.  Past Psychiatric History: MDD, cocaine use disorder  Is the patient at risk to self? Yes  Has the patient been a risk to self in the past 6 months? No .    Has the patient been a risk to self within the distant past? Yes   Is the patient a risk to others? No   Has the patient been  a risk to others in the past 6 months? No   Has the patient been a risk to others within the distant past? No   Past Medical History:  Past Medical History:  Diagnosis Date  . Anemia   . Hypertension    only elevated at MD office per pt. no meds    Past Surgical History:  Procedure Laterality Date  . FRACTURE SURGERY  right ankle  . HYSTERECTOMY ABDOMINAL WITH SALPINGECTOMY Bilateral 05/22/2021   Procedure: HYSTERECTOMY ABDOMINAL WITH SALPINGECTOMY AND LEFT OOPHORECTOMY;  Surgeon: [name], MD;  Location: [facility];  Service: Gynecology;  Laterality:  Bilateral;  TAP BLOCK    Family History:  Family History  Problem Relation Age of Onset  . Healthy Mother   . Healthy Father     Social History:  Social History   Socioeconomic History  . Marital status: Legally Separated    Spouse name: Not on file  . Number of children: Not on file  . Years of education: Not on file  . Highest education level: Not on file  Occupational History  . Not on file  Tobacco Use  . Smoking status: Some Days    Types: Cigarettes  . Smokeless tobacco: Never  . Tobacco comments:    pt states quit 1-2 years ago   Vaping Use  . Vaping Use: Never used  Substance and Sexual Activity  . Alcohol use: Yes    Comment: occassionally, not often  . Drug use: Yes    Types: Cocaine    Comment: Last used a few weeks ago  . Sexual activity: Not Currently    Birth control/protection: None  Other Topics Concern  . Not on file  Social History Narrative   ** Merged History Encounter **       Social Determinants of Health   Financial Resource Strain: Not on file  Food Insecurity: Food Insecurity Present (05/09/2021)   Hunger Vital Sign   . Worried About Charity fundraiser in the Last Year: Sometimes true   . Ran Out of Food in the Last Year: Sometimes true  Transportation Needs: Unmet Transportation Needs (05/09/2021)   PRAPARE - Transportation   . Lack of Transportation (Medical): Yes   . Lack of Transportation (Non-Medical): Yes  Physical Activity: Not on file  Stress: Not on file  Social Connections: Not on file  Intimate Partner Violence: Not on file    SDOH:  SDOH Screenings   Alcohol Screen: Not on file  Depression (PHQ2-9): Medium Risk (06/20/2021)   Depression (PHQ2-9)   . PHQ-2 Score: 26  Financial Resource Strain: Not on file  Food Insecurity: Food Insecurity Present (05/09/2021)   Hunger Vital Sign   . Worried About Charity fundraiser in the Last Year: Sometimes true   . Ran Out of Food in the Last Year: Sometimes true  Housing: Not  on file  Physical Activity: Not on file  Social Connections: Not on file  Stress: Not on file  Tobacco Use: High Risk (06/20/2021)   Patient History   . Smoking Tobacco Use: Some Days   . Smokeless Tobacco Use: Never   . Passive Exposure: Not on file  Transportation Needs: Unmet Transportation Needs (05/09/2021)   [place] - Transportation   . Lack of Transportation (Medical): Yes   . Lack of Transportation (Non-Medical): Yes    Last Labs:  No visits with results within 6 Month(s) from this visit.  Latest known visit with results is:  Office  Visit on 06/20/2021  Component Date Value Ref Range Status  . WBC 06/20/2021 6.1  3.4 - 10.8 x10E3/uL Final  . RBC 06/20/2021 4.61  3.77 - 5.28 x10E6/uL Final  . Hemoglobin 06/20/2021 10.6 (L)  11.1 - 15.9 g/dL Final  . Hematocrit 06/20/2021 33.9 (L)  34.0 - 46.6 % Final  . MCV 06/20/2021 74 (L)  79 - 97 fL Final  . MCH 06/20/2021 23.0 (L)  26.6 - 33.0 pg Final  . MCHC 06/20/2021 31.3 (L)  31.5 - 35.7 g/dL Final  . RDW 06/20/2021 21.7 (H)  11.7 - 15.4 % Final  . Platelets 06/20/2021 411  150 - 450 x10E3/uL Final    Allergies: Latex and Peanut-containing drug products  PTA Medications: (Not in a hospital admission)   Medical Decision Making  Patient reviewed with Dr Hampton Abbot.  Patient will be admitted to [facility] behavioral health observation unit while awaiting inpatient psychiatric treatment, she remains voluntary at this time.  Laboratory studies ordered including CBC, CMP, ethanol, A1c, hepatic function, lipid panel, magnesium and TSH.  Urine pregnancy and urine drug screen ordered.  EKG order initiated.  Current medications: -Acetaminophen 650 mg every 6 as needed/mild pain -Maalox 30 mL oral every 4 as needed/digestion -Hydroxyzine 25 mg 3 times daily as needed/anxiety -Magnesium hydroxide 30 mL daily as needed/mild constipation -Trazodone 50 mg nightly as needed/sleep  CIWA Ativan protocol initiated: -Loperamide 2 to  4 mg oral as needed/diarrhea or loose stools -Lorazepam 1 mg every 6 hours as needed CIWA greater than 10 -Multivitamin with minerals 1 tablet daily -Ondansetron disintegrating tablet 4 mg every 6 as needed/nausea or vomiting -Thiamine injection 100 mg IM once -Thiamine tablet 100 mg daily    Recommendations  Based on my evaluation the patient does not appear to have an emergency medical condition.  Lucky Rathke, FNP 05/29/22  1:23 PM

## 2022-05-29 NOTE — ED Notes (Signed)
Pt sleeping@this time. Breathing even and unlabored. Will continue to monitor for safety 

## 2022-05-30 ENCOUNTER — Inpatient Hospital Stay (HOSPITAL_COMMUNITY)
Admission: AD | Admit: 2022-05-30 | Discharge: 2022-06-07 | DRG: 885 | Disposition: A | Discharge status: Home / Self Care | Payer: PRIVATE HEALTH INSURANCE | Source: Intra-hospital | Attending: Psychiatry | Admitting: Psychiatry

## 2022-05-30 ENCOUNTER — Encounter (HOSPITAL_COMMUNITY): Payer: Self-pay | Admitting: Family

## 2022-05-30 ENCOUNTER — Other Ambulatory Visit: Payer: Self-pay

## 2022-05-30 DIAGNOSIS — E7800 Pure hypercholesterolemia, unspecified: Secondary | ICD-10-CM | POA: Diagnosis present

## 2022-05-30 DIAGNOSIS — F419 Anxiety disorder, unspecified: Secondary | ICD-10-CM | POA: Diagnosis present

## 2022-05-30 DIAGNOSIS — Z90721 Acquired absence of ovaries, unilateral: Secondary | ICD-10-CM | POA: Diagnosis not present

## 2022-05-30 DIAGNOSIS — G4700 Insomnia, unspecified: Secondary | ICD-10-CM | POA: Diagnosis present

## 2022-05-30 DIAGNOSIS — F1490 Cocaine use, unspecified, uncomplicated: Secondary | ICD-10-CM | POA: Diagnosis present

## 2022-05-30 DIAGNOSIS — F332 Major depressive disorder, recurrent severe without psychotic features: Principal | ICD-10-CM | POA: Diagnosis present

## 2022-05-30 DIAGNOSIS — Z560 Unemployment, unspecified: Secondary | ICD-10-CM | POA: Diagnosis not present

## 2022-05-30 DIAGNOSIS — F1090 Alcohol use, unspecified, uncomplicated: Secondary | ICD-10-CM | POA: Diagnosis present

## 2022-05-30 DIAGNOSIS — Z91040 Latex allergy status: Secondary | ICD-10-CM | POA: Diagnosis not present

## 2022-05-30 DIAGNOSIS — E0590 Thyrotoxicosis, unspecified without thyrotoxic crisis or storm: Secondary | ICD-10-CM | POA: Diagnosis present

## 2022-05-30 DIAGNOSIS — Z91010 Allergy to peanuts: Secondary | ICD-10-CM | POA: Diagnosis not present

## 2022-05-30 DIAGNOSIS — R45851 Suicidal ideations: Secondary | ICD-10-CM | POA: Diagnosis present

## 2022-05-30 DIAGNOSIS — Z5900 Homelessness unspecified: Secondary | ICD-10-CM | POA: Diagnosis not present

## 2022-05-30 DIAGNOSIS — Z90710 Acquired absence of both cervix and uterus: Secondary | ICD-10-CM

## 2022-05-30 LAB — PROLACTIN: Prolactin: 5.6 ng/mL (ref 4.8–23.3)

## 2022-05-30 MED ORDER — TRAZODONE HCL 50 MG PO TABS
50.0000 mg | ORAL_TABLET | Freq: Every evening | ORAL | Status: DC | PRN
  Administered 2022-05-30 – 2022-06-01 (×3): 50 mg via ORAL
  Filled 2022-05-30 (×3): qty 1

## 2022-05-30 MED ORDER — HYDROXYZINE HCL 25 MG PO TABS
25.0000 mg | ORAL_TABLET | Freq: Three times a day (TID) | ORAL | Status: DC | PRN
  Administered 2022-05-30 – 2022-05-31 (×2): 25 mg via ORAL
  Filled 2022-05-30 (×3): qty 1

## 2022-05-30 MED ORDER — MAGNESIUM HYDROXIDE 400 MG/5ML PO SUSP
30.0000 mL | Freq: Every day | ORAL | Status: DC | PRN
Start: 2022-05-30 — End: 2022-06-07

## 2022-05-30 MED ORDER — ACETAMINOPHEN 325 MG PO TABS
650.0000 mg | ORAL_TABLET | Freq: Four times a day (QID) | ORAL | Status: DC | PRN
  Administered 2022-05-30 – 2022-06-01 (×2): 650 mg via ORAL
  Filled 2022-05-30 (×2): qty 2

## 2022-05-30 MED ORDER — ALUM & MAG HYDROXIDE-SIMETH 200-200-20 MG/5ML PO SUSP: 30.0000 mL | ORAL | Status: DC | PRN

## 2022-05-30 MED ORDER — WHITE PETROLATUM EX OINT
TOPICAL_OINTMENT | CUTANEOUS | Status: AC
  Administered 2022-05-30: 1
  Filled 2022-05-30: qty 5

## 2022-05-30 NOTE — ED Notes (Signed)
Pt sleeping at present,  No distress noted.  Monitoring for safety.

## 2022-05-30 NOTE — ED Notes (Signed)
Patient discharge via ambulatory with a steady gait with Safe Transport staff member. Respirations equal and unlabored, skin warm and dry. No acute distress noted.  

## 2022-05-30 NOTE — Progress Notes (Signed)
Patient ID: Anna [name], female   DOB: 17-Jan-1976, 46 y.o.   MRN: [id number]   Pt alert and oriented x 4 during [facility] admission. Pt denies HI, A/VH, and any pain. Pt endorses passive SI, but contracts for safety. Pt is sad and depressed, but calm and cooperative. Education, support, reassurance, and encouragement provided, q15 minute safety checks initiated. Pt's belongings in locker # 30.  Pt denies any concerns at this time and ambulating on the unit with no issues. Pt remains safe on the unit.

## 2022-05-30 NOTE — Progress Notes (Signed)
Psychoeducational Group Note  Date:  05/30/2022 Time:  2020  Group Topic/Focus:  Wrap up group  Participation Level: Did Not Attend  Participation Quality:  Not Applicable  Affect:  Not Applicable  Cognitive:  Not Applicable  Insight:  Not Applicable  Engagement in Group: Not Applicable  Additional Comments:  Did not attend.   Winfield Rast S 05/30/2022, 9:08 PM

## 2022-05-30 NOTE — Progress Notes (Signed)
   05/30/22 2157  Psych Admission Type (Psych Patients Only)  Admission Status Voluntary  Psychosocial Assessment  Patient Complaints Anxiety;Depression  Eye Contact Fair  Facial Expression Worried  Affect Depressed  Speech Logical/coherent  Interaction Assertive  Motor Activity Slow  Appearance/Hygiene In hospital gown  Behavior Characteristics Cooperative  Mood Depressed;Anxious  Thought Process  Coherency WDL  Content WDL  Delusions None reported or observed  Perception WDL  Hallucination None reported or observed  Judgment Impaired  Confusion None  Danger to Self  Current suicidal ideation? Passive  Self-Injurious Behavior No self-injurious ideation or behavior indicators observed or expressed   Agreement Not to Harm Self Yes  Description of Agreement verbal contract  Danger to Others  Danger to Others None reported or observed   D: Patient c/o of anxiety and headache.  A: Medications administered as prescribed. Support and encouragement provided as needed.  R: Patient remains safe on the unit. Will continue to monitor for safety and stability.

## 2022-05-30 NOTE — ED Notes (Signed)
Patient resting quietly in bed with eyes closed, Respirations equal and unlabored, skin warm and dry, NAD. Routine safety checks conducted according to facility protocol. Will continue to monitor for safety. 

## 2022-05-30 NOTE — ED Provider Notes (Signed)
Behavioral Health Progress Note  Date and Time: 05/30/2022 11:07 AM Name: Anna [name] MRN:  [id number]  Subjective: Patient states "my mind started racing, I started thinking weird stuff, I asked for medicine for anxiety."  Anna [name] endorses depressed and anxious mood, she presents with tearful affect.  She is reassessed, face-to-face, by nurse practitioner.  She is reclined in observation area upon my approach, no apparent distress.  She is alert and oriented, pleasant and cooperative during assessment.  She continues to endorse passive suicidal ideation, no plan verbalized at this time.  She is unable to contract for safety with this Probation officer.  She denies homicidal ideations.  She reports auditory and visual hallucinations sometimes.  Reports earlier today she believes she was having a dream, reports in dream she "stepped out in front of a train."  It appears that she visualized stepping in front of a train.  There is no evidence of delusional thought content currently and no indication that patient is responding to internal stimuli.  Patient endorses average appetite and sleep while at [facility]  Patient offered support and encouragement.  She remains voluntary and continues to agree with plan for inpatient psychiatric admission.  Diagnosis:  Final diagnoses:  MDD (major depressive disorder), recurrent severe, without psychosis ([place])    Total Time spent with patient: 20 minutes  Past Psychiatric History: MDD, cocaine use disorder   Past Medical History:  Past Medical History:  Diagnosis Date   Anemia    Hypertension    only elevated at MD office per pt. no meds    Past Surgical History:  Procedure Laterality Date   FRACTURE SURGERY  right ankle   HYSTERECTOMY ABDOMINAL WITH SALPINGECTOMY Bilateral 05/22/2021   Procedure: HYSTERECTOMY ABDOMINAL WITH SALPINGECTOMY AND LEFT OOPHORECTOMY;  Surgeon: [name], MD;  Location: [facility];  Service:  Gynecology;  Laterality: Bilateral;  TAP BLOCK   Family History:  Family History  Problem Relation Age of Onset   Healthy Mother    Healthy Father    Family Psychiatric  History: none reported Social History:  Social History   Substance and Sexual Activity  Alcohol Use Yes   Comment: occassionally, not often     Social History   Substance and Sexual Activity  Drug Use Yes   Types: Cocaine   Comment: Last used a few weeks ago    Social History   Socioeconomic History   Marital status: Legally Separated    Spouse name: Not on file   Number of children: Not on file   Years of education: Not on file   Highest education level: Not on file  Occupational History   Not on file  Tobacco Use   Smoking status: Some Days    Types: Cigarettes   Smokeless tobacco: Never   Tobacco comments:    pt states quit 1-2 years ago   Vaping Use   Vaping Use: Never used  Substance and Sexual Activity   Alcohol use: Yes    Comment: occassionally, not often   Drug use: Yes    Types: Cocaine    Comment: Last used a few weeks ago   Sexual activity: Not Currently    Birth control/protection: None  Other Topics Concern   Not on file  Social History Narrative   ** Merged History Encounter **       Social Determinants of Health   Financial Resource Strain: Not on file  Food Insecurity: Food Insecurity Present (05/09/2021)   Hunger Vital  Sign    Worried About Charity fundraiser in the Last Year: Sometimes true    Ran Out of Food in the Last Year: Sometimes true  Transportation [name]: [employer] [name] (05/09/2021)   PRAPARE - Hydrologist (Medical): Yes    Lack of Transportation (Non-Medical): Yes  Physical Activity: Not on file  Stress: Not on file  Social Connections: Not on file   SDOH:  SDOH Screenings   Alcohol Screen: Not on file  Depression (PHQ2-9): Medium Risk (06/20/2021)   Depression (PHQ2-9)    PHQ-2 Score: 26  Financial Resource  Strain: Not on file  Food Insecurity: Food Insecurity Present (05/09/2021)   Hunger Vital Sign    Worried About Running Out of Food in the Last Year: Sometimes true    Ran Out of Food in the Last Year: Sometimes true  Housing: Not on file  Physical Activity: Not on file  Social Connections: Not on file  Stress: Not on file  Tobacco Use: High Risk (05/29/2022)   Patient History    Smoking Tobacco Use: Some Days    Smokeless Tobacco Use: Never    Passive Exposure: Not on file  Transportation [name]: [employer] [name] (05/09/2021)   PRAPARE - Hydrologist (Medical): Yes    Lack of Transportation (Non-Medical): Yes   Additional Social History:                         Sleep: Fair  Appetite:  Good  Current Medications:  Current Facility-Administered Medications  Medication Dose Route Frequency Provider Last Rate Last Admin   acetaminophen (TYLENOL) tablet 650 mg  650 mg Oral Q6H PRN Lucky Rathke, FNP       alum & mag hydroxide-simeth (MAALOX/MYLANTA) 200-200-20 MG/5ML suspension 30 mL  30 mL Oral Q4H PRN Lucky Rathke, FNP       hydrOXYzine (ATARAX) tablet 25 mg  25 mg Oral TID PRN Lucky Rathke, FNP   25 mg at 05/30/22 0955   magnesium hydroxide (MILK OF MAGNESIA) suspension 30 mL  30 mL Oral Daily PRN Lucky Rathke, FNP       traZODone (DESYREL) tablet 50 mg  50 mg Oral QHS PRN Lucky Rathke, FNP   50 mg at 05/29/22 2135   No current outpatient medications on file.    Labs  Lab Results:  Admission on 05/29/2022  Component Date Value Ref Range Status   SARS Coronavirus 2 by RT PCR 05/29/2022 NEGATIVE  NEGATIVE Final   Comment: (NOTE) SARS-CoV-2 target nucleic acids are NOT DETECTED.  The SARS-CoV-2 RNA is generally detectable in upper respiratory specimens during the acute phase of infection. The lowest concentration of SARS-CoV-2 viral copies this assay can detect is 138 copies/mL. A negative result does not preclude  SARS-Cov-2 infection and should not be used as the sole basis for treatment or other patient management decisions. A negative result may occur with  improper specimen collection/handling, submission of specimen other than nasopharyngeal swab, presence of viral mutation(s) within the areas targeted by this assay, and inadequate number of viral copies(<138 copies/mL). A negative result must be combined with clinical observations, patient history, and epidemiological information. The expected result is Negative.  Fact Sheet for Patients:  EntrepreneurPulse.com.au  Fact Sheet for Healthcare Providers:  IncredibleEmployment.be  This test is no  t yet approved or cleared by the [place] and  has been authorized for detection and/or diagnosis of SARS-CoV-2 by FDA under an Emergency Use Authorization (EUA). This EUA will remain  in effect (meaning this test can be used) for the duration of the COVID-19 declaration under Section 564(b)(1) of the Act, 21 U.S.C.section 360bbb-3(b)(1), unless the authorization is terminated  or revoked sooner.       Influenza A by PCR 05/29/2022 NEGATIVE  NEGATIVE Final   Influenza B by PCR 05/29/2022 NEGATIVE  NEGATIVE Final   Comment: (NOTE) The Xpert Xpress SARS-CoV-2/FLU/RSV plus assay is intended as an aid in the diagnosis of influenza from Nasopharyngeal swab specimens and should not be used as a sole basis for treatment. Nasal washings and aspirates are unacceptable for Xpert Xpress SARS-CoV-2/FLU/RSV testing.  Fact Sheet for Patients: EntrepreneurPulse.com.au  Fact Sheet for Healthcare Providers: IncredibleEmployment.be  This test is not yet approved or cleared by the [place] FDA and has been authorized for detection and/or diagnosis of SARS-CoV-2 by FDA under an Emergency Use Authorization (EUA). This EUA will remain in effect  (meaning this test can be used) for the duration of the COVID-19 declaration under Section 564(b)(1) of the Act, 21 U.S.C. section 360bbb-3(b)(1), unless the authorization is terminated or revoked.  Performed at [facility], [place] [address], [place], [place] [address]    WBC 05/29/2022 6.6  4.0 - 10.5 K/uL Final   RBC 05/29/2022 4.71  3.87 - 5.11 MIL/uL Final   Hemoglobin 05/29/2022 13.1  12.0 - 15.0 g/dL Final   HCT 05/29/2022 40.8  36.0 - 46.0 % Final   MCV 05/29/2022 86.6  80.0 - 100.0 fL Final   MCH 05/29/2022 27.8  26.0 - 34.0 pg Final   MCHC 05/29/2022 32.1  30.0 - 36.0 g/dL Final   RDW 05/29/2022 14.6  11.5 - 15.5 % Final   Platelets 05/29/2022 382  150 - 400 K/uL Final   nRBC 05/29/2022 0.0  0.0 - 0.2 % Final   Neutrophils Relative % 05/29/2022 67  % Final   Neutro Abs 05/29/2022 4.4  1.7 - 7.7 K/uL Final   Lymphocytes Relative 05/29/2022 22  % Final   Lymphs Abs 05/29/2022 1.5  0.7 - 4.0 K/uL Final   Monocytes Relative 05/29/2022 7  % Final   Monocytes Absolute 05/29/2022 0.4  0.1 - 1.0 K/uL Final   Eosinophils Relative 05/29/2022 3  % Final   Eosinophils Absolute 05/29/2022 0.2  0.0 - 0.5 K/uL Final   Basophils Relative 05/29/2022 1  % Final   Basophils Absolute 05/29/2022 0.0  0.0 - 0.1 K/uL Final   Immature Granulocytes 05/29/2022 0  % Final   Abs Immature Granulocytes 05/29/2022 0.02  0.00 - 0.07 K/uL Final   Performed at [facility], [place] [address] [place], [place] [address]   Sodium 05/29/2022 140  135 - 145 mmol/L Final   Potassium 05/29/2022 4.1  3.5 - 5.1 mmol/L Final   Chloride 05/29/2022 102  98 - 111 mmol/L Final   CO2 05/29/2022 28  22 - 32 mmol/L Final   Glucose, Bld 05/29/2022 84  70 - 99 mg/dL Final   Glucose reference range applies only to samples taken after fasting for at least 8 hours.   BUN 05/29/2022 12  6 - 20 mg/dL Final   Creatinine, Ser 05/29/2022 0.85  0.44 - 1.00 mg/dL Final   Calcium 05/29/2022 10.1  8.9 - 10.3 mg/dL Final    Total  Protein 05/29/2022 7.5  6.5 - 8.1 g/dL Final   Albumin 05/29/2022 4.3  3.5 - 5.0 g/dL Final   AST 05/29/2022 21  15 - 41 U/L Final   ALT 05/29/2022 13  0 - 44 U/L Final   Alkaline Phosphatase 05/29/2022 71  38 - 126 U/L Final   Total Bilirubin 05/29/2022 0.4  0.3 - 1.2 mg/dL Final   GFR, Estimated 05/29/2022 >60  >60 mL/min Final   Comment: (NOTE) Calculated using the CKD-EPI Creatinine Equation (2021)    Anion gap 05/29/2022 10  5 - 15 Final   Performed at [facility], [place] [address] [place], [place] [address]   Hgb A1c MFr Bld 05/29/2022 5.1  4.8 - 5.6 % Final   Comment: REPEATED TO VERIFY (NOTE) Pre diabetes:          5.7%-6.4%  Diabetes:              >6.4%  Glycemic control for   <7.0% adults with diabetes    Mean Plasma Glucose 05/29/2022 99.67  mg/dL Final   Performed at [facility], [place] [address], [place], [place] [address]   Magnesium 05/29/2022 2.1  1.7 - 2.4 mg/dL Final   Performed at [place] [address] [place], [place] [address]   Alcohol, Ethyl (B) 05/29/2022 <10  <10 mg/dL Final   Comment: (NOTE) Lowest detectable limit for serum alcohol is 10 mg/dL.  For medical purposes only. Performed at [facility], [place] [address], [place], [place] [address]    Cholesterol 05/29/2022 202 (H)  0 - 200 mg/dL Final   Triglycerides 05/29/2022 78  <150 mg/dL Final   HDL 05/29/2022 103  >40 mg/dL Final   Total CHOL/HDL Ratio 05/29/2022 2.0  RATIO Final   VLDL 05/29/2022 16  0 - 40 mg/dL Final   LDL Cholesterol 05/29/2022 83  0 - 99 mg/dL Final   Comment:        Total Cholesterol/HDL:CHD Risk Coronary Heart Disease Risk Table                     Men   Women  1/2 Average Risk   3.4   3.3  Average Risk       5.0   4.4  2 X Average Risk   9.6   7.1  3 X Average Risk  23.4   11.0        Use the calculated Patient Ratio above and the CHD Risk Table to determine the patient's CHD Risk.        ATP III CLASSIFICATION (LDL):  <100      mg/dL   Optimal  100-129  mg/dL   Near or Above                    Optimal  130-159  mg/dL   Borderline  160-189  mg/dL   High  >190     mg/dL   Very High Performed at [facility] [address] [place], [place] [address]    TSH 05/29/2022 0.243 (L)  0.350 - 4.500 uIU/mL Final   Comment: Performed by a 3rd Generation assay with a functional sensitivity of <=0.01 uIU/mL. Performed at [facility], [place] [address], [place], [place] [address]    Prolactin 05/29/2022 5.6  4.8 - 23.3 ng/mL Final   Comment: (NOTE) Performed At: [facility] [address] [place], [place] [id number] Perlie Gold  Derinda Late MD OZ:[phone]    POC Amphetamine UR 05/29/2022 None Detected  NONE DETECTED (Cut Off Level 1000 ng/mL) Final   POC Secobarbital (BAR) 05/29/2022 None Detected  NONE DETECTED (Cut Off Level 300 ng/mL) Final   POC Buprenorphine (BUP) 05/29/2022 None Detected  NONE DETECTED (Cut Off Level 10 ng/mL) Final   POC Oxazepam (BZO) 05/29/2022 None Detected  NONE DETECTED (Cut Off Level 300 ng/mL) Final   POC Cocaine UR 05/29/2022 Positive (A)  NONE DETECTED (Cut Off Level 300 ng/mL) Final   POC Methamphetamine UR 05/29/2022 None Detected  NONE DETECTED (Cut Off Level 1000 ng/mL) Final   POC Morphine 05/29/2022 None Detected  NONE DETECTED (Cut Off Level 300 ng/mL) Final   POC Methadone UR 05/29/2022 None Detected  NONE DETECTED (Cut Off Level 300 ng/mL) Final   POC Oxycodone UR 05/29/2022 None Detected  NONE DETECTED (Cut Off Level 100 ng/mL) Final   POC Marijuana UR 05/29/2022 Positive (A)  NONE DETECTED (Cut Off Level 50 ng/mL) Final   SARSCOV2ONAVIRUS 2 AG 05/29/2022 NEGATIVE  NEGATIVE Final   Comment: (NOTE) SARS-CoV-2 antigen NOT DETECTED.   Negative results are presumptive.  Negative results do not preclude SARS-CoV-2 infection and should not be used as the sole basis for treatment or other patient management decisions, including infection  control decisions, particularly  in the presence of clinical signs and  symptoms consistent with COVID-19, or in those who have been in contact with the virus.  Negative results must be combined with clinical observations, patient history, and epidemiological information. The expected result is Negative.  Fact Sheet for Patients: HandmadeRecipes.com.cy  Fact Sheet for Healthcare Providers: FuneralLife.at  This test is not yet approved or cleared by the [place] FDA and  has been authorized for detection and/or diagnosis of SARS-CoV-2 by FDA under an Emergency Use Authorization (EUA).  This EUA will remain in effect (meaning this test can be used) for the duration of  the COV                          ID-19 declaration under Section 564(b)(1) of the Act, 21 U.S.C. section 360bbb-3(b)(1), unless the authorization is terminated or revoked sooner.      Blood Alcohol level:  Lab Results  Component Value Date   ETH <10 05/29/2022   ETH  06/24/2009    6        LOWEST DETECTABLE LIMIT FOR SERUM ALCOHOL IS 5 mg/dL FOR MEDICAL PURPOSES ONLY    Metabolic Disorder Labs: Lab Results  Component Value Date   HGBA1C 5.1 05/29/2022   MPG 99.67 05/29/2022   Lab Results  Component Value Date   PROLACTIN 5.6 05/29/2022   Lab Results  Component Value Date   CHOL 202 (H) 05/29/2022   TRIG 78 05/29/2022   HDL 103 05/29/2022   CHOLHDL 2.0 05/29/2022   VLDL 16 05/29/2022   LDLCALC 83 05/29/2022    Therapeutic Lab Levels: No results found for: "LITHIUM" No results found for: "VALPROATE" No results found for: "CBMZ"  Physical Findings   GAD-7    [place] from 06/20/2021 in [place] for [place] at [facility] Office Visit from 05/09/2021 in [place] for [place] at [facility]  Total GAD-7 Score 21 13      PHQ2-9    [place] from  06/20/2021 in [facility] for [place] at [facility] for  Women Office Visit from 05/09/2021 in Center for [employer] at [facility]  PHQ-2 Total Score 6 6  PHQ-9 Total Score 26 [place] from 06/20/2021 in [facility] for [place] at [facility] Admission (Discharged) from 05/22/2021 in [place] [facility] 60 from 05/18/2021 in [facility] PREADMISSION TESTING  C-SSRS RISK CATEGORY Moderate Risk No Risk Low Risk        Musculoskeletal  Strength & Muscle Tone: within normal limits Gait & Station: normal Patient leans: N/A  Psychiatric Specialty Exam  Presentation  General Appearance: Appropriate for Environment; Casual  Eye Contact:Fair  Speech:Clear and Coherent; Normal Rate  Speech Volume:Normal  Handedness:Right   Mood and Affect  Mood:Depressed  Affect:Depressed; Tearful   Thought Process  Thought Processes:Coherent; Goal Directed  Descriptions of Associations:Intact  Orientation:Full (Time, Place and Person)  Thought Content:Logical     Hallucinations:Hallucinations: None  Ideas of Reference:None  Suicidal Thoughts:Suicidal Thoughts: Yes, Passive SI Passive Intent and/or Plan: Without Plan  Homicidal Thoughts:Homicidal Thoughts: No   Sensorium  Memory:Immediate Good; Recent Good  Judgment:Fair  Insight:Fair   Executive Functions  Concentration:Good  Attention Span:Good  Recall:Good  Fund of Knowledge:Good  Language:Good   Psychomotor Activity  Psychomotor Activity:Psychomotor Activity: Normal   Assets  Assets:Communication Skills; Desire for Improvement; Leisure Time; Resilience; Physical Health   Sleep  Sleep:Sleep: Fair   Nutritional Assessment (For OBS and FBC admissions only) Has the patient had a weight loss or gain of 10 pounds or more in the last 3  months?: No Has the patient had a decrease in food intake/or appetite?: Yes Does the patient have dental problems?: No Does the patient have eating habits or behaviors that may be indicators of an eating disorder including binging or inducing vomiting?: No Has the patient recently lost weight without trying?: 2.0 Has the patient been eating poorly because of a decreased appetite?: 1 Malnutrition Screening Tool Score: 3 Nutritional Assessment Referrals: Medication/Tx changes    Physical Exam  Physical Exam Vitals and nursing note reviewed.  Constitutional:      Appearance: Normal appearance. She is well-developed.  HENT:     Head: Normocephalic and atraumatic.     Nose: Nose normal.  Cardiovascular:     Rate and Rhythm: Normal rate.  Pulmonary:     Effort: Pulmonary effort is normal.  Musculoskeletal:        General: Normal range of motion.     Cervical back: Normal range of motion.  Neurological:     Mental Status: She is alert and oriented to person, place, and time.    Review of Systems  Constitutional: Negative.   HENT: Negative.    Eyes: Negative.   Respiratory: Negative.    Cardiovascular: Negative.   Gastrointestinal: Negative.   Genitourinary: Negative.   Musculoskeletal: Negative.   Skin: Negative.   Neurological: Negative.   Endo/Heme/Allergies: Negative.   Psychiatric/Behavioral:  Positive for depression and suicidal ideas. The patient is nervous/anxious.    Blood pressure (!) 136/95, pulse 76, temperature 98.4 F (36.9 C), temperature source Oral, resp. rate 16, SpO2 100 %. There is no height or weight on file to calculate BMI.  Treatment Plan Summary: Patient reviewed with Dr. Serafina Mitchell. Patient remains voluntary, recommend for inpatient psychiatric admission/treatment. Daily contact with patient to assess and evaluate symptoms and progress in treatment  Lucky Rathke, FNP 05/30/2022 11:07 AM

## 2022-05-30 NOTE — ED Notes (Signed)
Pt A&O x 4, presents with suicidal ideations. Denies HI or AVH.  Pt calm & cooperative, monitoring for safety.

## 2022-05-30 NOTE — Discharge Instructions (Addendum)
Residential if Needed:   [facility]: [place], [place]  [place], [place] [address] [phone] (Take private insurance but call to verify if they take your direct insurance and can help with transportation)  [place], [place] (Inpatient 28 day program- have support groups, offer halfway houses resources once released.  You must be 72 hours sober upon admittance)  [facility]: [place], [place]  Outpatient Therapy/medication management  [facility] [place], [place] [address] [phone]  [facility], [place] [address] [phone] (Substance abuse groups and outpatient therapy)  [facility] [address] [place], [place] [address] [phone]  [facility] [address] [place], [place] [address] [phone]  [facility] [address] [place], [place] [address] [phone]  [facility] [address] [place], [place] [address] [phone] (Offer inpatient and outpatient)  [facility] [address] [place], [place] [address] [phone]

## 2022-05-30 NOTE — Progress Notes (Signed)
[facility] entered resources for detox, residential treatment, IOP for substance use, and medication management resources.  Anna [name] is to utilize these resources upon being discharged.

## 2022-05-30 NOTE — Progress Notes (Signed)
[facility]/BMU LCSW Progress Note   05/30/2022    2:06 PM  Anna [name]   [id number]   Type of Contact and Topic:  Psychiatric Bed Placement   Pt accepted to [facility] 302-1     Patient meets inpatient criteria per Beatriz Stallion, NP    The attending provider will be Dr. Nelda Marseille   Call report to [phone]    Anda Latina, RN @ [facility] notified.     Pt scheduled  to arrive at [facility] at 1330.    Mariea Clonts, MSW, LCSW-A  2:07 PM 05/30/2022

## 2022-05-30 NOTE — ED Notes (Signed)
Pt was given a muffin for breakfast

## 2022-05-30 NOTE — Progress Notes (Signed)
Patient ID: Anna [name], female   DOB: [birth year], 46 y.o.   MRN: [id number]   Initial Treatment Plan 05/30/2022 5:47 PM [name] YQI:[id number]    PATIENT STRESSORS: Financial difficulties   Loss of housing   Occupational concerns   Substance abuse     PATIENT STRENGTHS: Ability for insight  Capable of independent living  Motivation for treatment/growth    PATIENT IDENTIFIED PROBLEMS: Suicidal Ideation  Substance Abuse  Depression  Self-Harm Thoughts               DISCHARGE CRITERIA:  Ability to meet basic life and health needs Adequate post-discharge living arrangements Improved stabilization in mood, thinking, and/or behavior Reduction of life-threatening or endangering symptoms to within safe limits Verbal commitment to aftercare and medication compliance  PRELIMINARY DISCHARGE PLAN: Attend 12-step recovery group Outpatient therapy Return to previous living arrangement Referrals indicated:  housing  PATIENT/FAMILY INVOLVEMENT: This treatment plan has been presented to and reviewed with the patient, Anna [name], and/or family member.  The patient and family have been given the opportunity to ask questions and make suggestions.  Harriet Masson, RN 05/30/2022, 5:47 PM

## 2022-05-30 NOTE — ED Notes (Signed)
[facility] contacted for pick up.

## 2022-05-31 ENCOUNTER — Encounter (HOSPITAL_COMMUNITY): Payer: Self-pay | Admitting: Family

## 2022-05-31 ENCOUNTER — Encounter (HOSPITAL_COMMUNITY): Payer: Self-pay

## 2022-05-31 DIAGNOSIS — F332 Major depressive disorder, recurrent severe without psychotic features: Principal | ICD-10-CM

## 2022-05-31 LAB — T4, FREE: Free T4: 0.73 ng/dL (ref 0.61–1.12)

## 2022-05-31 MED ORDER — THIAMINE HCL 100 MG PO TABS
100.0000 mg | ORAL_TABLET | Freq: Every day | ORAL | Status: DC
  Administered 2022-06-01 – 2022-06-07 (×7): 100 mg via ORAL
  Filled 2022-05-31 (×8): qty 1

## 2022-05-31 MED ORDER — HYDROXYZINE HCL 25 MG PO TABS
25.0000 mg | ORAL_TABLET | Freq: Four times a day (QID) | ORAL | Status: AC | PRN
  Administered 2022-05-31 – 2022-06-02 (×4): 25 mg via ORAL
  Filled 2022-05-31 (×4): qty 1

## 2022-05-31 MED ORDER — LORAZEPAM 1 MG PO TABS: 1.0000 mg | ORAL_TABLET | Freq: Four times a day (QID) | ORAL | Status: AC | PRN

## 2022-05-31 MED ORDER — ONDANSETRON 4 MG PO TBDP
4.0000 mg | ORAL_TABLET | Freq: Four times a day (QID) | ORAL | Status: AC | PRN
Start: 2022-05-31 — End: 2022-06-03

## 2022-05-31 MED ORDER — LOPERAMIDE HCL 2 MG PO CAPS: 2.0000 mg | ORAL_CAPSULE | ORAL | Status: AC | PRN

## 2022-05-31 MED ORDER — WHITE PETROLATUM EX OINT
TOPICAL_OINTMENT | CUTANEOUS | Status: AC
  Administered 2022-05-31: 1
  Filled 2022-05-31: qty 5

## 2022-05-31 MED ORDER — ADULT MULTIVITAMIN W/MINERALS CH
1.0000 | ORAL_TABLET | Freq: Every day | ORAL | Status: DC
Start: 2022-05-31 — End: 2022-06-07
  Administered 2022-05-31 – 2022-06-07 (×8): 1 via ORAL
  Filled 2022-05-31 (×10): qty 1

## 2022-05-31 MED ORDER — SERTRALINE HCL 25 MG PO TABS
25.0000 mg | ORAL_TABLET | Freq: Every day | ORAL | Status: DC
Start: 2022-05-31 — End: 2022-06-02
  Administered 2022-05-31 – 2022-06-02 (×3): 25 mg via ORAL
  Filled 2022-05-31 (×5): qty 1

## 2022-05-31 NOTE — [facility] Suicide Risk Assessment (Signed)
[facility] INPATIENT:  Family/Significant Other Suicide Prevention Education  Suicide Prevention Education:  Patient Refusal for Family/Significant Other Suicide Prevention Education: The patient Anna [name] has refused to provide written consent for family/significant other to be provided Family/Significant Other Suicide Prevention Education during admission and/or prior to discharge.  Physician notified.  CSW completed SPE with patient. Discussed potential triggers leading to suicidal ideation in addition to coping skills one might use in order to delay and distract self from self harming behaviors. CSW encouraged patient to utilize emergency services if they felt unable to maintain their safety. SPE flyer provided to patient at this time.   Corky Crafts 05/31/2022, 2:48 PM

## 2022-05-31 NOTE — BHH Suicide Risk Assessment (Addendum)
Suicide Risk Assessment  Admission Assessment    [facility] Admission Suicide Risk Assessment   Nursing information obtained from:  Patient Demographic factors:  Low socioeconomic status Current Mental Status:  Suicidal ideation indicated by patient, Self-harm thoughts Loss Factors:  Decrease in vocational status, Financial problems / change in socioeconomic status Historical Factors:  Prior suicide attempts, Impulsivity Risk Reduction Factors:  NA  Total Time spent with patient: 15 minutes Principal Problem: MDD (major depressive disorder), recurrent severe, without psychosis (HCC) Diagnosis:  Principal Problem:   MDD (major depressive disorder), recurrent severe, without psychosis (HCC)  Subjective Data: as noted from H&P: Anna [name] is a 46 year old African-American female who presents with worsening depression and anxiety.  She reports decreased mood, poor concentration and passive thoughts of death.  States she has recently been evicted due to the housing program she was enrolled and has ended. "  I do not know what I am going to do now."  Reports using cocaine and alcohol to mask her symptoms.  Denied that she is currently followed by therapy or psychiatry.  She reports previous inpatient admissions however states she has not followed up with any outpatient providers.  She reports a history of self injures behaviors.   Anna [name] presents flat, guarded but pleasant.  Does report auditory hallucinations however states " it's my own voice."  Denied voices are command in nature.  Reports previous diagnosis of major depressive disorder.  Anna [name] reports strained relationship between she and her children states she has 4 children.  She denied that she is currently employed.  Currently denying cravings for alcohol or cocaine at this time.  Reports fair appetite.  States she is resting well throughout the night.  Continue inpatient admission 3 to 5 days.   Continued Clinical Symptoms:    The  "Alcohol Use Disorders Identification Test", Guidelines for Use in Primary Care, Second Edition.  World Science writer [facility]). Score between 0-7:  no or low risk or alcohol related problems. Score between 8-15:  moderate risk of alcohol related problems. Score between 16-19:  high risk of alcohol related problems. Score 20 or above:  warrants further diagnostic evaluation for alcohol dependence and treatment.   CLINICAL FACTORS:   Depression:   Comorbid alcohol abuse/dependence Insomnia Alcohol/Substance Abuse/Dependencies   Musculoskeletal: Strength & Muscle Tone: within normal limits Gait & Station: normal Patient leans: N/A  Psychiatric Specialty Exam:  Presentation  General Appearance: Appropriate for Environment; Disheveled  Eye Contact:Poor  Speech:Clear and Coherent  Speech Volume:Decreased  Handedness:Right   Mood and Affect  Mood:Depressed; Anxious  Affect:Depressed; Flat   Thought Process  Thought Processes:Coherent  Descriptions of Associations:Intact  Orientation:Full (Time, Place and Person)  Thought Content:Logical  History of Schizophrenia/Schizoaffective disorder:No data recorded Duration of Psychotic Symptoms:No data recorded Hallucinations:Hallucinations: None  Ideas of Reference:None  Suicidal Thoughts:Suicidal Thoughts: Yes, Passive SI Passive Intent and/or Plan: Without Plan; Without Intent  Homicidal Thoughts:Homicidal Thoughts: No   Sensorium  Memory:Immediate Good; Recent Good; Remote Good  Judgment:Fair  Insight:Fair   Executive Functions  Concentration:Good  Attention Span:Good  Recall:Fair  Fund of Knowledge:Fair  Language:Fair   Psychomotor Activity  Psychomotor Activity:Psychomotor Activity: Normal   Assets  Assets:Communication Skills; Desire for Improvement   Sleep  Sleep:Sleep: Fair    Physical Exam: Physical Exam ROS Blood pressure 115/89, pulse 81, temperature 97.6 F (36.4 C),  temperature source Oral, resp. rate 16, height 5\' 7"  (1.702 m), weight 77.1 kg, SpO2 100 %. Body mass index is 26.63 kg/m.  COGNITIVE FEATURES THAT CONTRIBUTE TO RISK:  Closed-mindedness    SUICIDE RISK:  Moderate - Suicidal ideation on admission, substance use prior to admission, previous suicide attempt  PLAN OF CARE:  Continue inpatient admission Initiated Zoloft 25 mg daily for mood stabilization Continue trazodone 50 mg p.o. nightly Patient encouraged to participate in therapeutic milieu Follow-up with social worker regarding additional resources for housing  I certify that inpatient services furnished can reasonably be expected to improve the patient's condition.   Oneta Rack, NP 05/31/2022, 2:02 PM

## 2022-06-02 LAB — T3, FREE: T3, Free: 2.7 pg/mL (ref 2.0–4.4)

## 2022-06-02 MED ORDER — TRAZODONE HCL 100 MG PO TABS
100.0000 mg | ORAL_TABLET | Freq: Once | ORAL | Status: DC | PRN
Start: 2022-06-02 — End: 2022-06-02

## 2022-06-02 MED ORDER — WHITE PETROLATUM EX OINT
TOPICAL_OINTMENT | CUTANEOUS | Status: AC
  Filled 2022-06-02: qty 10

## 2022-06-02 MED ORDER — TRAZODONE HCL 100 MG PO TABS
100.0000 mg | ORAL_TABLET | Freq: Every evening | ORAL | Status: DC | PRN
  Administered 2022-06-02 – 2022-06-03 (×3): 100 mg via ORAL
  Filled 2022-06-02 (×3): qty 1

## 2022-06-02 MED ORDER — TRAZODONE HCL 50 MG PO TABS: 50.0000 mg | ORAL_TABLET | Freq: Once | ORAL | Status: DC | PRN

## 2022-06-02 MED ORDER — SERTRALINE HCL 50 MG PO TABS
50.0000 mg | ORAL_TABLET | Freq: Every day | ORAL | Status: DC
Start: 2022-06-03 — End: 2022-06-05
  Administered 2022-06-03 – 2022-06-05 (×3): 50 mg via ORAL
  Filled 2022-06-02 (×5): qty 1

## 2022-06-03 MED ORDER — WHITE PETROLATUM EX OINT
TOPICAL_OINTMENT | CUTANEOUS | Status: AC
  Filled 2022-06-03: qty 10

## 2022-06-03 NOTE — Progress Notes (Signed)
D:  Patient denied SI and HI, contracts for safety.  Denied A/V hallucinations.   A:  Medications administered per MD orders.  Emotional support and encouragement given patient. R:  Safety maintained with 15 minute checks.  

## 2022-06-03 NOTE — Plan of Care (Signed)
Nurse discussed coping skills with patient.  

## 2022-06-04 MED ORDER — WHITE PETROLATUM EX OINT
TOPICAL_OINTMENT | CUTANEOUS | Status: AC
  Administered 2022-06-04: 1
  Filled 2022-06-04: qty 10

## 2022-06-04 MED ORDER — TRAZODONE HCL 150 MG PO TABS
150.0000 mg | ORAL_TABLET | Freq: Every evening | ORAL | Status: DC | PRN
  Administered 2022-06-04 – 2022-06-06 (×3): 150 mg via ORAL
  Filled 2022-06-04 (×3): qty 1

## 2022-06-04 NOTE — Plan of Care (Signed)
Nurse discussed coping skills with patient.  

## 2022-06-04 NOTE — BHH Group Notes (Signed)
Adult Psychoeducational Group Note  Date:  06/04/2022 Time:  8:38 PM  Group Topic/Focus:  Wrap-Up Group:   The focus of this group is to help patients review their daily goal of treatment and discuss progress on daily workbooks.  Participation Level:  Minimal  Participation Quality:  Inattentive  Affect:  Appropriate  Cognitive:  Alert and Appropriate  Insight: Lacking  Engagement in Group:  Engaged  Modes of Intervention:  Discussion and Education  Additional Comments:  Pt attended and participated in group and rated their day a 9/10, due to them talking with their peers. Pt completed their goal, which was to stay positive and to laugh more.   [name] 06/04/2022, 8:38 PM

## 2022-06-05 ENCOUNTER — Encounter (HOSPITAL_COMMUNITY): Payer: Self-pay

## 2022-06-05 MED ORDER — WHITE PETROLATUM EX OINT
TOPICAL_OINTMENT | CUTANEOUS | Status: AC
  Filled 2022-06-05: qty 5

## 2022-06-05 MED ORDER — SERTRALINE HCL 25 MG PO TABS
75.0000 mg | ORAL_TABLET | Freq: Every day | ORAL | Status: DC
  Administered 2022-06-06 – 2022-06-07 (×2): 75 mg via ORAL
  Filled 2022-06-05 (×3): qty 3

## 2022-06-05 MED ORDER — SERTRALINE HCL 25 MG PO TABS
25.0000 mg | ORAL_TABLET | Freq: Once | ORAL | Status: AC
  Administered 2022-06-05: 25 mg via ORAL
  Filled 2022-06-05 (×2): qty 1

## 2022-06-05 NOTE — Group Note (Signed)
LCSW Group Therapy Note  Group Date: 06/05/2022 Start Time: 1300 End Time: 1400   Type of Therapy and Topic:  Group Therapy: Anger Cues and Responses  Participation Level:  Did Not Attend   Description of Group:   In this group, patients learned how to recognize the physical, cognitive, emotional, and behavioral responses they have to anger-provoking situations.  They identified a recent time they became angry and how they reacted.  They analyzed how their reaction was possibly beneficial and how it was possibly unhelpful.  The group discussed a variety of healthier coping skills that could help with such a situation in the future.  Focus was placed on how helpful it is to recognize the underlying emotions to our anger, because working on those can lead to a more permanent solution as well as our ability to focus on the important rather than the urgent.  Therapeutic Goals: Patients will remember their last incident of anger and how they felt emotionally and physically, what their thoughts were at the time, and how they behaved. Patients will identify how their behavior at that time worked for them, as well as how it worked against them. Patients will explore possible new behaviors to use in future anger situations. Patients will learn that anger itself is normal and cannot be eliminated, and that healthier reactions can assist with resolving conflict rather than worsening situations.  Summary of Patient Progress:   Patient did not attend group despite encouraged participation.    Therapeutic Modalities:   Cognitive Behavioral Therapy    Larose Kells 06/05/2022  2:25 PM

## 2022-06-05 NOTE — B[facility] Group Notes (Signed)
[place] Group Notes:  (Nursing/MHT/Case Management/Adjunct)  Date:  06/05/2022  Time:  11:33 AM  Type of Therapy:  Psychoeducational Skills  Participation Level:  Did Not Attend  Summary of Progress/Problems:  Patient did not attend psycho-educational group.   Elza Rafter 06/05/2022, 11:33 AM

## 2022-06-05 NOTE — Progress Notes (Addendum)
[facility] MD Progress Note  06/05/2022 4:29 PM Anna [name]  MRN:  [id number] Subjective:   Anna [name] is a 46 yr old female who presented on 6/21 to the [facility] for worsening depression and SI without a plan, she was admitted to [facility] on 6/23.  PPHx is significant for MDD, Cocaine Use, and EtOH use, and 1 prior hospitalization (~2021).   Case was discussed in the multidisciplinary team. MAR was reviewed and patient was compliant with medications.  She required PRN Trazodone yesterday.   Psychiatric Team made the following recommendations yesterday: -Continue Zoloft 50 mg daily  -Increase Trazodone to 150 mg QHS PRN   On interview today patient reports she slept good last night she reports that the increase in her trazodone was successful.  She reports her appetite is doing good.  She reports no SI, HI, or AVH.  She reports no [place], Ideas of Reference, or other First Rank symptoms.  She reports no issues with her medications.  She reports that her frustration and what led to her admission was missing the appointment with the housing supervisor.  She reports that being kicked out of her apartment was a major setback for her.  She plans to meet with the housing supervisor after discharge to continue working on resolving this.  Discussed what would happen if she was unable to get housing and she states that she is a very strong person and so will manage through it.  Asked her if she had provided the name and number to the friend who she was going to stay with to social work.  She states that this person's phone has been cut off and so she has been unable to get in touch with him but that in the past he had said she could stay with him.  Discussed we would need to safety plan with someone and who would an alternative person be and she stated she will think of this and discussed with social work later this afternoon.  She reports no other concerns at present.    Principal Problem: MDD (major depressive  disorder), recurrent severe, without psychosis ([place]) Diagnosis: Principal Problem:   MDD (major depressive disorder), recurrent severe, without psychosis ([place])  Total Time spent with patient:  I personally spent 30 minutes on the unit in direct patient care. The direct patient care time included face-to-face time with the patient, reviewing the patient's chart, communicating with other professionals, and coordinating care. Greater than 50% of this time was spent in counseling or coordinating care with the patient regarding goals of hospitalization, psycho-education, and discharge planning needs.   Past Psychiatric History: MDD, Cocaine Use, and EtOH use, and 1 prior hospitalization (~2021).  Past Medical History:  Past Medical History:  Diagnosis Date   Anemia    Hypertension    only elevated at MD office per pt. no meds    Past Surgical History:  Procedure Laterality Date   FRACTURE SURGERY  right ankle   HYSTERECTOMY ABDOMINAL WITH SALPINGECTOMY Bilateral 05/22/2021   Procedure: HYSTERECTOMY ABDOMINAL WITH SALPINGECTOMY AND LEFT OOPHORECTOMY;  Surgeon: [name], MD;  Location: [facility];  Service: Gynecology;  Laterality: Bilateral;  TAP BLOCK   Family History:  Family History  Problem Relation Age of Onset   Healthy Mother    Healthy Father    Family Psychiatric  History: Reports None Social History:  Social History   Substance and Sexual Activity  Alcohol Use Yes   Comment: occassionally, not often  Social History   Substance and Sexual Activity  Drug Use Yes   Types: Cocaine, Marijuana   Comment: occasional cocaine use per availability; cannabis use 2x weekly    Social History   Socioeconomic History   Marital status: Legally Separated    Spouse name: Not on file   Number of children: Not on file   Years of education: Not on file   Highest education level: Not on file  Occupational History   Not on file  Tobacco Use   Smoking status: Some Days     Types: Cigarettes   Smokeless tobacco: Never   Tobacco comments:    pt states quit 1-2 years ago   Vaping Use   Vaping Use: Never used  Substance and Sexual Activity   Alcohol use: Yes    Comment: occassionally, not often   Drug use: Yes    Types: Cocaine, Marijuana    Comment: occasional cocaine use per availability; cannabis use 2x weekly   Sexual activity: Not Currently    Birth control/protection: None  Other Topics Concern   Not on file  Social History Narrative   ** Merged History Encounter **       Social Determinants of Health   Financial Resource Strain: Not on file  Food Insecurity: Food Insecurity Present (05/09/2021)   Hunger Vital Sign    Worried About Running Out of Food in the Last Year: Sometimes true    Ran Out of Food in the Last Year: Sometimes true  Transportation Needs: Unmet Transportation Needs (05/09/2021)   PRAPARE - Hydrologist (Medical): Yes    Lack of Transportation (Non-Medical): Yes  Physical Activity: Not on file  Stress: Not on file  Social Connections: Not on file   Additional Social History:                         Sleep: Good  Appetite:  Good  Current Medications: Current Facility-Administered Medications  Medication Dose Route Frequency Provider Last Rate Last Admin   acetaminophen (TYLENOL) tablet 650 mg  650 mg Oral Q6H PRN Lucky Rathke, FNP   650 mg at 06/01/22 0955   alum & mag hydroxide-simeth (MAALOX/MYLANTA) 200-200-20 MG/5ML suspension 30 mL  30 mL Oral Q4H PRN Lucky Rathke, FNP       magnesium hydroxide (MILK OF MAGNESIA) suspension 30 mL  30 mL Oral Daily PRN Lucky Rathke, FNP       multivitamin with minerals tablet 1 tablet  1 tablet Oral Daily Nelda Marseille, Amy E, MD   1 tablet at 06/05/22 0802   [START ON 06/06/2022] sertraline (ZOLOFT) tablet 75 mg  75 mg Oral Daily Briant Cedar, MD       thiamine tablet 100 mg  100 mg Oral Daily Nelda Marseille, Amy E, MD   100 mg at 06/05/22 0802    traZODone (DESYREL) tablet 150 mg  150 mg Oral QHS PRN Briant Cedar, MD   150 mg at 06/04/22 2136    Lab Results: No results found for this or any previous visit (from the past 48 hour(s)).  Blood Alcohol level:  Lab Results  Component Value Date   ETH <10 05/29/2022   ETH  06/24/2009    6        LOWEST DETECTABLE LIMIT FOR SERUM ALCOHOL IS 5 mg/dL FOR MEDICAL PURPOSES ONLY    Metabolic Disorder Labs: Lab Results  Component Value Date  HGBA1C 5.1 05/29/2022   MPG 99.67 05/29/2022   Lab Results  Component Value Date   PROLACTIN 5.6 05/29/2022   Lab Results  Component Value Date   CHOL 202 (H) 05/29/2022   TRIG 78 05/29/2022   HDL 103 05/29/2022   CHOLHDL 2.0 05/29/2022   VLDL 16 05/29/2022   LDLCALC 83 05/29/2022    Physical Findings: AIMS:  , ,  ,  ,    CIWA:  CIWA-Ar Total: 0 COWS:     Musculoskeletal: Strength & Muscle Tone: within normal limits Gait & Station: normal Patient leans: N/A  Psychiatric Specialty Exam:  Presentation  General Appearance: Appropriate for Environment; Casual  Eye Contact:Good  Speech:Clear and Coherent; Normal Rate  Speech Volume:Normal  Handedness:Right   Mood and Affect  Mood:-- ("ok")  Affect:Congruent   Thought Process  Thought Processes:Coherent; Goal Directed  Descriptions of Associations:Intact  Orientation:Full (Time, Place and Person)  Thought Content:Logical No SI, HI, or AVH. No Paranoia, Ideas of Reference, or First Rank symptoms.  History of Schizophrenia/Schizoaffective disorder:No data recorded Duration of Psychotic Symptoms:No data recorded Hallucinations:Hallucinations: None  Ideas of Reference:None  Suicidal Thoughts:Suicidal Thoughts: No  Homicidal Thoughts:Homicidal Thoughts: No   Sensorium  Memory:Immediate Fair; Recent Fair  Judgment:Fair  Insight:Fair   Executive Functions  Concentration:Good  Attention Span:Good  [place] of  Knowledge:Good  Language:Good   Psychomotor Activity  Psychomotor Activity:Psychomotor Activity: Normal   Assets  Assets:Communication Skills; Desire for Improvement; Resilience; Physical Health   Sleep  Sleep:Sleep: Good Number of Hours of Sleep: 0 (not charted)    Physical Exam: Physical Exam Vitals and nursing note reviewed.  Constitutional:      General: She is not in acute distress.    Appearance: Normal appearance. She is normal weight. She is not ill-appearing or toxic-appearing.  HENT:     Head: Normocephalic and atraumatic.  Pulmonary:     Effort: Pulmonary effort is normal.  Musculoskeletal:        General: Normal range of motion.  Neurological:     General: No focal deficit present.     Mental Status: She is alert.    Review of Systems  Respiratory:  Negative for cough and shortness of breath.   Cardiovascular:  Negative for chest pain.  Gastrointestinal:  Negative for abdominal pain, constipation, diarrhea, nausea and vomiting.  Neurological:  Negative for dizziness, weakness and headaches.  Psychiatric/Behavioral:  Negative for depression, hallucinations and suicidal ideas. The patient is not nervous/anxious.    Blood pressure (!) 134/100, pulse 89, temperature 98.2 F (36.8 C), temperature source Oral, resp. rate 18, height '5\' 7"'$  (1.702 m), weight 77.1 kg, SpO2 100 %. Body mass index is 26.63 kg/m.   Treatment Plan Summary: Daily contact with patient to assess and evaluate symptoms and progress in treatment and Medication management  Anna [name] is a 46 yr old female who presented on 6/21 to the [facility] for worsening depression and SI without a plan, she was admitted to [facility] on 6/23.  PPHx is significant for MDD, Cocaine Use, and EtOH use, and 1 prior hospitalization (~2021).   Anna [name] has tolerated the increase in Zoloft today.  She has improved with the medication and so will plan for discharge on Friday and will safety plan tomorrow.  We will  continue to monitor.   MDD (major depressive disorder), recurrent severe, without psychosis ([place]): -Increase Zoloft to 75 mg today   Insomnia: -Continue Trazodone 150 mg QHS PRN   Low TSH: -Free T3: 2.7,  Free T4: 0.73 -Follow up outpatient with PCP   -Continue PRN's: Tylenol, Maalox, Atarax, Milk of Magnesia   Labs On Admission- CMP: WNL,  CBC: WNL,  A1c: 5.1, Lipid Panel: WNL except Chol: 202,  TSH: 0.243,  EtOH: Neg,  Prolactin: 5.6,  Mag: 2.1,  UDS: Cocaine and THC Positive, Urine Preg: Neg, Resp Panel: Neg 6/23-  Free T3: 2.7,  Free T4: 0.73   Briant Cedar, MD 06/05/2022, 4:29 PM

## 2022-06-05 NOTE — Progress Notes (Signed)
   06/05/22 1000  Psych Admission Type (Psych Patients Only)  Admission Status Voluntary  Psychosocial Assessment  Patient Complaints Anxiety  Eye Contact Fair  Facial Expression Anxious  Affect Appropriate to circumstance  Speech Logical/coherent  Interaction Assertive  Motor Activity Other (Comment) (WDL)  Appearance/Hygiene Unremarkable  Behavior Characteristics Appropriate to situation  Mood Anxious  Thought Process  Coherency WDL  Content WDL  Delusions None reported or observed  Perception WDL  Hallucination None reported or observed  Judgment Impaired  Confusion None  Danger to Self  Current suicidal ideation? Denies  Self-Injurious Behavior No self-injurious ideation or behavior indicators observed or expressed   Agreement Not to Harm Self Yes  Description of Agreement Verbal  Danger to Others  Danger to Others None reported or observed

## 2022-06-05 NOTE — Progress Notes (Signed)

## 2022-06-05 NOTE — BH IP Treatment Plan (Signed)
Interdisciplinary Treatment and Diagnostic Plan Update  06/05/2022 Time of Session: 0830 Ayahna Solazzo MRN: [id number]  Principal Diagnosis: MDD (major depressive disorder), recurrent severe, without psychosis ([place])  Secondary Diagnoses: Principal Problem:   MDD (major depressive disorder), recurrent severe, without psychosis ([place])   Current Medications:  Current Facility-Administered Medications  Medication Dose Route Frequency Provider Last Rate Last Admin   acetaminophen (TYLENOL) tablet 650 mg  650 mg Oral Q6H PRN Lucky Rathke, FNP   650 mg at 06/01/22 0955   alum & mag hydroxide-simeth (MAALOX/MYLANTA) 200-200-20 MG/5ML suspension 30 mL  30 mL Oral Q4H PRN Lucky Rathke, FNP       magnesium hydroxide (MILK OF MAGNESIA) suspension 30 mL  30 mL Oral Daily PRN Lucky Rathke, FNP       multivitamin with minerals tablet 1 tablet  1 tablet Oral Daily Nelda Marseille, Amy E, MD   1 tablet at 06/05/22 0802   [START ON 06/06/2022] sertraline (ZOLOFT) tablet 75 mg  75 mg Oral Daily Briant Cedar, MD       thiamine tablet 100 mg  100 mg Oral Daily Nelda Marseille, Amy E, MD   100 mg at 06/05/22 0802   traZODone (DESYREL) tablet 150 mg  150 mg Oral QHS PRN Briant Cedar, MD   150 mg at 06/04/22 2136   PTA Medications: No medications prior to admission.    Patient Stressors: Financial difficulties   Loss of housing   Occupational concerns   Substance abuse    Patient Strengths: Ability for insight  Capable of independent living  Motivation for treatment/growth   Treatment Modalities: Medication Management, Group therapy, Case management,  1 to 1 session with clinician, Psychoeducation, Recreational therapy.   Physician Treatment Plan for Primary Diagnosis: MDD (major depressive disorder), recurrent severe, without psychosis ([place]) Long Term Goal(s): Improvement in symptoms so as ready for discharge   Short Term Goals: Ability to maintain clinical measurements within normal limits  will improve Compliance with prescribed medications will improve Ability to identify triggers associated with substance abuse/mental health issues will improve Ability to identify changes in lifestyle to reduce recurrence of condition will improve Ability to verbalize feelings will improve Ability to disclose and discuss suicidal ideas  Medication Management: Evaluate patient's response, side effects, and tolerance of medication regimen.  Therapeutic Interventions: 1 to 1 sessions, Unit Group sessions and Medication administration.  Evaluation of Outcomes: Progressing  Physician Treatment Plan for Secondary Diagnosis: Principal Problem:   MDD (major depressive disorder), recurrent severe, without psychosis ([place])  Long Term Goal(s): Improvement in symptoms so as ready for discharge   Short Term Goals: Ability to maintain clinical measurements within normal limits will improve Compliance with prescribed medications will improve Ability to identify triggers associated with substance abuse/mental health issues will improve Ability to identify changes in lifestyle to reduce recurrence of condition will improve Ability to verbalize feelings will improve Ability to disclose and discuss suicidal ideas     Medication Management: Evaluate patient's response, side effects, and tolerance of medication regimen.  Therapeutic Interventions: 1 to 1 sessions, Unit Group sessions and Medication administration.  Evaluation of Outcomes: Progressing   RN Treatment Plan for Primary Diagnosis: MDD (major depressive disorder), recurrent severe, without psychosis ([place]) Long Term Goal(s): Knowledge of disease and therapeutic regimen to maintain health will improve  Short Term Goals: Ability to remain free from injury will improve, Ability to verbalize frustration and anger appropriately will improve, Ability to demonstrate self-control, Ability to participate in decision  making will improve, Ability to  verbalize feelings will improve, Ability to disclose and discuss suicidal ideas, Ability to identify and develop effective coping behaviors will improve, and Compliance with prescribed medications will improve  Medication Management: RN will administer medications as ordered by provider, will assess and evaluate patient's response and provide education to patient for prescribed medication. RN will report any adverse and/or side effects to prescribing provider.  Therapeutic Interventions: 1 on 1 counseling sessions, Psychoeducation, Medication administration, Evaluate responses to treatment, Monitor vital signs and CBGs as ordered, Perform/monitor CIWA, COWS, AIMS and Fall Risk screenings as ordered, Perform wound care treatments as ordered.  Evaluation of Outcomes: Progressing   LCSW Treatment Plan for Primary Diagnosis: MDD (major depressive disorder), recurrent severe, without psychosis ([place]) Long Term Goal(s): Safe transition to appropriate next level of care at discharge, Engage patient in therapeutic group addressing interpersonal concerns.  Short Term Goals: Engage patient in aftercare planning with referrals and resources, Increase social support, Increase ability to appropriately verbalize feelings, Increase emotional regulation, Facilitate acceptance of mental health diagnosis and concerns, Facilitate patient progression through stages of change regarding substance use diagnoses and concerns, Identify triggers associated with mental health/substance abuse issues, and Increase skills for wellness and recovery  Therapeutic Interventions: Assess for all discharge needs, 1 to 1 time with Social worker, Explore available resources and support systems, Assess for adequacy in community support network, Educate family and significant other(s) on suicide prevention, Complete Psychosocial Assessment, Interpersonal group therapy.  Evaluation of Outcomes: Progressing   Progress in  Treatment: Attending groups: Yes. Participating in groups: Yes. Taking medication as prescribed: Yes. Toleration medication: Yes. Family/Significant other contact made: No, will contact:  Patient has declined to provide consent for CSW to reach family/friend.  Patient understands diagnosis: Yes. Discussing patient identified problems/goals with staff: Yes. Medical problems stabilized or resolved: Yes. Denies suicidal/homicidal ideation: Yes. Issues/concerns per patient self-inventory: Yes. Other: none  New problem(s) identified: No, Describe:  none  New Short Term/Long Term Goal(s): Patient to work towards detox,  medication management for mood stabilization; elimination of SI thoughts; development of comprehensive mental wellness/sobriety plan.   Patient Goals: No additional goals identified at this time. Patient to continue to work towards original goals identified in initial treatment team meeting. CSW will remain available to patient should they voice additional treatment goals.   Discharge Plan or Barriers: No psychosocial barriers identified at this time, patient to return to place of residence when appropriate for discharge.   Reason for Continuation of Hospitalization: Depression  Estimated Length of Stay: 1-7 days   Last 3 [place] Suicide Severity Risk Score: [place] Admission (Current) from 05/30/2022 in [place] from 06/20/2021 in [facility] for [facility] Admission (Discharged) from 05/22/2021 in [place] High Risk Moderate Risk No Risk       Last PHQ 2/9 Scores:    06/20/2021   10:06 AM 05/09/2021    8:55 AM  Depression screen PHQ 2/9  Decreased Interest 3 3  Down, Depressed, Hopeless 3 3  PHQ - 2 Score 6 6  Altered sleeping 3 3  Tired, decreased energy 3 1  Change in appetite 3 0  Feeling bad or  failure about yourself  3 3  Trouble concentrating 3 1  Moving slowly or fidgety/restless 2 0  Suicidal thoughts 3 2  PHQ-9 Score 26 16  Difficult doing  work/chores  Not difficult at all    Scribe for Treatment Team: Larose Kells 06/05/2022 11:17 AM

## 2022-06-05 NOTE — BHH Group Notes (Signed)
[facility] Group Notes:  (Nursing/MHT/Case Management/Adjunct)  Date:  06/05/2022  Time:  9:49 AM  Type of Therapy:  Group Therapy Orientation/ Goals    Participation Level:  Did Not Attend   Summary of Progress/Problems:   Patient did not attend orientation/ goals group today.   Elza Rafter 06/05/2022, 9:49 AM

## 2022-06-06 MED ORDER — WHITE PETROLATUM EX OINT
TOPICAL_OINTMENT | CUTANEOUS | Status: AC
  Administered 2022-06-06: 1
  Filled 2022-06-06: qty 10

## 2022-06-06 MED ORDER — WHITE PETROLATUM EX OINT
TOPICAL_OINTMENT | CUTANEOUS | Status: AC
  Filled 2022-06-06: qty 5

## 2022-06-06 NOTE — Progress Notes (Signed)
[facility] MD Progress Note  06/06/2022 1:30 PM Anna [name]  MRN:  [id number] Subjective:   Anna [name] is a 46 yr old female who presented on 6/21 to the [facility] for worsening depression and SI without a plan, she was admitted to [facility] on 6/23.  PPHx is significant for MDD, Cocaine Use, and EtOH use, and 1 prior hospitalization (~2021).   Case was discussed in the multidisciplinary team. MAR was reviewed and patient was compliant with medications.  She received PRN Trazodone yesterday.   Psychiatric Team made the following recommendations yesterday: -Increase Zoloft to 75 mg yesterday -Continue Trazodone 150 mg QHS PRN   On interview today patient reports she slept good last night.  She reports her appetite is doing good.  She reports no SI, HI, or AVH.  She reports no Paranoia, Ideas of Reference, or other First Rank symptoms.  She reports no issues with her medications.  She reports that her mood is improved.  She reports that she will get in touch with the house coordinator again to continue working on finding housing once she is discharged.  When asked what she would do if she is unable to secure housing and she reports she will stay positive and just keep working on it.  She reports no other concerns at present.     Principal Problem: MDD (major depressive disorder), recurrent severe, without psychosis ([place]) Diagnosis: Principal Problem:   MDD (major depressive disorder), recurrent severe, without psychosis ([place])  Total Time spent with patient:  I personally spent 30 minutes on the unit in direct patient care. The direct patient care time included face-to-face time with the patient, reviewing the patient's chart, communicating with other professionals, and coordinating care. Greater than 50% of this time was spent in counseling or coordinating care with the patient regarding goals of hospitalization, psycho-education, and discharge planning needs.   Past Psychiatric History: MDD, Cocaine  Use, and EtOH use, and 1 prior hospitalization (~2021).  Past Medical History:  Past Medical History:  Diagnosis Date   Anemia    Hypertension    only elevated at MD office per pt. no meds    Past Surgical History:  Procedure Laterality Date   FRACTURE SURGERY  right ankle   HYSTERECTOMY ABDOMINAL WITH SALPINGECTOMY Bilateral 05/22/2021   Procedure: HYSTERECTOMY ABDOMINAL WITH SALPINGECTOMY AND LEFT OOPHORECTOMY;  Surgeon: [name], MD;  Location: [facility];  Service: Gynecology;  Laterality: Bilateral;  TAP BLOCK   Family History:  Family History  Problem Relation Age of Onset   Healthy Mother    Healthy Father    Family Psychiatric  History: Reports None Social History:  Social History   Substance and Sexual Activity  Alcohol Use Yes   Comment: occassionally, not often     Social History   Substance and Sexual Activity  Drug Use Yes   Types: Cocaine, Marijuana   Comment: occasional cocaine use per availability; cannabis use 2x weekly    Social History   Socioeconomic History   Marital status: Legally Separated    Spouse name: Not on file   Number of children: Not on file   Years of education: Not on file   Highest education level: Not on file  Occupational History   Not on file  Tobacco Use   Smoking status: Some Days    Types: Cigarettes   Smokeless tobacco: Never   Tobacco comments:    pt states quit 1-2 years ago   Vaping Use   Vaping Use:  Never used  Substance and Sexual Activity   Alcohol use: Yes    Comment: occassionally, not often   Drug use: Yes    Types: Cocaine, Marijuana    Comment: occasional cocaine use per availability; cannabis use 2x weekly   Sexual activity: Not Currently    Birth control/protection: None  Other Topics Concern   Not on file  Social History Narrative   ** Merged History Encounter **       Social Determinants of Health   Financial Resource Strain: Not on file  Food Insecurity: Food Insecurity Present  (05/09/2021)   Hunger Vital Sign    Worried About Running Out of Food in the Last Year: Sometimes true    Ran Out of Food in the Last Year: Sometimes true  Transportation Needs: Unmet Transportation Needs (05/09/2021)   PRAPARE - Hydrologist (Medical): Yes    Lack of Transportation (Non-Medical): Yes  Physical Activity: Not on file  Stress: Not on file  Social Connections: Not on file   Additional Social History:                         Sleep: Fair  Appetite:  Good  Current Medications: Current Facility-Administered Medications  Medication Dose Route Frequency Provider Last Rate Last Admin   white petrolatum (VASELINE) gel            acetaminophen (TYLENOL) tablet 650 mg  650 mg Oral Q6H PRN Lucky Rathke, FNP   650 mg at 06/01/22 0955   alum & mag hydroxide-simeth (MAALOX/MYLANTA) 200-200-20 MG/5ML suspension 30 mL  30 mL Oral Q4H PRN Lucky Rathke, FNP       magnesium hydroxide (MILK OF MAGNESIA) suspension 30 mL  30 mL Oral Daily PRN Lucky Rathke, FNP       multivitamin with minerals tablet 1 tablet  1 tablet Oral Daily Harlow Asa, MD   1 tablet at 06/06/22 1610   sertraline (ZOLOFT) tablet 75 mg  75 mg Oral Daily Briant Cedar, MD   75 mg at 06/06/22 0820   thiamine tablet 100 mg  100 mg Oral Daily Nelda Marseille, Amy E, MD   100 mg at 06/06/22 9604   traZODone (DESYREL) tablet 150 mg  150 mg Oral QHS PRN Briant Cedar, MD   150 mg at 06/05/22 2152    Lab Results: No results found for this or any previous visit (from the past 29 hour(s)).  Blood Alcohol level:  Lab Results  Component Value Date   ETH <10 05/29/2022   ETH  06/24/2009    6        LOWEST DETECTABLE LIMIT FOR SERUM ALCOHOL IS 5 mg/dL FOR MEDICAL PURPOSES ONLY    Metabolic Disorder Labs: Lab Results  Component Value Date   HGBA1C 5.1 05/29/2022   MPG 99.67 05/29/2022   Lab Results  Component Value Date   PROLACTIN 5.6 05/29/2022   Lab Results   Component Value Date   CHOL 202 (H) 05/29/2022   TRIG 78 05/29/2022   HDL 103 05/29/2022   CHOLHDL 2.0 05/29/2022   VLDL 16 05/29/2022   LDLCALC 83 05/29/2022    Physical Findings: AIMS: Facial and Oral Movements Muscles of Facial Expression: None, normal Lips and Perioral Area: None, normal Jaw: None, normal Tongue: None, normal,Extremity Movements Upper (arms, wrists, hands, fingers): None, normal Lower (legs, knees, ankles, toes): None, normal, Trunk Movements Neck, shoulders, hips: None,  normal, Overall Severity Severity of abnormal movements (highest score from questions above): None, normal Incapacitation due to abnormal movements: None, normal Patient's awareness of abnormal movements (rate only patient's report): No Awareness, Dental Status Current problems with teeth and/or dentures?: No Does patient usually wear dentures?: No  CIWA:  CIWA-Ar Total: 0 COWS:     Musculoskeletal: Strength & Muscle Tone: within normal limits Gait & Station: normal Patient leans: N/A  Psychiatric Specialty Exam:  Presentation  General Appearance: Appropriate for Environment; Casual  Eye Contact:Good  Speech:Clear and Coherent; Normal Rate  Speech Volume:Normal  Handedness:Right   Mood and Affect  Mood:Euthymic  Affect:Congruent; Appropriate (bright)   Thought Process  Thought Processes:Coherent; Goal Directed  Descriptions of Associations:Intact  Orientation:Full (Time, Place and Person)  Thought Content:Logical No SI, HI, or AVH. No Paranoia, Ideas of Reference, or First Rank symptoms.  History of Schizophrenia/Schizoaffective disorder:No data recorded Duration of Psychotic Symptoms:No data recorded Hallucinations:Hallucinations: None  Ideas of Reference:None  Suicidal Thoughts:Suicidal Thoughts: No  Homicidal Thoughts:Homicidal Thoughts: No   Sensorium  Memory:Immediate Fair; Recent Fair  Judgment:Fair  Insight:Fair   Executive Functions   Concentration:Good  Attention Span:Good  [place] of Knowledge:Good  Language:Good   Psychomotor Activity  Psychomotor Activity:Psychomotor Activity: Normal   Assets  Assets:Communication Skills; Desire for Improvement; Physical Health; Resilience   Sleep  Sleep:Sleep: Fair Number of Hours of Sleep: 4.75    Physical Exam: Physical Exam Vitals and nursing note reviewed.  Constitutional:      General: She is not in acute distress.    Appearance: Normal appearance. She is normal weight. She is not ill-appearing or toxic-appearing.  HENT:     Head: Normocephalic and atraumatic.  Pulmonary:     Effort: Pulmonary effort is normal.  Musculoskeletal:        General: Normal range of motion.  Neurological:     General: No focal deficit present.     Mental Status: She is alert.    Review of Systems  Respiratory:  Negative for cough and shortness of breath.   Cardiovascular:  Negative for chest pain.  Gastrointestinal:  Negative for abdominal pain, constipation, diarrhea, nausea and vomiting.  Neurological:  Negative for dizziness, weakness and headaches.  Psychiatric/Behavioral:  Negative for depression, hallucinations and suicidal ideas. The patient is not nervous/anxious.    Blood pressure (!) 125/97, pulse 88, temperature 97.6 F (36.4 C), temperature source Oral, resp. rate 16, height '5\' 7"'$  (1.702 m), weight 77.1 kg, SpO2 100 %. Body mass index is 26.63 kg/m.   Treatment Plan Summary: Daily contact with patient to assess and evaluate symptoms and progress in treatment and Medication management  Johana Hopkinson is a 46 yr old female who presented on 6/21 to the [facility] for worsening depression and SI without a plan, she was admitted to [facility] on 6/23.  PPHx is significant for MDD, Cocaine Use, and EtOH use, and 1 prior hospitalization (~2021).   Isela has responded well to the Zoloft and had no issues with it.  We will plan for discharge tomorrow.  We will not  make any changes to her medications at this time.  We will continue to monitor.    MDD (major depressive disorder), recurrent severe, without psychosis ([place]): -Continue Zoloft 75 mg daily   Insomnia: -Continue Trazodone 150 mg PRN QHS   Low TSH: -Free T3: 2.7,  Free T4: 0.73 -Follow up outpatient with PCP   -Continue PRN's: Tylenol, Maalox, Atarax, Milk of Magnesia   Labs On Admission-  CMP: WNL,  CBC: WNL,  A1c: 5.1, Lipid Panel: WNL except Chol: 202,  TSH: 0.243,  EtOH: Neg,  Prolactin: 5.6,  Mag: 2.1,  UDS: Cocaine and THC Positive, Urine Preg: Neg, Resp Panel: Neg 6/23-  Free T3: 2.7,  Free T4: 0.73   Briant Cedar, MD 06/06/2022, 1:30 PM

## 2022-06-06 NOTE — BHH Group Notes (Signed)
Adult Psychoeducational Group Note  Date:  06/06/2022 Time:  8:28 PM  Group Topic/Focus:  Wrap-Up Group:   The focus of this group is to help patients review their daily goal of treatment and discuss progress on daily workbooks.  Participation Level:  Active  Participation Quality:  Attentive  Affect:  Appropriate  Cognitive:  Appropriate  Insight: Appropriate  Engagement in Group:  Engaged  Modes of Intervention:  Activity  Additional Comments:  Patient attended and participated in the [place] group.  Annie Sable 06/06/2022, 8:28 PM

## 2022-06-06 NOTE — Progress Notes (Signed)
   06/06/22 1000  Psych Admission Type (Psych Patients Only)  Admission Status Voluntary  Psychosocial Assessment  Patient Complaints None  Eye Contact Fair  Facial Expression Anxious  Affect Appropriate to circumstance  Speech Logical/coherent  Interaction Assertive  Motor Activity Other (Comment) (Unremarkable.)  Appearance/Hygiene Unremarkable  Behavior Characteristics Appropriate to situation  Mood Anxious;Pleasant  Thought Process  Coherency WDL  Content WDL  Delusions None reported or observed  Perception WDL  Hallucination None reported or observed  Judgment WDL  Confusion None  Danger to Self  Current suicidal ideation? Denies  Self-Injurious Behavior No self-injurious ideation or behavior indicators observed or expressed   Agreement Not to Harm Self Yes  Description of Agreement Verbal.  Danger to Others  Danger to Others None reported or observed

## 2022-06-06 NOTE — BHH Suicide Risk Assessment (Signed)
[place] INPATIENT:  Family/Significant Other Suicide Prevention Education  Suicide Prevention Education:  Education Completed; Netta Neat, mother has been identified by the patient as the family member/significant other with whom the patient will be residing, and identified as the person(s) who will aid the patient in the event of a mental health crisis (suicidal ideations/suicide attempt).  With written consent from the patient, the family member/significant other has been provided the following suicide prevention education, prior to the and/or following the discharge of the patient.  In addition to the precautions below, patient's support agrees to monitor patient for safety, ensure treatment compliance, and alert emergency services should patient require such services. States she has not further concerns about patient's safety. Confirmed no access to firearms or large quantities of medications.   The suicide prevention education provided includes the following: Suicide risk factors Suicide prevention and interventions National Suicide Hotline telephone number [facility] assessment telephone number [facility] Emergency Assistance [place] and/or Residential Mobile Crisis Unit telephone number  Request made of family/significant other to: Remove weapons (e.g., guns, rifles, knives), all items previously/currently identified as safety concern.   Remove drugs/medications (over-the-counter, prescriptions, illicit drugs), all items previously/currently identified as a safety concern.  The family member/significant other verbalizes understanding of the suicide prevention education information provided.  The family member/significant other agrees to remove the items of safety concern listed above.  Durenda Hurt 06/06/2022, 3:40 PM

## 2022-06-07 MED ORDER — WHITE PETROLATUM EX OINT
TOPICAL_OINTMENT | CUTANEOUS | Status: AC
  Filled 2022-06-07: qty 10

## 2022-06-07 MED ORDER — SERTRALINE HCL 25 MG PO TABS: 75.0000 mg | ORAL_TABLET | Freq: Every day | ORAL | 0 refills | Status: DC

## 2022-06-07 MED ORDER — TRAZODONE HCL 150 MG PO TABS
150.0000 mg | ORAL_TABLET | Freq: Every evening | ORAL | 0 refills | Status: DC | PRN
Start: 2022-06-07 — End: 2022-07-03

## 2022-06-07 NOTE — Progress Notes (Signed)
   06/07/22 0000  Psych Admission Type (Psych Patients Only)  Admission Status Voluntary  Psychosocial Assessment  Patient Complaints None  Eye Contact Fair  Facial Expression Animated  Affect Appropriate to circumstance  Speech Logical/coherent  Interaction Assertive  Motor Activity Slow  Appearance/Hygiene Unremarkable  Behavior Characteristics Appropriate to situation  Mood Pleasant  Thought Process  Coherency WDL  Content WDL  Delusions None reported or observed  Perception WDL  Hallucination None reported or observed  Judgment Poor  Confusion None  Danger to Self  Current suicidal ideation? Denies  Self-Injurious Behavior No self-injurious ideation or behavior indicators observed or expressed   Agreement Not to Harm Self Yes  Description of Agreement verbal  Danger to Others  Danger to Others None reported or observed

## 2022-06-07 NOTE — Progress Notes (Signed)
Patient ID: Anna [name], female   DOB: 1976-03-03, 46 y.o.   MRN: [id number]    Pt ambulatory, alert, and oriented X4 on and off the unit. Education, support, and encouragement provided. Discharge summary/AVS, prescriptions, medications, and follow up appointments reviewed with pt and a copy of AVS was given to pt. Suicide prevention resources provided. Pt's belongings in locker #35 returned and belongings sheet signed. Pt denies SI/HI, A/VH, pain, or any concerns at this time. Pt discharged to lobby to [facility] to be transported to her destination.

## 2022-06-07 NOTE — Progress Notes (Signed)
  [facility] Adult Case Management Discharge Plan :  Will you be returning to the same living situation after discharge:  No.Patient to discharge to mother's residence.   At discharge, do you have transportation home?: Yes,  Patient to discharge with cab voucher to mother's residence.  Do you have the ability to pay for your medications: No. Patient has no listed insurance, to be provided with 7 day supply of medication at discharge. CSW has provided patient with clinic information for free/reduced medications.   Release of information consent forms completed and in the chart;  Patient's signature needed at discharge.  Patient to Follow up at:  Follow-up Information     [place]. Go on 07/03/2022.   Why: Please present for scheduled appointment on 07/03/2022 at 2:30 pm. Contact information: [place] [address] [phone]        [facility]. Go to.   Specialty: Behavioral Health Why: Please go to this provider for therapy and medication management services on new patient assessment days:  Monday or Wednesday, Arrive at 7:30 am. Contact information: [place] [phone]                Next level of care provider has access to [place] and Suicide Prevention discussed: Yes,  SPE completed with patient and Netta Neat, mother.  Patient's support agrees to monitor patient for safety, ensure treatment compliance, and alert emergency services should patient require such services.    Has patient been referred to the Quitline?: Patient refused referral Tobacco Use: High Risk (05/31/2022)   Patient History    Smoking Tobacco Use: Some Days    Smokeless Tobacco Use: Never    Passive Exposure: Not on file    Patient has been referred for addiction treatment: Yes Patient referred to [facility] for mental health and substance use treatement, see appointment information above.  Social History   Substance and Sexual Activity  Drug Use Yes   Types: Cocaine, Marijuana   Comment: occasional cocaine use per availability; cannabis use 2x weekly   Social History   Substance and Sexual Activity  Alcohol Use Yes   Comment: occassionally, not often    Durenda Hurt, [place] 06/07/2022, 9:34 AM

## 2022-06-07 NOTE — Discharge Summary (Signed)
Physician Discharge Summary Note  Patient:  Anna [name] is an 46 y.o., female MRN:  [id number] DOB:  1976-11-27 Patient phone:  [phone] (home)  Patient address:   [place] [address],  Total Time spent with patient: 15 minutes  Date of Admission:  05/30/2022 Date of Discharge: 06/07/2022  Reason for Admission:    Anna [name] is a 27 year old African-American female who presents with worsening depression and anxiety.  She reports decreased mood, poor concentration and passive thoughts of death.  States she has recently been evicted due to the housing program she was enrolled and has ended. "  I do not know what I am going to do now."  Reports using cocaine and alcohol to mask her symptoms.  Denied that she is currently followed by therapy or psychiatry.  She reports previous inpatient admissions however states she has not followed up with any outpatient providers.  She reports a history of self injures behaviors.   Principal Problem: MDD (major depressive disorder), recurrent severe, without psychosis ([place]) Discharge Diagnoses: Principal Problem:   MDD (major depressive disorder), recurrent severe, without psychosis ([place])   Past Psychiatric History: MDD, Cocaine Use, and EtOH use, and 1 prior hospitalization (~2021).  Past Medical History:  Past Medical History:  Diagnosis Date   Anemia    Hypertension    only elevated at MD office per pt. no meds    Past Surgical History:  Procedure Laterality Date   FRACTURE SURGERY  right ankle   HYSTERECTOMY ABDOMINAL WITH SALPINGECTOMY Bilateral 05/22/2021   Procedure: HYSTERECTOMY ABDOMINAL WITH SALPINGECTOMY AND LEFT OOPHORECTOMY;  Surgeon: [name], MD;  Location: [facility];  Service: Gynecology;  Laterality: Bilateral;  TAP BLOCK   Family History:  Family History  Problem Relation Age of Onset   Healthy Mother    Healthy Father    Family Psychiatric  History: Reports None Social History:  Social History    Substance and Sexual Activity  Alcohol Use Yes   Comment: occassionally, not often     Social History   Substance and Sexual Activity  Drug Use Yes   Types: Cocaine, Marijuana   Comment: occasional cocaine use per availability; cannabis use 2x weekly    Social History   Socioeconomic History   Marital status: Legally Separated    Spouse name: Not on file   Number of children: Not on file   Years of education: Not on file   Highest education level: Not on file  Occupational History   Not on file  Tobacco Use   Smoking status: Some Days    Types: Cigarettes   Smokeless tobacco: Never   Tobacco comments:    pt states quit 1-2 years ago   Vaping Use   Vaping Use: Never used  Substance and Sexual Activity   Alcohol use: Yes    Comment: occassionally, not often   Drug use: Yes    Types: Cocaine, Marijuana    Comment: occasional cocaine use per availability; cannabis use 2x weekly   Sexual activity: Not Currently    Birth control/protection: None  Other Topics Concern   Not on file  Social History Narrative   ** Merged History Encounter **       Social Determinants of Health   Financial Resource Strain: Not on file  Food Insecurity: Food Insecurity Present (05/09/2021)   Hunger Vital Sign    Worried About Running Out of Food in the Last Year: Sometimes true    Ran Out of Food in  the Last Year: Sometimes true  Transportation Needs: Unmet Transportation Needs (05/09/2021)   PRAPARE - Hydrologist (Medical): Yes    Lack of Transportation (Non-Medical): Yes  Physical Activity: Not on file  Stress: Not on file  Social Connections: Not on file    Hospital Course:   During the patient's hospitalization, patient had extensive initial psychiatric evaluation, and follow-up psychiatric evaluations every day.  Psychiatric diagnoses provided upon initial assessment: MDD (major depressive disorder), recurrent severe, without psychosis  Patient's  psychiatric medications were adjusted on admission: She was started on Zoloft.  During the hospitalization, other adjustments were made to the patient's psychiatric medication regimen: Her Zoloft was Titrated during admission.  Her sleep was an issue so her Trazodone was titrated during his stay.  Patient's care was discussed during the interdisciplinary team meeting every day during the hospitalization.  The patient is not having side effects to prescribed psychiatric medication.  Gradually, patient started adjusting to milieu. The patient was evaluated each day by a clinical provider to ascertain response to treatment. Improvement was noted by the patient's report of decreasing symptoms, improved sleep and appetite, affect, medication tolerance, behavior, and participation in unit programming.  Patient was asked each day to complete a self inventory noting mood, mental status, pain, new symptoms, anxiety and concerns.   Symptoms were reported as significantly decreased or resolved completely by discharge.  The patient reports that their mood is stable.  The patient denied having suicidal thoughts for more than 48 hours prior to discharge.  Patient denies having homicidal thoughts.  Patient denies having auditory hallucinations.  Patient denies any visual hallucinations or other symptoms of psychosis.  The patient was motivated to continue taking medication with a goal of continued improvement in mental health.   The patient reports their target psychiatric symptoms of depression and SI responded well to the psychiatric medications, and the patient reports overall benefit other psychiatric hospitalization. Supportive psychotherapy was provided to the patient. The patient also participated in regular group therapy while hospitalized. Coping skills, problem solving as well as relaxation therapies were also part of the unit programming.  Labs were reviewed with the patient, and abnormal results were  discussed with the patient.  The patient is able to verbalize their individual safety plan to this provider.  # It is recommended to the patient to continue psychiatric medications as prescribed, after discharge from the hospital.    # It is recommended to the patient to follow up with your outpatient psychiatric provider and PCP.  # It was discussed with the patient, the impact of alcohol, drugs, tobacco have been there overall psychiatric and medical wellbeing, and total abstinence from substance use was recommended the patient.ed.  # Prescriptions provided or sent directly to preferred pharmacy at discharge. Patient agreeable to plan. Given opportunity to ask questions. Appears to feel comfortable with discharge.    # In the event of worsening symptoms, the patient is instructed to call the crisis hotline, 911 and or go to the nearest ED for appropriate evaluation and treatment of symptoms. To follow-up with primary care provider for other medical issues, concerns and or health care needs  # Patient was discharged to her mother's home with a plan to follow up as noted below.    On day of discharge she reports she is feeling great.  She states she is looking forward to going to her mother's house.  She reports that she slept well last night.  She reports her appetite is doing good.  She reports no SI, HI, or AVH.  She reports no [place], Ideas of Reference, or other First Rank symptoms.  She reports no issues with her medications.  She reports her #1 plan is still to recontact the housing coordinator so she can continue working on finding housing.  Discussed with her that while we hope she is successful she does need to prepare in case housing is not available immediately.  Discussed with her what to do in the event of a future crisis.  Discussed that she can return to [facility], go to the [facility], go to the nearest ED, or call 911 or 988.   She reported understanding and had no  concerns.     Physical Findings:  Musculoskeletal: Strength & Muscle Tone: within normal limits Gait & Station: normal Patient leans: N/A   Psychiatric Specialty Exam:  Presentation  General Appearance: Appropriate for Environment; Casual  Eye Contact:Good  Speech:Clear and Coherent; Normal Rate  Speech Volume:Normal  Handedness:Right   Mood and Affect  Mood:Euthymic  Affect:Appropriate; Congruent (bright)   Thought Process  Thought Processes:Coherent; Goal Directed  Descriptions of Associations:Intact  Orientation:Full (Time, Place and Person)  Thought Content:Logical No SI, HI, or AVH. No Paranoia, Ideas of Reference, or First Rank symptoms.  History of Schizophrenia/Schizoaffective disorder:No data recorded Duration of Psychotic Symptoms:No data recorded Hallucinations:Hallucinations: None  Ideas of Reference:None  Suicidal Thoughts:Suicidal Thoughts: No  Homicidal Thoughts:Homicidal Thoughts: No   Sensorium  Memory:Immediate Fair; Recent Fair  Judgment:Good  Insight:Good   Executive Functions  Concentration:Good  Attention Span:Good  [place] of Knowledge:Good  Language:Good   Psychomotor Activity  Psychomotor Activity:Psychomotor Activity: Normal   Assets  Assets:Communication Skills; Desire for Improvement; Physical Health; Resilience   Sleep  Sleep:Sleep: Good Number of Hours of Sleep: 6.5    Physical Exam: Physical Exam Vitals and nursing note reviewed.  Constitutional:      General: She is not in acute distress.    Appearance: Normal appearance. She is normal weight. She is not ill-appearing or toxic-appearing.  HENT:     Head: Normocephalic and atraumatic.  Pulmonary:     Effort: Pulmonary effort is normal.  Musculoskeletal:        General: Normal range of motion.  Neurological:     General: No focal deficit present.     Mental Status: She is alert.    Review of Systems  Respiratory:  Negative  for cough and shortness of breath.   Cardiovascular:  Negative for chest pain.  Gastrointestinal:  Negative for abdominal pain, constipation, diarrhea, nausea and vomiting.  Neurological:  Negative for dizziness, weakness and headaches.  Psychiatric/Behavioral:  Negative for depression, hallucinations and suicidal ideas. The patient is not nervous/anxious.    Blood pressure 110/84, pulse 88, temperature 98 F (36.7 C), resp. rate 17, height '5\' 7"'$  (1.702 m), weight 77.1 kg, SpO2 100 %. Body mass index is 26.63 kg/m.   Social History   Tobacco Use  Smoking Status Some Days   Types: Cigarettes  Smokeless Tobacco Never  Tobacco Comments   pt states quit 1-2 years ago    Tobacco Cessation:  A prescription for an FDA-approved tobacco cessation medication was offered at discharge and the patient refused   Blood Alcohol level:  Lab Results  Component Value Date   ETH <10 05/29/2022   ETH  06/24/2009    6        LOWEST DETECTABLE LIMIT FOR SERUM ALCOHOL IS 5  mg/dL FOR MEDICAL PURPOSES ONLY    Metabolic Disorder Labs:  Lab Results  Component Value Date   HGBA1C 5.1 05/29/2022   MPG 99.67 05/29/2022   Lab Results  Component Value Date   PROLACTIN 5.6 05/29/2022   Lab Results  Component Value Date   CHOL 202 (H) 05/29/2022   TRIG 78 05/29/2022   HDL 103 05/29/2022   CHOLHDL 2.0 05/29/2022   VLDL 16 05/29/2022   LDLCALC 83 05/29/2022    See Psychiatric Specialty Exam and Suicide Risk Assessment completed by Attending Physician prior to discharge.  Discharge destination:  Other:  Mother's Home  Is patient on multiple antipsychotic therapies at discharge:  No   Has Patient had three or more failed trials of antipsychotic monotherapy by history:  No  Recommended Plan for Multiple Antipsychotic Therapies: NA  Discharge Instructions     Diet - low sodium heart healthy   Complete by: As directed    Increase activity slowly   Complete by: As directed        Allergies as of 06/07/2022       Reactions   Latex Hives, Swelling   Peanut-containing Drug Products Itching        Medication List     TAKE these medications      Indication  sertraline 25 MG tablet Commonly known as: ZOLOFT Take 3 tablets (75 mg total) by mouth daily. Start taking on: June 08, 2022  Indication: Major Depressive Disorder   traZODone 150 MG tablet Commonly known as: DESYREL Take 1 tablet (150 mg total) by mouth at bedtime as needed for up to 15 days for sleep.  Indication: [place]. Go on 07/03/2022.   Why: Please present for scheduled appointment on 07/03/2022 at 2:30 pm. Contact information: [place] [address] [phone]        [facility]. Go to.   Specialty: Behavioral Health Why: Please go to this provider for therapy and medication management services on new patient assessment days:  Monday or Wednesday, Arrive at 7:30 am. Contact information: [place] [phone]                Follow-up recommendations/Comments:    Activity: as tolerated   Diet: heart healthy   Other: -Follow-up with your outpatient psychiatric provider -instructions on appointment date, time, and address (location) are provided to you in discharge paperwork.   -Take your psychiatric medications as prescribed at discharge - instructions are provided to you in the discharge paperwork   -Follow-up with outpatient primary care doctor and other specialists -for management of chronic medical disease, including: None   -Testing: Follow-up with outpatient provider for abnormal lab results: Elevated Cholesterol.   -Recommend abstinence from alcohol, tobacco, and other illicit drug use at discharge.    -If your psychiatric symptoms recur, worsen, or if you have side  effects to your psychiatric medications, call your outpatient psychiatric provider, 911, 988 or go to the nearest emergency department.   -If suicidal thoughts recur, call your outpatient psychiatric provider, 911, 988 or go to the nearest emergency department.  Signed: Briant Cedar, MD 06/07/2022, 3:49 PM

## 2022-06-07 NOTE — [facility] Suicide Risk Assessment (Addendum)
Suicide Risk Assessment  Discharge Assessment    [facility] Discharge Suicide Risk Assessment   Principal Problem: MDD (major depressive disorder), recurrent severe, without psychosis ([place]) Discharge Diagnoses: Principal Problem:   MDD (major depressive disorder), recurrent severe, without psychosis ([place])  During the patient's hospitalization, patient had extensive initial psychiatric evaluation, and follow-up psychiatric evaluations every day.  Psychiatric diagnoses provided upon initial assessment:  MDD (major depressive disorder), recurrent severe, without psychosis   Patient's psychiatric medications were adjusted on admission: She was started on Zoloft.  During the hospitalization, other adjustments were made to the patient's psychiatric medication regimen: Her Zoloft was Titrated during admission.  Her sleep was an issue so her Trazodone was titrated during his stay.  Gradually, patient started adjusting to milieu.   Patient's care was discussed during the interdisciplinary team meeting every day during the hospitalization.  The patient is not having side effects to prescribed psychiatric medication.  The patient reports their target psychiatric symptoms of depression and SI responded well to the psychiatric medications, and the patient reports overall benefit other psychiatric hospitalization. Supportive psychotherapy was provided to the patient. The patient also participated in regular group therapy while admitted.   Labs were reviewed with the patient, and abnormal results were discussed with the patient.  The patient denied having suicidal thoughts more than 48 hours prior to discharge.  Patient denies having homicidal thoughts.  Patient denies having auditory hallucinations.  Patient denies any visual hallucinations.  Patient denies having paranoid thoughts.  The patient is able to verbalize their individual safety plan to this provider.  It is recommended to the patient to  continue psychiatric medications as prescribed, after discharge from the hospital.    It is recommended to the patient to follow up with your outpatient psychiatric provider and PCP.  Discussed with the patient, the impact of alcohol, drugs, tobacco have been there overall psychiatric and medical wellbeing, and total abstinence from substance use was recommended the patient.  Total Time spent with patient: 15 minutes  Musculoskeletal: Strength & Muscle Tone: within normal limits Gait & Station: normal Patient leans: N/A  Psychiatric Specialty Exam  Presentation  General Appearance: Appropriate for Environment; Casual  Eye Contact:Good  Speech:Clear and Coherent; Normal Rate  Speech Volume:Normal  Handedness:Right   Mood and Affect  Mood:Euthymic  Duration of Depression Symptoms: No data recorded Affect:Appropriate; Congruent (bright)   Thought Process  Thought Processes:Coherent; Goal Directed  Descriptions of Associations:Intact  Orientation:Full (Time, Place and Person)  Thought Content:Logical No SI, HI, or AVH. No Paranoia, Ideas of Reference, or First Rank symptoms.  History of Schizophrenia/Schizoaffective disorder:No data recorded Duration of Psychotic Symptoms:No data recorded Hallucinations:Hallucinations: None  Ideas of Reference:None  Suicidal Thoughts:Suicidal Thoughts: No  Homicidal Thoughts:Homicidal Thoughts: No   Sensorium  Memory:Immediate Fair; Recent Fair  Judgment:Good  Insight:Good   Executive Functions  Concentration:Good  Attention Span:Good  [place] of Knowledge:Good  Language:Good   Psychomotor Activity  Psychomotor Activity:Psychomotor Activity: Normal   Assets  Assets:Communication Skills; Desire for Improvement; Physical Health; Resilience   Sleep  Sleep:Sleep: Good Number of Hours of Sleep: 6.5   Physical Exam: Physical Exam Vitals and nursing note reviewed.  Constitutional:       General: She is not in acute distress.    Appearance: Normal appearance. She is normal weight. She is not ill-appearing or toxic-appearing.  HENT:     Head: Normocephalic and atraumatic.  Pulmonary:     Effort: Pulmonary effort is normal.  Musculoskeletal:  General: Normal range of motion.  Neurological:     General: No focal deficit present.     Mental Status: She is alert.    Review of Systems  Respiratory:  Negative for cough and shortness of breath.   Cardiovascular:  Negative for chest pain.  Gastrointestinal:  Negative for abdominal pain, constipation, diarrhea, nausea and vomiting.  Neurological:  Negative for dizziness, weakness and headaches.  Psychiatric/Behavioral:  Negative for depression and hallucinations. The patient is not nervous/anxious.    Blood pressure 110/84, pulse 88, temperature 98 F (36.7 C), resp. rate 17, height '5\' 7"'$  (1.702 m), weight 77.1 kg, SpO2 100 %. Body mass index is 26.63 kg/m.  Mental Status Per Nursing Assessment::   On Admission:  Suicidal ideation indicated by patient, Self-harm thoughts  Demographic Factors:  Low socioeconomic status  Loss Factors: Decrease in vocational status and Financial problems/change in socioeconomic status  Historical Factors: Prior suicide attempts and Impulsivity  Risk Reduction Factors:   Positive coping skills or problem solving skills  Continued Clinical Symptoms:  None  Cognitive Features That Contribute To Risk:  None    Suicide Risk:  Mild:  No current SI, however, patient has had a prior Suicide Attempt.  There are no identifiable plans, no associated intent, mild dysphoria and related symptoms, good self-control (both objective and subjective assessment), few other risk factors, and identifiable protective factors, including available and accessible social support.   Follow-up Information     [place]. Go on 07/03/2022.   Why: Please present for  scheduled appointment on 07/03/2022 at 2:30 pm. Contact information: [place] [phone]        [facility]. Go to.   Specialty: Behavioral Health Why: Please go to this provider for therapy and medication management services on new patient assessment days:  Monday or Wednesday, Arrive at 7:30 am. Contact information: [place] [phone]                Plan Of Care/Follow-up recommendations:   Activity: as tolerated  Diet: heart healthy  Other: -Follow-up with your outpatient psychiatric provider -instructions on appointment date, time, and address (location) are provided to you in discharge paperwork.  -Take your psychiatric medications as prescribed at discharge - instructions are provided to you in the discharge paperwork  -Follow-up with outpatient primary care doctor and other specialists -for management of chronic medical disease, including: None  -Testing: Follow-up with outpatient provider for abnormal lab results: Elevated Cholesterol.  -Recommend abstinence from alcohol, tobacco, and other illicit drug use at discharge.   -If your psychiatric symptoms recur, worsen, or if you have side effects to your psychiatric medications, call your outpatient psychiatric provider, 911, 988 or go to the nearest emergency department.  -If suicidal thoughts recur, call your outpatient psychiatric provider, 911, 988 or go to the nearest emergency department.   Briant Cedar, MD 06/07/2022, 8:28 AM

## 2022-07-03 ENCOUNTER — Ambulatory Visit: Payer: No Typology Code available for payment source | Attending: Nurse Practitioner | Admitting: Nurse Practitioner

## 2022-07-03 ENCOUNTER — Encounter: Payer: Self-pay | Admitting: Nurse Practitioner

## 2022-07-03 ENCOUNTER — Other Ambulatory Visit: Payer: Self-pay

## 2022-07-03 VITALS — BP 121/79 | HR 93 | Temp 98.4°F | Resp 16 | Wt 174.2 lb

## 2022-07-03 DIAGNOSIS — R7989 Other specified abnormal findings of blood chemistry: Secondary | ICD-10-CM

## 2022-07-03 DIAGNOSIS — Z1231 Encounter for screening mammogram for malignant neoplasm of breast: Secondary | ICD-10-CM

## 2022-07-03 DIAGNOSIS — Z1159 Encounter for screening for other viral diseases: Secondary | ICD-10-CM

## 2022-07-03 DIAGNOSIS — Z1211 Encounter for screening for malignant neoplasm of colon: Secondary | ICD-10-CM

## 2022-07-03 DIAGNOSIS — F418 Other specified anxiety disorders: Secondary | ICD-10-CM

## 2022-07-03 DIAGNOSIS — Z7689 Persons encountering health services in other specified circumstances: Secondary | ICD-10-CM

## 2022-07-03 DIAGNOSIS — F5105 Insomnia due to other mental disorder: Secondary | ICD-10-CM | POA: Diagnosis not present

## 2022-07-03 MED ORDER — SERTRALINE HCL 50 MG PO TABS
50.0000 mg | ORAL_TABLET | Freq: Every day | ORAL | 1 refills | Status: DC
  Filled 2022-07-03: qty 90, 90d supply, fill #0
  Filled 2023-02-20: qty 90, 90d supply, fill #1

## 2022-07-03 MED ORDER — HYDROXYZINE HCL 25 MG PO TABS
25.0000 mg | ORAL_TABLET | Freq: Three times a day (TID) | ORAL | 3 refills | Status: DC | PRN
  Filled 2022-07-03: qty 60, 10d supply, fill #0

## 2022-07-03 MED ORDER — TRAZODONE HCL 150 MG PO TABS
150.0000 mg | ORAL_TABLET | Freq: Every evening | ORAL | 1 refills | Status: DC | PRN
  Filled 2022-07-03: qty 90, 90d supply, fill #0
  Filled 2023-02-20: qty 90, 90d supply, fill #1

## 2022-07-03 NOTE — Progress Notes (Signed)
Depression  [place]

## 2022-07-03 NOTE — Progress Notes (Signed)
Assessment & Plan:  Anna [name] was seen today for depression.  Diagnoses and all orders for this visit:  Encounter to establish care  Breast cancer screening by mammogram -     Cancel: MM DIAG BREAST TOMO BILATERAL; Future -     MM 3D SCREEN BREAST BILATERAL; Future  Low thyroid stimulating hormone (TSH) level -     Thyroid Panel With TSH  Need for hepatitis C screening test -     HCV Ab w Reflex to Quant PCR -     Interpretation:  Colon cancer screening -     Ambulatory referral to Gastroenterology  Insomnia secondary to depression with anxiety -     hydrOXYzine (ATARAX) 25 MG tablet; Take 1-2 tablets (25-50 mg total) by mouth 3 (three) times daily as needed. -     sertraline (ZOLOFT) 50 MG tablet; Take 1 tablet (50 mg total) by mouth daily. -     traZODone (DESYREL) 150 MG tablet; Take 1 tablet (150 mg total) by mouth at bedtime as needed for sleep.    Patient has been counseled on age-appropriate routine health concerns for screening and prevention. These are reviewed and up-to-date. Referrals have been placed accordingly. Immunizations are up-to-date or declined.    Subjective:   Chief Complaint  Patient presents with   Depression   HPI Anna [name] 46 y.o. female presents to office today to establish care.  She has a past medical history of Anemia, anxiety, depression, insomnia, and Hypertension.    She is currently taking zoloft and trazodone. Lost her apartment a few months ago. No permanent residence at this time.   Patient has been counseled on age-appropriate routine health concerns for screening and prevention. These are reviewed and up-to-date. Referrals have been placed accordingly. Immunizations are up-to-date or declined.     PAP: S/P Hysterectomy MAMMOGRAM: overdue. Referral to Breast center  COLONOSCOPY: overdue. Referral to GI   HTN Well controlled. Currently not taking any hypertensives.  BP Readings from Last 3 Encounters:  07/03/22 121/79   06/20/21 (!) 122/105  05/24/21 (!) 141/76    ROS  . Past Medical History:  Diagnosis Date   Anemia    Hypertension    only elevated at MD office per pt. no meds    Past Surgical History:  Procedure Laterality Date   FRACTURE SURGERY  right ankle   HYSTERECTOMY ABDOMINAL WITH SALPINGECTOMY Bilateral 05/22/2021   Procedure: HYSTERECTOMY ABDOMINAL WITH SALPINGECTOMY AND LEFT OOPHORECTOMY;  Surgeon: [name], MD;  Location: [facility];  Service: Gynecology;  Laterality: Bilateral;  TAP BLOCK    Family History  Problem Relation Age of Onset   Healthy Mother    Healthy Father     Social History Reviewed with no changes to be made today.   Outpatient Medications Prior to Visit  Medication Sig Dispense Refill   sertraline (ZOLOFT) 25 MG tablet Take 3 tablets (75 mg total) by mouth daily. 90 tablet 0   traZODone (DESYREL) 150 MG tablet Take 1 tablet (150 mg total) by mouth at bedtime as needed for up to 15 days for sleep. (Patient not taking: Reported on 07/03/2022) 15 tablet 0   No facility-administered medications prior to visit.    Allergies  Allergen Reactions   Latex Hives and Swelling   Peanut-Containing Drug Products Itching       Objective:    BP 121/79 (BP Location: Left Arm, Patient Position: Sitting, Cuff Size: Normal) Comment: Retake 181/124  Pulse 93   Temp  98.4 F (36.9 C) (Oral)   Resp 16   Wt 174 lb 3.2 oz (79 kg)   LMP 04/12/2021 (Approximate)   SpO2 98%   BMI 27.28 kg/m  Wt Readings from Last 3 Encounters:  07/03/22 174 lb 3.2 oz (79 kg)  05/22/21 167 lb (75.8 kg)  05/18/21 170 lb 1.6 oz (77.2 kg)    Physical Exam       Patient has been counseled extensively about nutrition and exercise as well as the importance of adherence with medications and regular follow-up. The patient was given clear instructions to go to ER or return to medical center if symptoms don't improve, worsen or new problems develop. The patient verbalized understanding.    Follow-up: Return for 2 weeks virtual or tele anxiety and depression.   Gildardo Pounds, FNP-BC [facility] and [place] [facility] [place], [place]   07/08/2022, 8:23 PM

## 2022-07-04 ENCOUNTER — Ambulatory Visit: Payer: Self-pay

## 2022-07-04 LAB — THYROID PANEL WITH TSH
Free Thyroxine Index: 1.7 (ref 1.2–4.9)
T3 Uptake Ratio: 30 % (ref 24–39)
T4, Total: 5.5 ug/dL (ref 4.5–12.0)
TSH: 0.269 u[IU]/mL — ABNORMAL LOW (ref 0.450–4.500)

## 2022-07-04 LAB — HCV INTERPRETATION

## 2022-07-04 LAB — HCV AB W REFLEX TO QUANT PCR: HCV Ab: NONREACTIVE

## 2022-07-04 NOTE — Telephone Encounter (Signed)
  Chief Complaint: fall Symptoms: fell hit head on wall and knee and leg pain, knot on L side of head Frequency: yesterday Pertinent Negatives: NA Disposition: '[x]'$ ED /'[]'$ Urgent Care (no appt availability in office) / '[]'$ Appointment(In office/virtual)/ '[]'$  [place] [facility]/ '[]'$ Home Care/ '[]'$ Refused Recommended Disposition /'[]'$ [place] [facility]/ '[]'$  Follow-up with PCP Additional Notes: pt states she is unsure what happened but "blacked out" and fell, hit head against wall and unsure how knees and legs got hurt but feels like they are slightly swollen. Pain 9/10 to LE. I advised pt d/t hitting head to go to ED for evaluation as protocol. Pt states she will go and verbalized understanding.   Reason for Disposition  Patient sounds very sick or weak to the triager  Answer Assessment - Initial Assessment Questions 1. MECHANISM: "How did the fall happen?"     Was brushing teeth and felt "weird" and then collapsed and hit the wall  3. ONSET: "When did the fall happen?" (e.g., minutes, hours, or days ago)     Last night  4. LOCATION: "What part of the body hit the ground?" (e.g., back, buttocks, head, hips, knees, hands, head, stomach)     Head and knees 5. INJURY: "Did you hurt (injure) yourself when you fell?" If Yes, ask: "What did you injure? Tell me more about this?" (e.g., body area; type of injury; pain severity)"     Knot of L side of head quarter size  6. PAIN: "Is there any pain?" If Yes, ask: "How bad is the pain?" (e.g., Scale 1-10; or mild,  moderate, severe)   - NONE (0): No pain   - MILD (1-3): Doesn't interfere with normal activities    - MODERATE (4-7): Interferes with normal activities or awakens from sleep    - SEVERE (8-10): Excruciating pain, unable to do any normal activities      2 for head but 9 for knees, legs  9. OTHER SYMPTOMS: "Do you have any other symptoms?" (e.g., dizziness, fever, weakness; new onset or worsening).      Feeling weird feeling, legs sore and  slightly swelling 10. CAUSE: "What do you think caused the fall (or falling)?" (e.g., tripped, dizzy spell)       Took anxiety medications for first time  Protocols used: Falls and [facility]

## 2022-07-04 NOTE — Telephone Encounter (Signed)
Noted  

## 2022-07-08 ENCOUNTER — Encounter: Payer: Self-pay | Admitting: Nurse Practitioner

## 2022-07-08 NOTE — Progress Notes (Signed)
Assessment & Plan:  Anna [name] was seen today for establish care.  Diagnoses and all orders for this visit:  Encounter to establish care  Breast cancer screening by mammogram -     Cancel: MM DIAG BREAST TOMO BILATERAL; Future -     MM 3D SCREEN BREAST BILATERAL; Future  Low thyroid stimulating hormone (TSH) level -     Thyroid Panel With TSH  Need for hepatitis C screening test -     HCV Ab w Reflex to Quant PCR -     Interpretation:  Colon cancer screening -     Ambulatory referral to Gastroenterology  Insomnia secondary to depression with anxiety -     hydrOXYzine (ATARAX) 25 MG tablet; Take 1-2 tablets (25-50 mg total) by mouth 3 (three) times daily as needed. -     sertraline (ZOLOFT) 50 MG tablet; Take 1 tablet (50 mg total) by mouth daily. -     traZODone (DESYREL) 150 MG tablet; Take 1 tablet (150 mg total) by mouth at bedtime as needed for sleep.    Patient has been counseled on age-appropriate routine health concerns for screening and prevention. These are reviewed and up-to-date. Referrals have been placed accordingly. Immunizations are up-to-date or declined.    Subjective:   Chief Complaint  Patient presents with   Anna [name] 46 y.o. female presents to office today to establish care.  She has a past medical history of Anemia, anxiety, depression, insomnia, and Hypertension.   She is currently taking zoloft and trazodone. Endorses breakthrough anxiety symptoms such as racing thoughts, feeling panicky.  Lost her apartment a few months ago. No permanent residence at this time.   Patient has been counseled on age-appropriate routine health concerns for screening and prevention. These are reviewed and up-to-date. Referrals have been placed accordingly. Immunizations are up-to-date or declined.     PAP: S/P Hysterectomy MAMMOGRAM: overdue. Referral to Breast center  COLONOSCOPY: overdue. Referral to GI   HTN Well controlled. Currently not  taking any hypertensives.  BP Readings from Last 3 Encounters:  07/03/22 121/79  06/20/21 (!) 122/105  05/24/21 (!) 141/76    Review of Systems  Constitutional:  Negative for fever, malaise/fatigue and weight loss.  HENT: Negative.  Negative for nosebleeds.   Eyes: Negative.  Negative for blurred vision, double vision and photophobia.  Respiratory: Negative.  Negative for cough and shortness of breath.   Cardiovascular: Negative.  Negative for chest pain, palpitations and leg swelling.  Gastrointestinal: Negative.  Negative for heartburn, nausea and vomiting.  Musculoskeletal: Negative.  Negative for myalgias.  Neurological: Negative.  Negative for dizziness, focal weakness, seizures and headaches.  Psychiatric/Behavioral:  Positive for depression. Negative for suicidal ideas. The patient is nervous/anxious and has insomnia.     . Past Medical History:  Diagnosis Date   Anemia    Hypertension    only elevated at MD office per pt. no meds    Past Surgical History:  Procedure Laterality Date   FRACTURE SURGERY  right ankle   HYSTERECTOMY ABDOMINAL WITH SALPINGECTOMY Bilateral 05/22/2021   Procedure: HYSTERECTOMY ABDOMINAL WITH SALPINGECTOMY AND LEFT OOPHORECTOMY;  Surgeon: [name], MD;  Location: [facility];  Service: Gynecology;  Laterality: Bilateral;  TAP BLOCK    Family History  Problem Relation Age of Onset   Healthy Mother    Healthy Father     Social History Reviewed with no changes to be made today.   Outpatient Medications Prior to Visit  Medication Sig Dispense Refill   sertraline (ZOLOFT) 25 MG tablet Take 3 tablets (75 mg total) by mouth daily. 90 tablet 0   traZODone (DESYREL) 150 MG tablet Take 1 tablet (150 mg total) by mouth at bedtime as needed for up to 15 days for sleep. (Patient not taking: Reported on 07/03/2022) 15 tablet 0   No facility-administered medications prior to visit.    Allergies  Allergen Reactions   Latex Hives and Swelling    Peanut-Containing Drug Products Itching       Objective:    BP 121/79 (BP Location: Left Arm, Patient Position: Sitting, Cuff Size: Normal) Comment: Retake 181/124  Pulse 93   Temp 98.4 F (36.9 C) (Oral)   Resp 16   Wt 174 lb 3.2 oz (79 kg)   LMP 04/12/2021 (Approximate)   SpO2 98%   BMI 27.28 kg/m  Wt Readings from Last 3 Encounters:  07/03/22 174 lb 3.2 oz (79 kg)  05/22/21 167 lb (75.8 kg)  05/18/21 170 lb 1.6 oz (77.2 kg)    Physical Exam Vitals and nursing note reviewed.  Constitutional:      Appearance: She is well-developed.  HENT:     Head: Normocephalic and atraumatic.  Cardiovascular:     Rate and Rhythm: Normal rate and regular rhythm.     Heart sounds: Normal heart sounds. No murmur heard.    No friction rub. No gallop.  Pulmonary:     Effort: Pulmonary effort is normal. No tachypnea or respiratory distress.     Breath sounds: Normal breath sounds. No decreased breath sounds, wheezing, rhonchi or rales.  Chest:     Chest wall: No tenderness.  Abdominal:     General: Bowel sounds are normal.     Palpations: Abdomen is soft.  Musculoskeletal:        General: Normal range of motion.     Cervical back: Normal range of motion.  Skin:    General: Skin is warm and dry.  Neurological:     Mental Status: She is alert and oriented to person, place, and time.     Coordination: Coordination normal.  Psychiatric:        Behavior: Behavior normal. Behavior is cooperative.        Thought Content: Thought content normal.        Judgment: Judgment normal.          Patient has been counseled extensively about nutrition and exercise as well as the importance of adherence with medications and regular follow-up. The patient was given clear instructions to go to ER or return to medical center if symptoms don't improve, worsen or new problems develop. The patient verbalized understanding.   Follow-up: Return for 2 weeks virtual or tele anxiety and depression.    Gildardo Pounds, FNP-BC [facility] and [facility], [place]   07/08/2022, 8:33 PM

## 2022-09-12 ENCOUNTER — Ambulatory Visit (HOSPITAL_COMMUNITY)
Admission: EM | Admit: 2022-09-12 | Discharge: 2022-09-12 | Disposition: A | Discharge status: Other Acute Inpatient Hospital | Payer: No Payment, Other | Attending: Psychiatry | Admitting: Psychiatry

## 2022-09-12 DIAGNOSIS — F1290 Cannabis use, unspecified, uncomplicated: Secondary | ICD-10-CM | POA: Insufficient documentation

## 2022-09-12 DIAGNOSIS — Z3202 Encounter for pregnancy test, result negative: Secondary | ICD-10-CM | POA: Diagnosis not present

## 2022-09-12 DIAGNOSIS — R45851 Suicidal ideations: Secondary | ICD-10-CM

## 2022-09-12 DIAGNOSIS — F419 Anxiety disorder, unspecified: Secondary | ICD-10-CM | POA: Insufficient documentation

## 2022-09-12 DIAGNOSIS — Z1152 Encounter for screening for COVID-19: Secondary | ICD-10-CM | POA: Insufficient documentation

## 2022-09-12 DIAGNOSIS — R4689 Other symptoms and signs involving appearance and behavior: Secondary | ICD-10-CM

## 2022-09-12 DIAGNOSIS — Z79899 Other long term (current) drug therapy: Secondary | ICD-10-CM | POA: Insufficient documentation

## 2022-09-12 DIAGNOSIS — Z9151 Personal history of suicidal behavior: Secondary | ICD-10-CM | POA: Insufficient documentation

## 2022-09-12 DIAGNOSIS — F1410 Cocaine abuse, uncomplicated: Secondary | ICD-10-CM

## 2022-09-12 DIAGNOSIS — F69 Unspecified disorder of adult personality and behavior: Secondary | ICD-10-CM

## 2022-09-12 DIAGNOSIS — Z5900 Homelessness unspecified: Secondary | ICD-10-CM | POA: Insufficient documentation

## 2022-09-12 DIAGNOSIS — F32A Depression, unspecified: Secondary | ICD-10-CM | POA: Insufficient documentation

## 2022-09-12 DIAGNOSIS — R451 Restlessness and agitation: Secondary | ICD-10-CM | POA: Insufficient documentation

## 2022-09-12 LAB — CBC WITH DIFFERENTIAL/PLATELET
Abs Immature Granulocytes: 0.03 10*3/uL (ref 0.00–0.07)
Basophils Absolute: 0 10*3/uL (ref 0.0–0.1)
Basophils Relative: 1 %
Eosinophils Absolute: 0.2 10*3/uL (ref 0.0–0.5)
Eosinophils Relative: 2 %
HCT: 38.9 % (ref 36.0–46.0)
Hemoglobin: 13.3 g/dL (ref 12.0–15.0)
Immature Granulocytes: 0 %
Lymphocytes Relative: 18 %
Lymphs Abs: 1.5 10*3/uL (ref 0.7–4.0)
MCH: 29.6 pg (ref 26.0–34.0)
MCHC: 34.2 g/dL (ref 30.0–36.0)
MCV: 86.4 fL (ref 80.0–100.0)
Monocytes Absolute: 0.5 10*3/uL (ref 0.1–1.0)
Monocytes Relative: 6 %
Neutro Abs: 5.9 10*3/uL (ref 1.7–7.7)
Neutrophils Relative %: 73 %
Platelets: 431 10*3/uL — ABNORMAL HIGH (ref 150–400)
RBC: 4.5 MIL/uL (ref 3.87–5.11)
RDW: 16 % — ABNORMAL HIGH (ref 11.5–15.5)
WBC: 8.2 10*3/uL (ref 4.0–10.5)
nRBC: 0 % (ref 0.0–0.2)

## 2022-09-12 LAB — RESP PANEL BY RT-PCR (FLU A&B, COVID) ARPGX2
Influenza A by PCR: NEGATIVE
Influenza B by PCR: NEGATIVE
SARS Coronavirus 2 by RT PCR: NEGATIVE

## 2022-09-12 LAB — COMPREHENSIVE METABOLIC PANEL
ALT: 43 U/L (ref 0–44)
AST: 50 U/L — ABNORMAL HIGH (ref 15–41)
Albumin: 3.9 g/dL (ref 3.5–5.0)
Alkaline Phosphatase: 88 U/L (ref 38–126)
Anion gap: 13 (ref 5–15)
BUN: 6 mg/dL (ref 6–20)
CO2: 24 mmol/L (ref 22–32)
Calcium: 9.3 mg/dL (ref 8.9–10.3)
Chloride: 101 mmol/L (ref 98–111)
Creatinine, Ser: 0.69 mg/dL (ref 0.44–1.00)
GFR, Estimated: >60 mL/min (ref >60)
Glucose, Bld: 89 mg/dL (ref 70–99)
Potassium: 3.4 mmol/L — ABNORMAL LOW (ref 3.5–5.1)
Sodium: 138 mmol/L (ref 135–145)
Total Bilirubin: 0.6 mg/dL (ref 0.3–1.2)
Total Protein: 7.8 g/dL (ref 6.5–8.1)

## 2022-09-12 LAB — POCT URINE DRUG SCREEN - MANUAL ENTRY (I-SCREEN)
POC Amphetamine UR: NOT DETECTED
POC Buprenorphine (BUP): NOT DETECTED
POC Cocaine UR: POSITIVE — AB
POC Marijuana UR: POSITIVE — AB
POC Methadone UR: NOT DETECTED
POC Methamphetamine UR: NOT DETECTED
POC Morphine: NOT DETECTED
POC Oxazepam (BZO): NOT DETECTED
POC Oxycodone UR: NOT DETECTED
POC Secobarbital (BAR): NOT DETECTED

## 2022-09-12 LAB — LIPID PANEL
Cholesterol: 280 mg/dL — ABNORMAL HIGH (ref 0–200)
HDL: >135 mg/dL (ref >40)
Triglycerides: 66 mg/dL (ref <150)
VLDL: 13 mg/dL (ref 0–40)

## 2022-09-12 LAB — POC SARS CORONAVIRUS 2 AG: SARSCOV2ONAVIRUS 2 AG: NEGATIVE

## 2022-09-12 LAB — HEMOGLOBIN A1C
Hgb A1c MFr Bld: 5.1 % (ref 4.8–5.6)
Mean Plasma Glucose: 99.67 mg/dL

## 2022-09-12 LAB — TSH: TSH: 0.533 u[IU]/mL (ref 0.350–4.500)

## 2022-09-12 LAB — POCT PREGNANCY, URINE: Preg Test, Ur: NEGATIVE

## 2022-09-12 LAB — ETHANOL: Alcohol, Ethyl (B): 49 mg/dL — ABNORMAL HIGH (ref <10)

## 2022-09-12 MED ORDER — CLONIDINE HCL 0.1 MG PO TABS: 0.1000 mg | ORAL_TABLET | Freq: Three times a day (TID) | ORAL | Status: DC | PRN

## 2022-09-12 MED ORDER — CLONIDINE HCL 0.1 MG PO TABS: 0.1000 mg | ORAL_TABLET | Freq: Three times a day (TID) | ORAL | 11 refills | Status: DC | PRN

## 2022-09-12 MED ORDER — ALUM & MAG HYDROXIDE-SIMETH 200-200-20 MG/5ML PO SUSP: 30.0000 mL | ORAL | Status: DC | PRN

## 2022-09-12 MED ORDER — LORAZEPAM 1 MG PO TABS
1.0000 mg | ORAL_TABLET | Freq: Once | ORAL | Status: AC
Start: 2022-09-12 — End: 2022-09-12
  Administered 2022-09-12: 1 mg via ORAL
  Filled 2022-09-12: qty 1

## 2022-09-12 MED ORDER — HYDROXYZINE HCL 25 MG PO TABS: 25.0000 mg | ORAL_TABLET | Freq: Three times a day (TID) | ORAL | 0 refills | Status: DC | PRN

## 2022-09-12 MED ORDER — ACETAMINOPHEN 325 MG PO TABS
650.0000 mg | ORAL_TABLET | Freq: Four times a day (QID) | ORAL | Status: DC | PRN
  Administered 2022-09-12: 650 mg via ORAL
  Filled 2022-09-12: qty 2

## 2022-09-12 MED ORDER — SERTRALINE HCL 50 MG PO TABS: 50.0000 mg | ORAL_TABLET | Freq: Every day | ORAL | Status: DC

## 2022-09-12 MED ORDER — MAGNESIUM HYDROXIDE 400 MG/5ML PO SUSP: 30.0000 mL | Freq: Every day | ORAL | Status: DC | PRN

## 2022-09-12 MED ORDER — HYDROXYZINE HCL 25 MG PO TABS
25.0000 mg | ORAL_TABLET | Freq: Three times a day (TID) | ORAL | Status: DC | PRN
  Administered 2022-09-12: 25 mg via ORAL
  Filled 2022-09-12: qty 1

## 2022-09-12 NOTE — ED Notes (Signed)
Patient observed sleeping but woke up slightly. Patient currently endorses passive SI with no plan and endorses auditory/visual hallucinations. When asked what does she see or hear patient stated " I just hear stuff" "see stuff." Patient was not able to go into detail but did deny any command hallucinations. Patient noted to continue to cough and clear her throat. Patient reported throat feels scratchy and like something is "stuck in it." Patient offered fluids and Providers made aware. Patient currently back asleep with no s/s of current distress.

## 2022-09-12 NOTE — ED Notes (Addendum)
Report called and given to Merdis Delay at [facility]. Patient discharging at this moment via safe transport with printed AVS, EMTALA, and all other transfer paperwork in brown envelope. All patient belongings returned. Patient in stable condition. No s/s of current distress.
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
## 2022-09-12 NOTE — Discharge Instructions (Addendum)
Patient is being transferred to [facility]  Attending Physician will be Cammie Mcgee, NP

## 2022-09-12 NOTE — Progress Notes (Signed)
Pt was accepted to [facility]  Pt meets inpatient criteria per  Molli Barrows, FNP  Attending Physician will be Cammie Mcgee, NP  Report can be called to: [phone]  Pt can arrive after: BED IS [place] Team notified: Humphreys, RN, Marinda Elk, RN, Molli Barrows, FNP, Melinda Crutch, RN  Nadara Mode, [place] 09/12/2022 @ 1:51 PM

## 2022-09-12 NOTE — Progress Notes (Addendum)
Inpatient Behavioral Health Placement  Pt meets inpatient criteria per Molli Barrows, FNP. At the request of Dr. Dwyane Dee pt to be faxed out.   Referral was sent to the following facilities;    Destination Service Provider Address Phone Fax  [facility]  [place], [place] [address] [place]  [facility]  [address] [place] [address] [phone]  [facility]  [place], [place] [address] [phone]  [facility]  [place] [address] [place] [address] [place]  [facility]  [address] [place] [address] [phone]  [facility]  [address], [place] [address] [phone]  Tom Grose [address], [place] [address] [phone]  [facility]  [address] [place] [address] [phone] [place]  [address], [place] [address] [phone] [facility]  [address] [place] [address] [phone] [facility]  [address] [place] [address] [place]  [address], [place] [address] [phone] [facility]  [address] [place] [address] [phone] [facility]  [address], [place] [address] [phone] [place]  [place], [place] [address] [phone]  [facility]  [address], [place] [address] [place]  [facility]  [address], [place]  [address] [phone]  [phone]   Situation ongoing,  CSW will follow up.   Benjaman Kindler, MSW, LCSWA 09/12/2022  @ 1:51 PM
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
## 2022-09-12 NOTE — ED Notes (Addendum)
Patient received atarax po prn due to anxiety.   1400: Patient observed resting calmly. No current distress.

## 2022-11-13 ENCOUNTER — Encounter (HOSPITAL_COMMUNITY): Payer: Self-pay

## 2022-11-13 ENCOUNTER — Ambulatory Visit (INDEPENDENT_AMBULATORY_CARE_PROVIDER_SITE_OTHER): Payer: No Payment, Other | Admitting: Mental Health

## 2022-11-13 DIAGNOSIS — F332 Major depressive disorder, recurrent severe without psychotic features: Secondary | ICD-10-CM | POA: Diagnosis not present
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
## 2022-11-19 ENCOUNTER — Ambulatory Visit (HOSPITAL_COMMUNITY): Payer: No Payment, Other | Admitting: Psychiatry

## 2022-11-20 IMAGING — CT CT ABD-PELV W/O CM
2 of 4 series · 16 of 46 positions shown, 18 images · non-contrast
Comparison: None.

CLINICAL DATA: Left lower quadrant pain

EXAM:
CT ABDOMEN AND PELVIS WITHOUT CONTRAST
TECHNIQUE: Multidetector CT imaging of the abdomen and pelvis was performed
following the standard protocol without IV contrast.

[Series 3: a/p w/o 5mm · axial · non-contrast · 0.87mm/px · z∈[+942,+1322]mm · 13 of 84 slices shown, 15 images]
[im 4/84  soft-tissue]
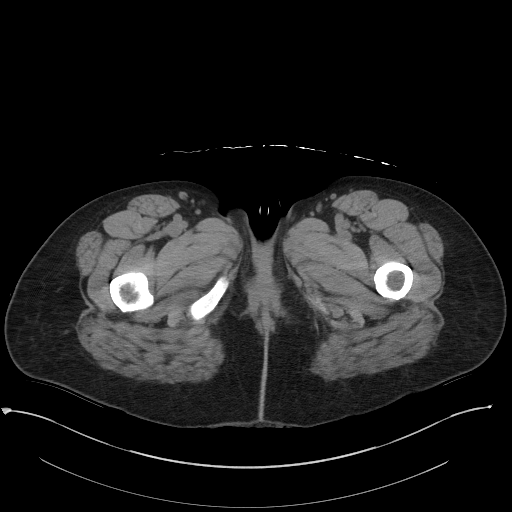
[im 4/84  bone]
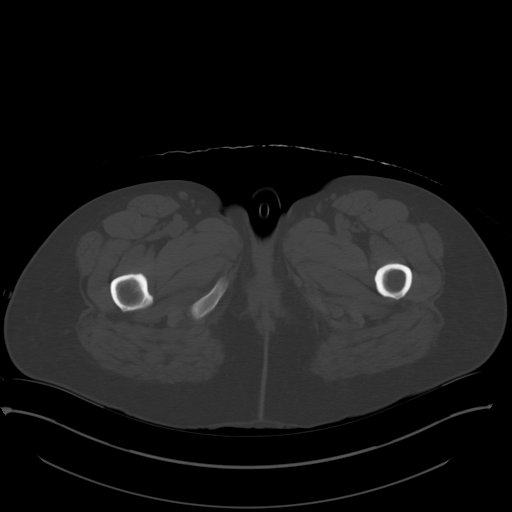
[im 10/84  soft-tissue]
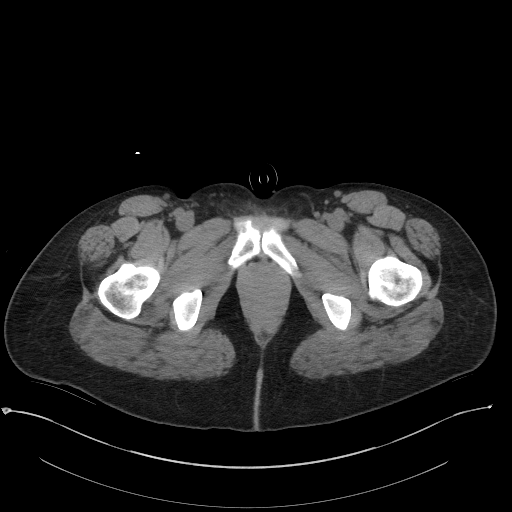
[im 16/84  soft-tissue]
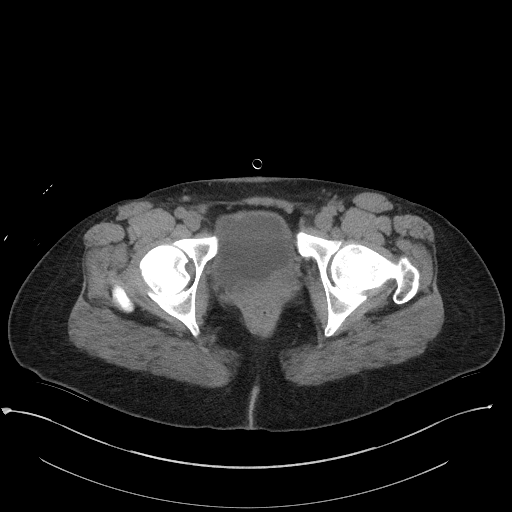
[im 23/84  soft-tissue]
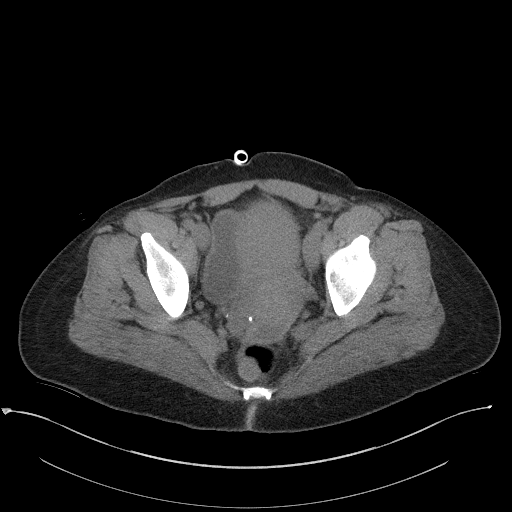
[im 29/84  soft-tissue]
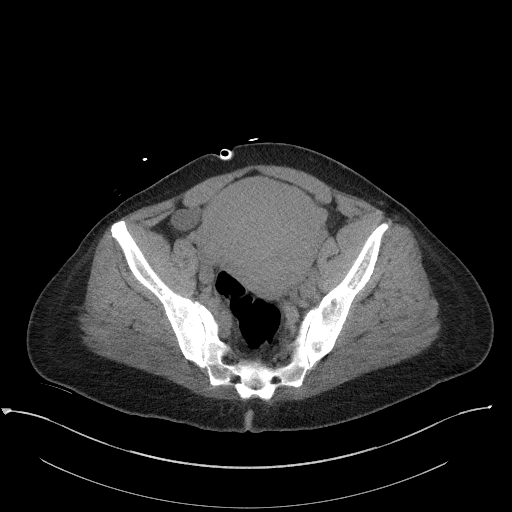
[im 36/84  soft-tissue]
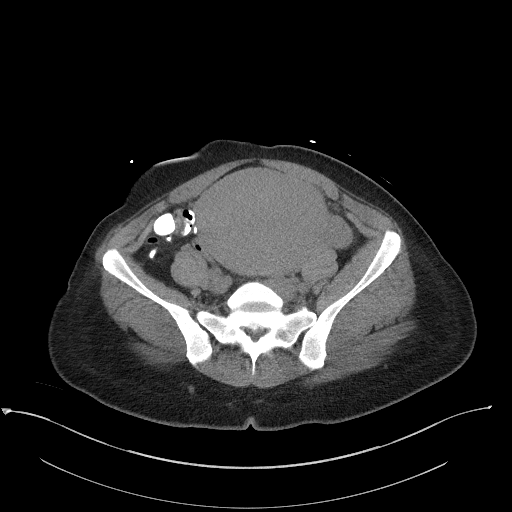
[im 42/84  soft-tissue]
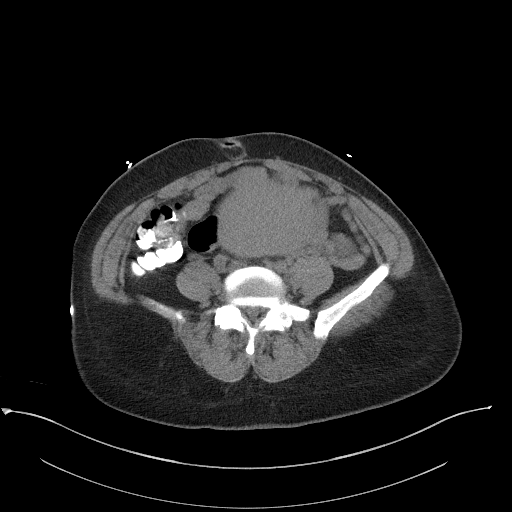
[im 48/84  soft-tissue]
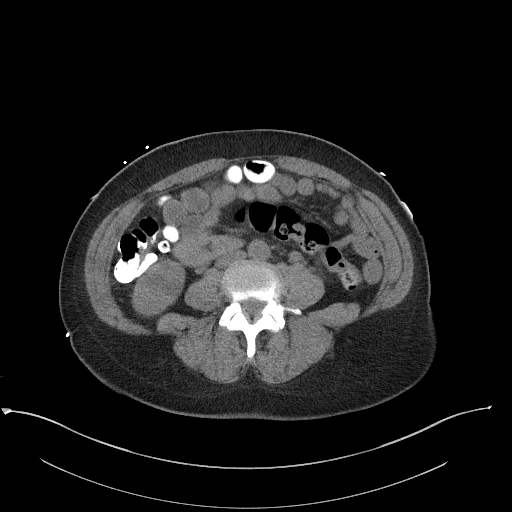
[im 55/84  soft-tissue]
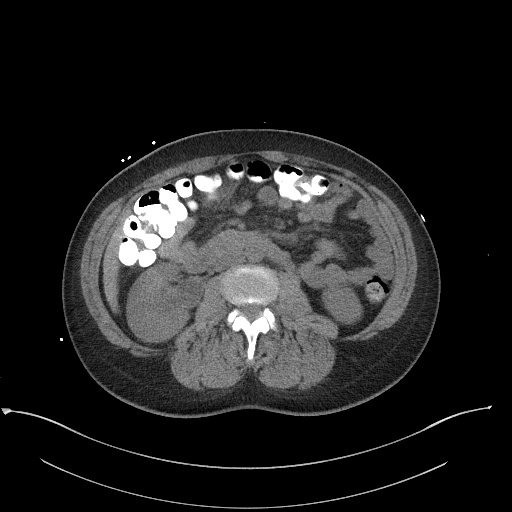
[im 55/84  bone]
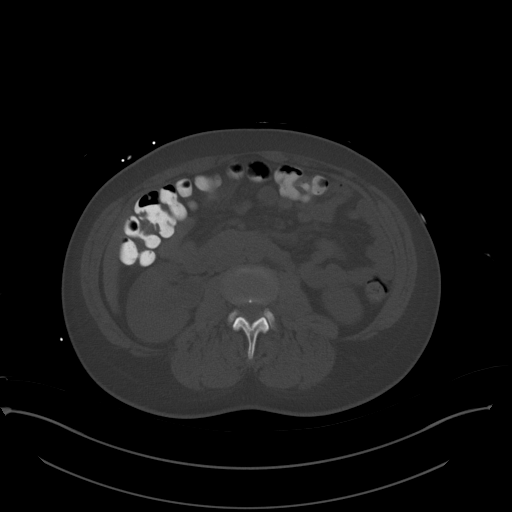
[im 61/84  soft-tissue]
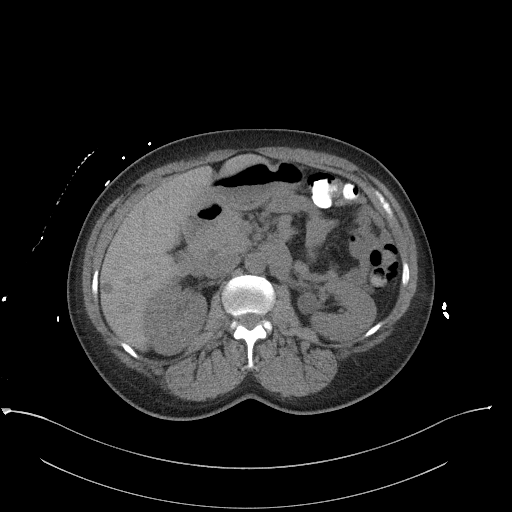
[im 68/84  soft-tissue]
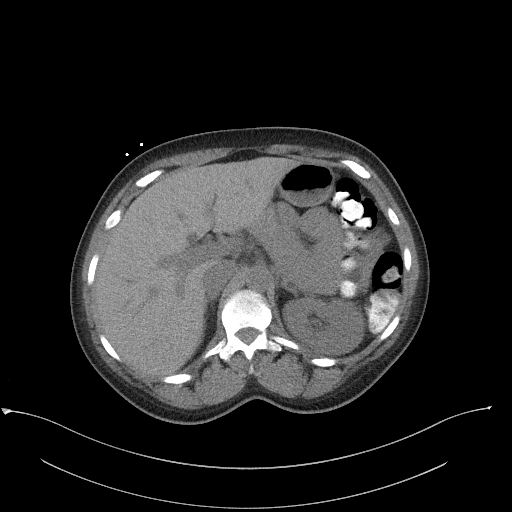
[im 74/84  soft-tissue]
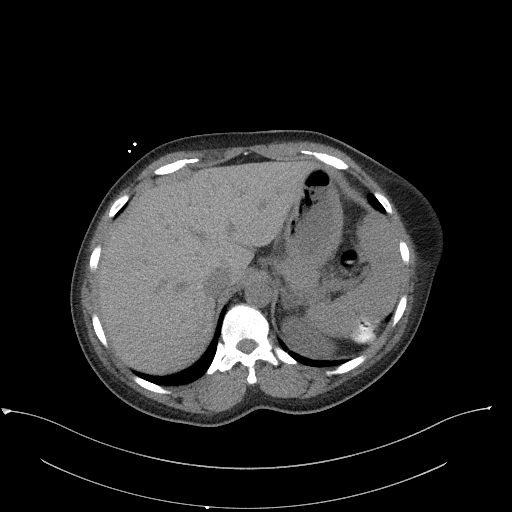
[im 80/84  soft-tissue]
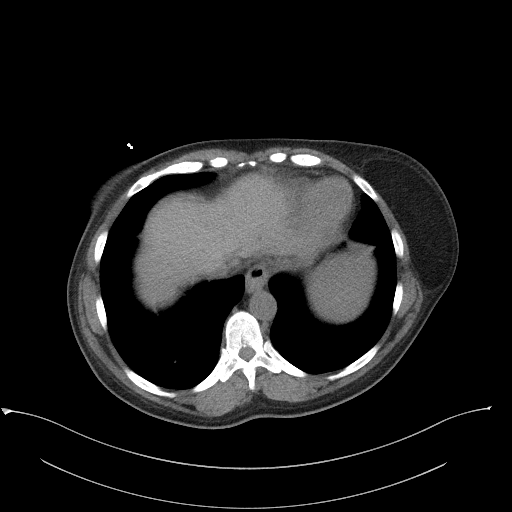

[Series 6: a/p w/o cor · coronal · non-contrast · 0.82mm/px · 3 of 151 slices shown]
[im 51/151  soft-tissue]
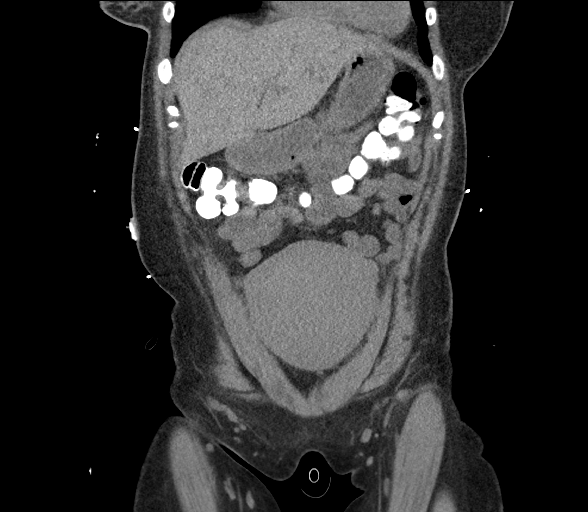
[im 67/151  soft-tissue]
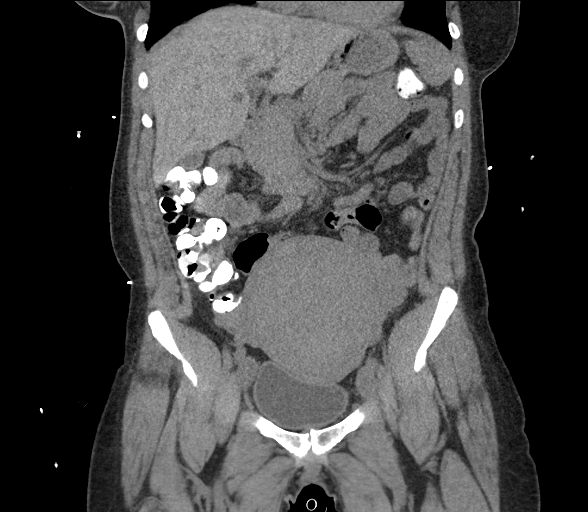
[im 84/151  soft-tissue]
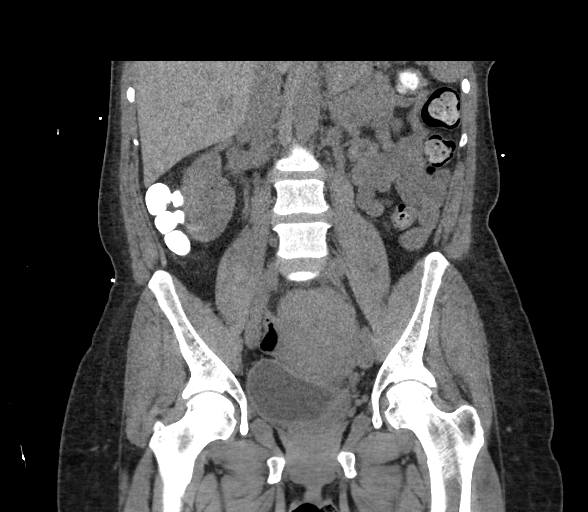

[16 of 46 positions shown; findings below may reference images not displayed]

FINDINGS: Lower chest: Lung bases demonstrate no acute consolidation or
effusion. Normal cardiac size. Incompletely visualized fat density
circumscribed lesion at the left breast.

Hepatobiliary: Subcentimeter hypodensity within the inferior right
hepatic lobe. No gallstones, gallbladder wall thickening, or biliary
dilatation.

Pancreas: Unremarkable. No pancreatic ductal dilatation or
surrounding inflammatory changes.

Spleen: Normal in size without focal abnormality.

Adrenals/Urinary Tract: Adrenal glands are normal. Mildly prominent
left renal pelvis. Moderately dilated right renal pelvis without
significant calyceal dilatation. The ureters are nonenlarged.
Questionable asymmetric wall thickening at the left posterior
bladder.

Stomach/Bowel: Stomach is within normal limits. Appendix appears
normal. No evidence of bowel wall thickening, distention, or
inflammatory changes.

Vascular/Lymphatic: No significant vascular findings are present. No
enlarged abdominal or pelvic lymph nodes.

Reproductive: Markedly enlarged uterus presumably due to fibroids.
No discrete adnexal mass

Other: Negative for free air or free fluid. Small fat containing
umbilical hernia.

Musculoskeletal: No acute or significant osseous findings.
IMPRESSION: 1. Right greater than left renal pelvis dilatation without
hydroureter or obstructing ureteral stone.
2. Enlarged uterus likely due to fibroids
3. Questionable asymmetric wall thickening/soft tissue density at
the left posterior bladder, no definite correlate on recent pelvic
ultrasound imaging though only limited views of the bladder. Could
consider correlation with urinalysis with dedicated ultrasound of
the bladder as indicated.

## 2022-12-11 ENCOUNTER — Ambulatory Visit (HOSPITAL_COMMUNITY): Payer: Self-pay | Admitting: Mental Health

## 2022-12-20 ENCOUNTER — Encounter (HOSPITAL_COMMUNITY): Payer: Self-pay

## 2022-12-20 ENCOUNTER — Ambulatory Visit (HOSPITAL_COMMUNITY)
Admission: EM | Admit: 2022-12-20 | Discharge: 2022-12-20 | Disposition: A | Discharge status: Home / Self Care | Payer: BLUE CROSS/BLUE SHIELD | Attending: Internal Medicine | Admitting: Internal Medicine

## 2022-12-20 ENCOUNTER — Ambulatory Visit (INDEPENDENT_AMBULATORY_CARE_PROVIDER_SITE_OTHER): Payer: BLUE CROSS/BLUE SHIELD

## 2022-12-20 DIAGNOSIS — R053 Chronic cough: Secondary | ICD-10-CM | POA: Diagnosis not present

## 2022-12-20 DIAGNOSIS — J029 Acute pharyngitis, unspecified: Secondary | ICD-10-CM | POA: Diagnosis not present

## 2022-12-20 DIAGNOSIS — R059 Cough, unspecified: Secondary | ICD-10-CM | POA: Diagnosis not present

## 2022-12-20 MED ORDER — PREDNISONE 20 MG PO TABS: 40.0000 mg | ORAL_TABLET | Freq: Every day | ORAL | 0 refills | Status: AC

## 2022-12-20 MED ORDER — BENZONATATE 100 MG PO CAPS: 100.0000 mg | ORAL_CAPSULE | Freq: Three times a day (TID) | ORAL | 0 refills | Status: DC | PRN

## 2022-12-20 NOTE — ED Triage Notes (Signed)
Pt c/o cough, sore throat, and loss of voice since Christmas. Denies taking any meds.

## 2022-12-20 NOTE — ED Provider Notes (Addendum)
[facility]    CSN: [id number] Arrival date & time: 12/20/22  7322      History   Chief Complaint Chief Complaint  Patient presents with   Cough    HPI Anna [name] is a 47 y.o. female.   Patient presents with persistent cough, sore throat, hoarseness that has been present for several weeks and started since around Christmas time.  Reports cough is dry and intermittent.  Denies any associated upper respiratory symptoms or fever.  Denies any known sick contacts.  Denies chest pain, shortness of breath, nausea, vomiting, diarrhea, abdominal pain.  Denies history of asthma or COPD.  She has not taken any medications to help alleviate symptoms.  Patient also has mildly elevated blood pressure.  She denies history of hypertension or the need for hypertension medications in the past.  Denies headache, dizziness, blurred vision.  Patient reports that she takes her blood pressure at home routinely and is typically 025 systolic. Denies that she takes any daily medications.    Cough   Past Medical History:  Diagnosis Date   Anemia    Hypertension    only elevated at MD office per pt. no meds    Patient Active Problem List   Diagnosis Date Noted   MDD (major depressive disorder), recurrent severe, without psychosis ([place]) 05/30/2022   History of total abdominal hysterectomy 06/20/2021   History of left oophorectomy 06/20/2021   Post-operative state 05/22/2021   Visit for routine gyn exam 05/09/2021   Symptomatic anemia 04/20/2021   Microscopic hematuria 04/20/2021    Past Surgical History:  Procedure Laterality Date   FRACTURE SURGERY  right ankle   HYSTERECTOMY ABDOMINAL WITH SALPINGECTOMY Bilateral 05/22/2021   Procedure: HYSTERECTOMY ABDOMINAL WITH SALPINGECTOMY AND LEFT OOPHORECTOMY;  Surgeon: [name], MD;  Location: [facility];  Service: Gynecology;  Laterality: Bilateral;  TAP BLOCK    OB History     Gravida  6   Para  3   Term  3   Preterm  0   AB   3   Living  3      SAB  0   IAB  3   Ectopic  0   Multiple  0   Live Births  3            Home Medications    Prior to Admission medications   Medication Sig Start Date End Date Taking? Authorizing Provider  benzonatate (TESSALON) 100 MG capsule Take 1 capsule (100 mg total) by mouth every 8 (eight) hours as needed for cough. 12/20/22  Yes Arael Piccione, Hildred Alamin E, FNP  predniSONE (DELTASONE) 20 MG tablet Take 2 tablets (40 mg total) by mouth daily for 5 days. 12/20/22 12/25/22 Yes Nyxon Strupp, Michele Rockers, FNP  cloNIDine (CATAPRES) 0.1 MG tablet Take 1 tablet (0.1 mg total) by mouth 3 (three) times daily as needed (agitation and restlessness). Patient not taking: Reported on 12/20/2022 09/12/22   Scot Jun, FNP  sertraline (ZOLOFT) 50 MG tablet Take 1 tablet (50 mg total) by mouth daily. Patient not taking: Reported on 12/20/2022 07/03/22   Gildardo Pounds, NP  traZODone (DESYREL) 150 MG tablet Take 1 tablet (150 mg total) by mouth at bedtime as needed for sleep. Patient not taking: Reported on 12/20/2022 07/03/22   Gildardo Pounds, NP    Family History Family History  Problem Relation Age of Onset   Healthy Mother    Healthy Father     Social History Social History  Tobacco Use   Smoking status: Some Days    Types: Cigarettes   Smokeless tobacco: Never   Tobacco comments:    pt states quit 1-2 years ago   Vaping Use   Vaping Use: Never used  Substance Use Topics   Alcohol use: Yes    Comment: occassionally, not often   Drug use: Yes    Types: Cocaine, Marijuana    Comment: occasional cocaine use per availability; cannabis use 2x weekly     Allergies   Latex and Peanut-containing drug products   Review of Systems Review of Systems Per HPI  Physical Exam Triage Vital Signs ED Triage Vitals [12/20/22 0825]  Enc Vitals Group     BP (!) 168/106     Pulse Rate 79     Resp 18     Temp 98.2 F (36.8 C)     Temp Source Oral     SpO2 100 %     Weight       Height      Head Circumference      Peak Flow      Pain Score 0     Pain Loc      Pain Edu?      Excl. in [place]?    No data found.  Updated Vital Signs BP (!) 149/100 (BP Location: Left Arm)   Pulse 79   Temp 98.2 F (36.8 C) (Oral)   Resp 18   LMP 04/12/2021 (Approximate)   SpO2 99%   Visual Acuity Right Eye Distance:   Left Eye Distance:   Bilateral Distance:    Right Eye Near:   Left Eye Near:    Bilateral Near:     Physical Exam Constitutional:      General: She is not in acute distress.    Appearance: Normal appearance. She is not toxic-appearing or diaphoretic.  HENT:     Head: Normocephalic and atraumatic.     Right Ear: Ear canal normal. No drainage, swelling or tenderness. A middle ear effusion is present. Tympanic membrane is not perforated, erythematous or bulging.     Left Ear: Ear canal normal. No drainage, swelling or tenderness. A middle ear effusion is present. Tympanic membrane is not perforated, erythematous or bulging.     Nose: Nose normal. No congestion.     Mouth/Throat:     Mouth: Mucous membranes are moist.     Pharynx: No posterior oropharyngeal erythema.  Eyes:     Extraocular Movements: Extraocular movements intact.     Conjunctiva/sclera: Conjunctivae normal.     Pupils: Pupils are equal, round, and reactive to light.  Cardiovascular:     Rate and Rhythm: Normal rate and regular rhythm.     Pulses: Normal pulses.     Heart sounds: Normal heart sounds.  Pulmonary:     Effort: Pulmonary effort is normal. No respiratory distress.     Breath sounds: Normal breath sounds. No stridor. No wheezing, rhonchi or rales.  Abdominal:     General: Abdomen is flat. Bowel sounds are normal.     Palpations: Abdomen is soft.  Musculoskeletal:        General: Normal range of motion.     Cervical back: Normal range of motion.  Skin:    General: Skin is warm and dry.  Neurological:     General: No focal deficit present.     Mental Status: She is alert  and oriented to person, place, and time. Mental status is at baseline.  Cranial Nerves: Cranial nerves 2-12 are intact.     Sensory: Sensation is intact.     Motor: Motor function is intact.     Coordination: Coordination is intact.     Gait: Gait is intact.  Psychiatric:        Mood and Affect: Mood normal.        Behavior: Behavior normal.      UC Treatments / Results  Labs (all labs ordered are listed, but only abnormal results are displayed) Labs Reviewed - No data to display  EKG   Radiology DG Chest 2 View  Result Date: 12/20/2022 CLINICAL DATA:  47 year old female with history of cough and sore throat. EXAM: CHEST - 2 VIEW COMPARISON:  Chest x-ray 03/09/2016. FINDINGS: Lung volumes are normal. No consolidative airspace disease. No pleural effusions. No pneumothorax. No pulmonary nodule or mass noted. Pulmonary vasculature and the cardiomediastinal silhouette are within normal limits. IMPRESSION: No radiographic evidence of acute cardiopulmonary disease. Electronically Signed   By: Vinnie Langton M.D.   On: 12/20/2022 09:29    Procedures Procedures (including critical care time)  Medications Ordered in UC Medications - No data to display  Initial Impression / Assessment and Plan / UC Course  I have reviewed the triage vital signs and the nursing notes.  Pertinent labs & imaging results that were available during my care of the patient were reviewed by me and considered in my medical decision making (see chart for details).     Chest x-ray negative for any acute cardiopulmonary process.  Suspect persistent viral cough versus acute viral bronchitis.  Will treat with steroid burst as patient appears to have taken this before and tolerated well.  No obvious contraindication to steroid therapy noted patient's history.  Benzonatate prescribed to take as needed for cough as well.  Patient concerned for voice hoarseness which is not noted on physical exam.  Encouraged  supportive care including humidifier, voice rest, fluids.  No suspicion for bacteria or need for antibiotics on physical exam.  Suspect sore throat is due to viral illness.  No indication for strep testing on physical exam so this was deferred.  Patient advised to follow-up if symptoms persist or worsen.  Recheck of blood pressure was much improved.  Patient reports normal blood pressures at home.  Advised patient to continue to monitor blood pressure at home and follow-up with PCP or urgent care if remains elevated.  No signs of hypertensive urgency on exam as patient is asymptomatic regarding blood pressure.  Therefore, do not think that emergent evaluation is necessary.  Patient verbalized understanding and was agreeable with plan. Final Clinical Impressions(s) / UC Diagnoses   Final diagnoses:  Persistent cough  Viral pharyngitis     Discharge Instructions      Your chest x-ray was normal.  Suspect persistent viral cough possibly due to bronchitis.  I have prescribed prednisone to decrease this inflammation.  A cough medication has also been prescribed.  Please use a humidifier, voice rest, plenty of water to help alleviate voice hoarseness.  Follow-up if any symptoms persist or worsen.     ED Prescriptions     Medication Sig Dispense Auth. Provider   predniSONE (DELTASONE) 20 MG tablet Take 2 tablets (40 mg total) by mouth daily for 5 days. 10 tablet [place], Pinch E, [place]   benzonatate (TESSALON) 100 MG capsule Take 1 capsule (100 mg total) by mouth every 8 (eight) hours as needed for cough. 21 capsule [place], Michele Rockers, [place]  PDMP not reviewed this encounter.   Teodora Medici, [place] 12/20/22 [place], [place], FNP 12/20/22 0959    Teodora Medici, FNP 12/20/22 1000

## 2022-12-20 NOTE — Discharge Instructions (Signed)
Your chest x-ray was normal.  Suspect persistent viral cough possibly due to bronchitis.  I have prescribed prednisone to decrease this inflammation.  A cough medication has also been prescribed.  Please use a humidifier, voice rest, plenty of water to help alleviate voice hoarseness.  Follow-up if any symptoms persist or worsen.

## 2023-02-11 ENCOUNTER — Ambulatory Visit (INDEPENDENT_AMBULATORY_CARE_PROVIDER_SITE_OTHER): Payer: BLUE CROSS/BLUE SHIELD | Admitting: Advanced Practice Midwife

## 2023-02-11 ENCOUNTER — Encounter: Payer: Self-pay | Admitting: Advanced Practice Midwife

## 2023-02-11 ENCOUNTER — Other Ambulatory Visit: Payer: Self-pay

## 2023-02-11 VITALS — BP 140/104 | HR 84 | Wt 178.0 lb

## 2023-02-11 DIAGNOSIS — Z0000 Encounter for general adult medical examination without abnormal findings: Secondary | ICD-10-CM | POA: Diagnosis not present
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
## 2023-02-20 ENCOUNTER — Ambulatory Visit: Payer: Self-pay

## 2023-02-20 ENCOUNTER — Other Ambulatory Visit: Payer: Self-pay

## 2023-02-20 NOTE — Telephone Encounter (Signed)
  Chief Complaint: anxiety Symptoms: increased anxiety, difficulty sleeping, chest pain and mind racing where she can't sleep Frequency: few weeks  Pertinent Negatives: Patient denies SI currently but has had thought previously  Disposition: [] ED /[x] Urgent Care (no appt availability in office) / [] Appointment(In office/virtual)/ []  [facility]/ [] Home Care/ [] Refused Recommended Disposition /[]  Mobile Bus/ []  Follow-up with PCP Additional Notes: pt states she is needing something for anxiety because she hasn't been able to do anything lately. Pt hasn't taken trazodone in a while for sleep. No appts with practice and pt doesn't have access to mychart to do virtual UC, reached out to [facility], RN but no response. Scheduled UC appt tomorrow at 0930. Care advice given and pt verbalized understanding.   Reason for Disposition . MODERATE anxiety (e.g., persistent or frequent anxiety symptoms; interferes with sleep, school, or work)  Answer Assessment - Initial Assessment Questions 1. CONCERN: "Did anything happen that prompted you to call today?"      Difficulty sleeping and trouble sleeping 2. ANXIETY SYMPTOMS: "Can you describe how you (your loved one; patient) have been feeling?" (e.g., tense, restless, panicky, anxious, keyed up, overwhelmed, sense of impending doom).      Tearful, anxious, overwhelmed  3. ONSET: "How long have you been feeling this way?" (e.g., hours, days, weeks)     Few weeks  4. SEVERITY: "How would you rate the level of anxiety?" (e.g., 0 - 10; or mild, moderate, severe).     Mild to moderate  5. FUNCTIONAL IMPAIRMENT: "How have these feelings affected your ability to do daily activities?" "Have you had more difficulty than usual doing your normal daily activities?" (e.g., getting better, same, worse; self-care, school, work, interactions)     Impacts daily living   7. RISK OF HARM - SUICIDAL IDEATION: "Do you ever have thoughts of hurting or  killing yourself?" If Yes, ask:  "Do you have these feelings now?" "Do you have a plan on how you would do this?"     Had thoughts previously this week  8. TREATMENT:  "What has been done so far to treat this anxiety?" (e.g., medicines, relaxation strategies). "What has helped?"     Nothing helping  12. OTHER SYMPTOMS: "Do you have any other symptoms?" (e.g., feeling depressed, trouble concentrating, trouble sleeping, trouble breathing, palpitations or fast heartbeat, chest pain, sweating, nausea, or diarrhea)       Chest pain 2-3 mins, mind resting  Protocols used: Anxiety and Panic Attack-A-AH

## 2023-02-20 NOTE — Telephone Encounter (Signed)
Called pt back, unable to LVM d/t "call cannot be completed at this time." Was calling pt back to advise that Cass[place], RN had reached out to be that pt needs to call Beatrice Lecher, [place] for assistance with anxiety as well.

## 2023-02-21 ENCOUNTER — Ambulatory Visit (HOSPITAL_COMMUNITY): Payer: Self-pay

## 2023-02-25 ENCOUNTER — Other Ambulatory Visit: Payer: Self-pay

## 2023-03-26 ENCOUNTER — Inpatient Hospital Stay: Admission: RE | Admit: 2023-03-26 | Payer: BLUE CROSS/BLUE SHIELD | Source: Ambulatory Visit

## 2023-04-01 ENCOUNTER — Ambulatory Visit: Payer: BLUE CROSS/BLUE SHIELD

## 2023-04-10 ENCOUNTER — Encounter: Payer: Self-pay | Admitting: Physician Assistant

## 2023-05-05 ENCOUNTER — Emergency Department (HOSPITAL_COMMUNITY)
Admission: EM | Admit: 2023-05-05 | Discharge: 2023-05-06 | Disposition: A | Discharge status: Home / Self Care | Payer: BLUE CROSS/BLUE SHIELD | Attending: Emergency Medicine | Admitting: Emergency Medicine

## 2023-05-05 ENCOUNTER — Other Ambulatory Visit: Payer: Self-pay

## 2023-05-05 ENCOUNTER — Encounter (HOSPITAL_COMMUNITY): Payer: Self-pay | Admitting: Emergency Medicine

## 2023-05-05 ENCOUNTER — Emergency Department (HOSPITAL_COMMUNITY): Payer: BLUE CROSS/BLUE SHIELD

## 2023-05-05 DIAGNOSIS — M79662 Pain in left lower leg: Secondary | ICD-10-CM | POA: Insufficient documentation

## 2023-05-05 DIAGNOSIS — M79661 Pain in right lower leg: Secondary | ICD-10-CM | POA: Diagnosis not present

## 2023-05-05 DIAGNOSIS — R600 Localized edema: Secondary | ICD-10-CM | POA: Insufficient documentation

## 2023-05-05 DIAGNOSIS — Z91040 Latex allergy status: Secondary | ICD-10-CM | POA: Diagnosis not present

## 2023-05-05 DIAGNOSIS — M79602 Pain in left arm: Secondary | ICD-10-CM | POA: Insufficient documentation

## 2023-05-05 DIAGNOSIS — M79601 Pain in right arm: Secondary | ICD-10-CM | POA: Diagnosis present

## 2023-05-05 DIAGNOSIS — Z91010 Allergy to peanuts: Secondary | ICD-10-CM | POA: Diagnosis not present

## 2023-05-05 DIAGNOSIS — R000 Tachycardia, unspecified: Secondary | ICD-10-CM | POA: Insufficient documentation

## 2023-05-05 DIAGNOSIS — M7918 Myalgia, other site: Secondary | ICD-10-CM

## 2023-05-05 LAB — CBC WITH DIFFERENTIAL/PLATELET
Abs Immature Granulocytes: 0.01 10*3/uL (ref 0.00–0.07)
Basophils Absolute: 0 10*3/uL (ref 0.0–0.1)
Basophils Relative: 1 %
Eosinophils Absolute: 0.1 10*3/uL (ref 0.0–0.5)
Eosinophils Relative: 2 %
HCT: 36.5 % (ref 36.0–46.0)
Hemoglobin: 11.8 g/dL — ABNORMAL LOW (ref 12.0–15.0)
Immature Granulocytes: 0 %
Lymphocytes Relative: 18 %
Lymphs Abs: 1.2 10*3/uL (ref 0.7–4.0)
MCH: 27.1 pg (ref 26.0–34.0)
MCHC: 32.3 g/dL (ref 30.0–36.0)
MCV: 83.9 fL (ref 80.0–100.0)
Monocytes Absolute: 0.6 10*3/uL (ref 0.1–1.0)
Monocytes Relative: 9 %
Neutro Abs: 4.7 10*3/uL (ref 1.7–7.7)
Neutrophils Relative %: 70 %
Platelets: 459 10*3/uL — ABNORMAL HIGH (ref 150–400)
RBC: 4.35 MIL/uL (ref 3.87–5.11)
RDW: 16.8 % — ABNORMAL HIGH (ref 11.5–15.5)
WBC: 6.6 10*3/uL (ref 4.0–10.5)
nRBC: 0 % (ref 0.0–0.2)

## 2023-05-05 LAB — COMPREHENSIVE METABOLIC PANEL
ALT: 12 U/L (ref 0–44)
AST: 19 U/L (ref 15–41)
Albumin: 3.2 g/dL — ABNORMAL LOW (ref 3.5–5.0)
Alkaline Phosphatase: 83 U/L (ref 38–126)
Anion gap: 14 (ref 5–15)
BUN: 5 mg/dL — ABNORMAL LOW (ref 6–20)
CO2: 27 mmol/L (ref 22–32)
Calcium: 8.9 mg/dL (ref 8.9–10.3)
Chloride: 98 mmol/L (ref 98–111)
Creatinine, Ser: 0.78 mg/dL (ref 0.44–1.00)
GFR, Estimated: >60 mL/min (ref >60)
Glucose, Bld: 104 mg/dL — ABNORMAL HIGH (ref 70–99)
Potassium: 2.9 mmol/L — ABNORMAL LOW (ref 3.5–5.1)
Sodium: 139 mmol/L (ref 135–145)
Total Bilirubin: 0.6 mg/dL (ref 0.3–1.2)
Total Protein: 7.6 g/dL (ref 6.5–8.1)

## 2023-05-05 LAB — BRAIN NATRIURETIC PEPTIDE: B Natriuretic Peptide: 36.6 pg/mL (ref 0.0–100.0)

## 2023-05-05 MED ORDER — OXYCODONE-ACETAMINOPHEN 5-325 MG PO TABS
2.0000 | ORAL_TABLET | Freq: Once | ORAL | Status: AC
  Administered 2023-05-05: 2 via ORAL
  Filled 2023-05-05: qty 2

## 2023-05-05 MED ORDER — POTASSIUM CHLORIDE CRYS ER 20 MEQ PO TBCR
40.0000 meq | EXTENDED_RELEASE_TABLET | Freq: Once | ORAL | Status: AC
  Administered 2023-05-05: 40 meq via ORAL
  Filled 2023-05-05: qty 2

## 2023-05-05 NOTE — ED Notes (Signed)
Pt called for triage repeatedly, no response  

## 2023-05-05 NOTE — ED Provider Notes (Signed)
[place] EMERGENCY DEPARTMENT AT [facility] Provider Note   CSN: [id number] Arrival date & time: 05/05/23  1844     History Chief Complaint  Patient presents with   Arm Pain    Anna [name] is a 47 y.o. female.  Patient presents emergency department complaints of bilateral upper extremity and lower extremity pain.  Reports this pain is been present for the last 2 weeks.  No prior history of pain similar to this.  Denies any autoimmune conditions such as rheumatoid arthritis, lupus.  No history of sickle cell disease.  Reports has been try to manage symptoms at home with over-the-counter pain medications without significant improvement.  Patient is concerned as she feels that she has noted some swelling in her extremities as well as some erythema.  Denies IV drug use.   Arm Pain       Home Medications Prior to Admission medications   Medication Sig Start Date End Date Taking? Authorizing Provider  sertraline (ZOLOFT) 50 MG tablet Take 1 tablet (50 mg total) by mouth daily. 07/03/22  Yes Claiborne Rigg, NP  traZODone (DESYREL) 150 MG tablet Take 1 tablet (150 mg total) by mouth at bedtime as needed for sleep. 07/03/22  Yes Claiborne Rigg, NP  benzonatate (TESSALON) 100 MG capsule Take 1 capsule (100 mg total) by mouth every 8 (eight) hours as needed for cough. Patient not taking: Reported on 02/11/2023 12/20/22   Gustavus Bryant, FNP  cloNIDine (CATAPRES) 0.1 MG tablet Take 1 tablet (0.1 mg total) by mouth 3 (three) times daily as needed (agitation and restlessness). Patient not taking: Reported on 12/20/2022 09/12/22   Bing Neighbors, NP      Allergies    Latex and Peanut-containing drug products    Review of Systems   Review of Systems  Cardiovascular:  Positive for leg swelling.  All other systems reviewed and are negative.   Physical Exam Updated Vital Signs BP 126/71 (BP Location: Left Arm)   Pulse 87   Temp 98.6 F (37 C) (Oral)   Resp 15   Wt 80.7  kg   LMP 04/12/2021 (Approximate)   SpO2 99%   BMI 27.86 kg/m  Physical Exam Vitals and nursing note reviewed.  Constitutional:      General: She is not in acute distress.    Appearance: She is well-developed.  HENT:     Head: Normocephalic and atraumatic.  Eyes:     Conjunctiva/sclera: Conjunctivae normal.  Cardiovascular:     Rate and Rhythm: Regular rhythm. Tachycardia present.     Heart sounds: No murmur heard. Pulmonary:     Effort: Pulmonary effort is normal. No respiratory distress.     Breath sounds: Normal breath sounds.  Abdominal:     Palpations: Abdomen is soft.     Tenderness: There is no abdominal tenderness.  Musculoskeletal:        General: Tenderness present. No swelling or deformity. Normal range of motion.     Cervical back: Neck supple.     Right lower leg: No edema.     Left lower leg: No edema.     Comments: Tenderness in upper and lower extremities. No increased firmness in skin. Has full ROM, but reports pain with ROM testing.  Skin:    General: Skin is warm and dry.     Capillary Refill: Capillary refill takes less than 2 seconds.  Neurological:     Mental Status: She is alert.  Psychiatric:  Mood and Affect: Mood normal.     ED Results / Procedures / Treatments   Labs (all labs ordered are listed, but only abnormal results are displayed) Labs Reviewed  CBC WITH DIFFERENTIAL/PLATELET - Abnormal; Notable for the following components:      Result Value   Hemoglobin 11.8 (*)    RDW 16.8 (*)    Platelets 459 (*)    All other components within normal limits  COMPREHENSIVE METABOLIC PANEL - Abnormal; Notable for the following components:   Potassium 2.9 (*)    Glucose, Bld 104 (*)    BUN 5 (*)    Albumin 3.2 (*)    All other components within normal limits  BRAIN NATRIURETIC PEPTIDE  CK    EKG None  Radiology DG Wrist Complete Right  Result Date: 05/05/2023 CLINICAL DATA:  Right wrist pain EXAM: RIGHT WRIST - COMPLETE 3+ VIEW  COMPARISON:  None Available. FINDINGS: There is no evidence of fracture or dislocation. There is no evidence of arthropathy or other focal bone abnormality. Soft tissues are unremarkable. IMPRESSION: Negative. Electronically Signed   By: Larose Hires D.O.   On: 05/05/2023 19:35    Procedures Procedures   Medications Ordered in ED Medications  oxyCODONE-acetaminophen (PERCOCET/ROXICET) 5-325 MG per tablet 2 tablet (2 tablets Oral Given 05/05/23 2050)  potassium chloride SA (KLOR-CON M) CR tablet 40 mEq (40 mEq Oral Given 05/05/23 2221)    ED Course/ Medical Decision Making/ A&P Clinical Course as of 05/06/23 1329  Mon May 05, 2023  2357 Lab CK actually not added on >1 hour ago when lab said it was. Just now being collected. [HN]    Clinical Course User Index [HN] Loetta Rough, MD                           Medical Decision Making Amount and/or Complexity of Data Reviewed Labs: ordered. Radiology: ordered.  Risk Prescription drug management.   This patient presents to the ED for concern of arm pain.  Differential diagnosis includes DVT, sickle cell crisis, ischemic limb, rhabdomyolysis   Lab Tests:  I Ordered, and personally interpreted labs.  The pertinent results include: CBC unremarkable, CMP with mild hypokalemia, BNP normal, CK normal   Imaging Studies ordered:  I ordered imaging studies including x-ray of right wrist I independently visualized and interpreted imaging which showed no obvious bony deformities or abnormalities of the soft tissues I agree with the radiologist interpretation   Medicines ordered and prescription drug management:  I ordered medication including Percocet, potassium for pain, hypokalemia Reevaluation of the patient after these medicines showed that the patient improved I have reviewed the patients home medicines and have made adjustments as needed   Problem List / ED Course:  Patient presents emergency department complaints of  bilateral arm and leg swelling.  Reports this has been ongoing for the last 2 weeks.  No prior history of pain similar to this.  No history of any autoimmune conditions, sickle cell disease, strokes or clots.  Reports she has tried managing symptoms at home with pain medication without significant improvement in symptoms. Lab workup initiated with basic labs including CBC, CMP, BNP.  X-ray imaging did not appear to show any acute abnormalities in bony or soft tissue's. Labs largely unremarkable with only mild hypokalemia noted.  Potassium repletion given.  No acute signs of infection with no elevation or significant decrease in white blood count, BNP normal not showing signs of  fluid overload.  Patient had symptomatic improvement with doses of Percocet. Was contacted by nursing that patient was requesting pain medicine once again although I had assessed patient approximately 5 minutes prior and patient was asleep at that time resting comfortably.  10:25PM Care of Dkayla Mcninch transferred to Dr. Jearld Fenton at the end of my shift as the patient will require reassessment once labs/imaging have resulted. Patient presentation, ED course, and plan of care discussed with review of all pertinent labs and imaging. Please see his/her note for further details regarding further ED course and disposition. Plan at time of handoff is reassessed patient for pain control.  CK added on for concerns about potential rhabdomyolysis and evaluate after results. This may be altered or completely changed at the discretion of the oncoming team pending results of further workup.    Final Clinical Impression(s) / ED Diagnoses Final diagnoses:  Musculoskeletal pain    Rx / DC Orders ED Discharge Orders     None         Smitty Knudsen, PA-C 05/06/23 1329    Loetta Rough, MD 05/15/23 1154

## 2023-05-05 NOTE — ED Provider Notes (Signed)
10:58 PM Assumed care of patient from off-going team. For more details, please see note from same day.  In brief, this is a 47 y.o. female who presents with bilateral upper and lower extremity pain and swelling. No injuries or swelling observed on exam. W/u reassuring, BNP neg.   Plan/Dispo at time of sign-out & ED Course since sign-out: [ ]  CK, reassess  BP 108/64 (BP Location: Left Arm)   Pulse 96   Temp 98.4 F (36.9 C) (Oral)   Resp 19   Wt 80.7 kg   LMP 04/12/2021 (Approximate)   SpO2 100%   BMI 27.86 kg/m    ED Course:   Clinical Course as of 05/05/23 2358  Mon May 05, 2023  2357 Lab CK actually not added on >1 hour ago when lab said it was. Just now being collected. [HN]    Clinical Course User Index [HN] Loetta Rough, MD    Dispo: Patient is signed out to the oncoming ED physician who is made aware of her history, presentation, exam, workup, and plan. Plan is to obtain CK and likely DC if wnl.   ------------------------------- Vivi Barrack, MD Emergency Medicine  This note was created using dictation software, which may contain spelling or grammatical errors.   Loetta Rough, MD 05/05/23 [phone]

## 2023-05-05 NOTE — ED Triage Notes (Signed)
Pt in with reported bilateral arm and ankle pain and swelling x 2 wks. She states R forearm has been worse today, and is now worse in R wrist, some redness and swelling noted. Tearful in triage, R wrist tender to touch. Denies any injuries

## 2023-05-06 LAB — CK: Total CK: 195 U/L (ref 38–234)

## 2023-05-06 NOTE — ED Notes (Signed)
Patient verbalizes understanding of discharge instructions. Opportunity for questioning and answers were provided. Armband removed by staff, pt discharged from ED. Pt ambulatory to ED waiting room with steady gait.  

## 2023-05-06 NOTE — ED Provider Notes (Signed)
  Provider Note MRN:  [id number]  Arrival date & time: 05/06/23    ED Course and Medical Decision Making  Assumed care from Dr. Jearld Fenton at shift change.  Myalgias with reassuring workup, awaiting CK which has resulted and is also reassuring.  Appropriate for discharge.  Procedures  Final Clinical Impressions(s) / ED Diagnoses     ICD-10-CM   1. Musculoskeletal pain  M79.18       ED Discharge Orders     None         Discharge Instructions      You were evaluated in the Emergency Department and after careful evaluation, we did not find any emergent condition requiring admission or further testing in the hospital.  Your exam/testing today was overall reassuring.  Please return to the Emergency Department if you experience any worsening of your condition.  Thank you for allowing [place] to be a part of your care.     Elmer Sow. Pilar Plate, MD [facility] Emergency Medicine [facility] [email or web address]    Sabas Sous, MD 05/06/23 [phone]

## 2023-05-06 NOTE — Discharge Instructions (Addendum)
You were evaluated in the Emergency Department and after careful evaluation, we did not find any emergent condition requiring admission or further testing in the hospital.  Your exam/testing today was overall reassuring.  Please return to the Emergency Department if you experience any worsening of your condition.  Thank you for allowing us to be a part of your care.  

## 2023-05-06 NOTE — ED Notes (Signed)
Contacted lab about CK not in process, states they do not have sample. Recollect sent to lab.

## 2023-05-07 ENCOUNTER — Ambulatory Visit: Payer: Self-pay | Admitting: *Deleted

## 2023-05-07 NOTE — Telephone Encounter (Signed)
Spoke with patient . Verified name & DOB   Patient is agreeable to go to [facility] on 06/052024. Advised that id S/S worsen to go to [facility]

## 2023-05-07 NOTE — Telephone Encounter (Signed)
Summary: swelling in both legs   Patient called stated she is having swelling both her legs to the point of not being able to walk. She also has pain hands and full body pain. Please call for f/u       Chief Complaint: bilateral leg swelling, joint pain Symptoms: bilateral ankle swelling pain with walking x 2 weeks. Can walk. Ankles, knees, hands , wrists and full body aches. Reports being seen in ED 05/05/23 Frequency: 2 weeks  Pertinent Negatives: Patient denies chest pain no difficulty breathing no fever Disposition: [] ED /[] Urgent Care (no appt availability in office) / [] Appointment(In office/virtual)/ []  [place] [facility]/ [] Home Care/ [x] Refused Recommended Disposition /[] [place] [facility]/ []  Follow-up with PCP Additional Notes:   No available OV until 06/06/23. Recommended mobile bus or UC. Patient reports she does not have transportation today. Please advise . Patient would like call back.       Reason for Disposition  [1] MODERATE leg swelling (e.g., swelling extends up to knees) AND [2] new-onset or worsening  Answer Assessment - Initial Assessment Questions 1. ONSET: "When did the swelling start?" (e.g., minutes, hours, days)     *No Answer* 2. LOCATION: "What part of the leg is swollen?"  "Are both legs swollen or just one leg?"     *No Answer* 3. SEVERITY: "How bad is the swelling?" (e.g., localized; mild, moderate, severe)   - Localized: Small area of swelling localized to one leg.   - MILD pedal edema: Swelling limited to foot and ankle, pitting edema < 1/4 inch (6 mm) deep, rest and elevation eliminate most or all swelling.   - MODERATE edema: Swelling of lower leg to knee, pitting edema > 1/4 inch (6 mm) deep, rest and elevation only partially reduce swelling.   - SEVERE edema: Swelling extends above knee, facial or hand swelling present.      *No Answer* 4. REDNESS: "Does the swelling look red or infected?"     Right wrist redness when going to na 5.  PAIN: "Is the swelling painful to touch?" If Yes, ask: "How painful is it?"   (Scale 1-10; mild, moderate or severe)     *No Answer* 6. FEVER: "Do you have a fever?" If Yes, ask: "What is it, how was it measured, and when did it start?"      *No Answer* 7. CAUSE: "What do you think is causing the leg swelling?"     Not sure  8. MEDICAL HISTORY: "Do you have a history of blood clots (e.g., DVT), cancer, heart failure, kidney disease, or liver failure?"     No  9. RECURRENT SYMPTOM: "Have you had leg swelling before?" If Yes, ask: "When was the last time?" "What happened that time?"     Yes ,  10. OTHER SYMPTOMS: "Do you have any other symptoms?" (e.g., chest pain, difficulty breathing)       No  11. PREGNANCY: "Is there any chance you are pregnant?" "When was your last menstrual period?"       *No Answer*  Protocols used: Leg Swelling and Edema-A-AH

## 2023-06-05 ENCOUNTER — Ambulatory Visit: Payer: Self-pay

## 2023-06-05 NOTE — Telephone Encounter (Signed)
Noted  

## 2023-06-05 NOTE — Telephone Encounter (Signed)
Message from [place] sent at 06/05/2023  3:56 PM EDT  Summary: swelling in legs   Patient said she has swelling in both her legs and has been like this for over a month now. Its starting leave bruises on her legs and its painful          Chief Complaint: bialteral knne and ankle swelling  Symptoms: bilateral medial ankle swelling and bruising. Feels a knot right calf Frequency: 1 month Pertinent Negatives: Patient denies chest pain, difficulty breathing, fever, headache Disposition: [x] ED /[] Urgent Care (no appt availability in office) / [] Appointment(In office/virtual)/ []  [place] [facility]/ [] Home Care/ [] Refused Recommended Disposition /[] [place] [facility]/ []  Follow-up with PCP Additional Notes: advised to go to ED for potential DVT doppler- pt stated for a month she has had joint swelling in different parts of her body. She has pain to her knees and ankles. She feels like her whole body hurts.   Reason for Disposition  [1] Thigh, calf, or ankle swelling AND [2] bilateral AND [3] 1 side is more swollen    Swelling is the same size - feels a knot  Answer Assessment - Initial Assessment Questions 1. ONSET: "When did the swelling start?" (e.g., minutes, hours, days)     1 month  2. LOCATION: "What part of the leg is swollen?"  "Are both legs swollen or just one leg?"     Knee and ankle swelling 3. SEVERITY: "How bad is the swelling?" (e.g., localized; mild, moderate, severe)   - Localized: Small area of swelling localized to one leg.   - MILD pedal edema: Swelling limited to foot and ankle, pitting edema < 1/4 inch (6 mm) deep, rest and elevation eliminate most or all swelling.   - MODERATE edema: Swelling of lower leg to knee, pitting edema > 1/4 inch (6 mm) deep, rest and elevation only partially reduce swelling.   - SEVERE edema: Swelling extends above knee, facial or hand swelling present.      Mild with bilateral medial bruising 4. REDNESS: "Does the swelling  look red or infected?"     Noo bruising only 5. PAIN: "Is the swelling painful to touch?" If Yes, ask: "How painful is it?"   (Scale 1-10; mild, moderate or severe)     *No Answer* 6. FEVER: "Do you have a fever?" If Yes, ask: "What is it, how was it measured, and when did it start?"      *No Answer* 7. CAUSE: "What do you think is causing the leg swelling?"     Unsure  8. MEDICAL HISTORY: "Do you have a history of blood clots (e.g., DVT), cancer, heart failure, kidney disease, or liver failure?"     no 9. RECURRENT SYMPTOM: "Have you had leg swelling before?" If Yes, ask: "When was the last time?" "What happened that time?"     yes 10. OTHER SYMPTOMS: "Do you have any other symptoms?" (e.g., chest pain, difficulty breathing)       Bruising to right ankle medial side - on ankles and knees swollen knot behind calf  left medial swelling. - knot noted right calf - has pain that comes and goes- limping because of pain, full body pain mutiple joints swollen at different times  Protocols used: Leg Swelling and Edema-A-AH

## 2023-06-19 ENCOUNTER — Ambulatory Visit (INDEPENDENT_AMBULATORY_CARE_PROVIDER_SITE_OTHER): Payer: BLUE CROSS/BLUE SHIELD | Admitting: Physician Assistant

## 2023-06-19 ENCOUNTER — Encounter: Payer: Self-pay | Admitting: Physician Assistant

## 2023-06-19 VITALS — BP 130/80 | HR 84 | Ht 66.5 in | Wt 172.1 lb

## 2023-06-19 DIAGNOSIS — Z1211 Encounter for screening for malignant neoplasm of colon: Secondary | ICD-10-CM | POA: Diagnosis not present

## 2023-06-19 DIAGNOSIS — R109 Unspecified abdominal pain: Secondary | ICD-10-CM

## 2023-06-19 DIAGNOSIS — R143 Flatulence: Secondary | ICD-10-CM

## 2023-06-19 DIAGNOSIS — R194 Change in bowel habit: Secondary | ICD-10-CM

## 2023-06-19 MED ORDER — NA SULFATE-K SULFATE-MG SULF 17.5-3.13-1.6 GM/177ML PO SOLN: 1.0000 | Freq: Once | ORAL | 0 refills | Status: AC

## 2023-06-19 NOTE — Patient Instructions (Signed)
Start Citrucel 2 teaspoons in 8 ounces of liquid daily and may increase to twice daily if tolerated.   GasX before meals.   You have been scheduled for a colonoscopy. Please follow written instructions given to you at your visit today.   Please pick up your prep supplies at the pharmacy within the next 1-3 days.  If you use inhalers (even only as needed), please bring them with you on the day of your procedure.  DO NOT TAKE 7 DAYS PRIOR TO TEST- Trulicity (dulaglutide) Ozempic, Wegovy (semaglutide) Mounjaro (tirzepatide) Bydureon Bcise (exanatide extended release)  DO NOT TAKE 1 DAY PRIOR TO YOUR TEST Rybelsus (semaglutide) Adlyxin (lixisenatide) Victoza (liraglutide) Byetta (exanatide) ______________________________________________________________________  _______________________________________________________  If your blood pressure at your visit was 140/90 or greater, please contact your primary care physician to follow up on this.  _______________________________________________________  If you are age 24 or older, your body mass index should be between 23-30. Your Body mass index is 27.37 kg/m. If this is out of the aforementioned range listed, please consider follow up with your Primary Care Provider.  If you are age 54 or younger, your body mass index should be between 19-25. Your Body mass index is 27.37 kg/m. If this is out of the aformentioned range listed, please consider follow up with your Primary Care Provider.   ________________________________________________________  The [place] [facility] providers would like to encourage you to use [facility] to communicate with providers for non-urgent requests or questions.  Due to long hold times on the telephone, sending your provider a message by [facility] may be a faster and more efficient way to get a response.  Please allow 48 business hours for a response.  Please remember that this is for non-urgent requests.   _______________________________________________________

## 2023-06-19 NOTE — Progress Notes (Signed)
I agree with the assessment and plan as outlined by Ms. Lemmon. 
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
## 2023-07-24 ENCOUNTER — Telehealth: Payer: Self-pay | Admitting: Physician Assistant

## 2023-07-24 MED ORDER — NA SULFATE-K SULFATE-MG SULF 17.5-3.13-1.6 GM/177ML PO SOLN: 1.0000 | Freq: Once | ORAL | 0 refills | Status: AC

## 2023-07-24 NOTE — Telephone Encounter (Signed)
Suprep sent to pharmacy 

## 2023-07-24 NOTE — Telephone Encounter (Signed)
Inbound call from patient requesting for prep medication to be resent to [facility] pharmacy on [facility] so the patient is able to pick up for 8/21 procedure. Please advise, thank you.

## 2023-07-29 ENCOUNTER — Telehealth: Payer: Self-pay

## 2023-07-30 ENCOUNTER — Ambulatory Visit (AMBULATORY_SURGERY_CENTER): Payer: BLUE CROSS/BLUE SHIELD | Admitting: Internal Medicine

## 2023-07-30 ENCOUNTER — Encounter: Payer: Self-pay | Admitting: Internal Medicine

## 2023-07-30 VITALS — BP 136/96 | HR 71 | Temp 98.0°F | Resp 15 | Ht 66.0 in | Wt 172.0 lb

## 2023-07-30 DIAGNOSIS — Z1211 Encounter for screening for malignant neoplasm of colon: Secondary | ICD-10-CM

## 2023-07-30 MED ORDER — SODIUM CHLORIDE 0.9 % IV SOLN: 500.0000 mL | INTRAVENOUS | Status: DC

## 2023-07-30 NOTE — Progress Notes (Signed)
Sedate, gd SR, tolerated procedure well, VSS, report to RN 

## 2023-07-30 NOTE — Op Note (Signed)
[facility] Patient Name: Anna [name] Procedure Date: 07/30/2023 10:25 AM MRN: [id number] Endoscopist: Madelyn Brunner [place] , , [id number] Age: 47 Referring MD:  Date of Birth: [birth year] Gender: Female Account #: [id number] Procedure:                Colonoscopy Indications:              Screening for colorectal malignant neoplasm, This                            is the patient's first colonoscopy Medicines:                Monitored Anesthesia Care Procedure:                Pre-Anesthesia Assessment:                           - Prior to the procedure, a History and Physical                            was performed, and patient medications and                            allergies were reviewed. The patient's tolerance of                            previous anesthesia was also reviewed. The risks                            and benefits of the procedure and the sedation                            options and risks were discussed with the patient.                            All questions were answered, and informed consent                            was obtained. Prior Anticoagulants: The patient has                            taken no anticoagulant or antiplatelet agents. ASA                            Grade Assessment: II - A patient with mild systemic                            disease. After reviewing the risks and benefits,                            the patient was deemed in satisfactory condition to                            undergo the procedure.  After obtaining informed consent, the colonoscope                            was passed under direct vision. Throughout the                            procedure, the patient's blood pressure, pulse, and                            oxygen saturations were monitored continuously. The                            CF [id number] #[id number] was introduced through the anus                            and advanced to  the the terminal ileum. The                            colonoscopy was performed without difficulty. The                            patient tolerated the procedure well. The quality                            of the bowel preparation was good. The terminal                            ileum, ileocecal valve, appendiceal orifice, and                            rectum were photographed. Scope In: 10:38:36 AM Scope Out: 10:49:00 AM Scope Withdrawal Time: 0 hours 7 minutes 17 seconds  Total Procedure Duration: 0 hours 10 minutes 24 seconds  Findings:                 The terminal ileum appeared normal.                           A few diverticula were found in the sigmoid colon.                           Non-bleeding internal hemorrhoids were found during                            retroflexion. Complications:            No immediate complications. Estimated Blood Loss:     Estimated blood loss: none. Impression:               - The examined portion of the ileum was normal.                           - Diverticulosis in the sigmoid colon.                           - Non-bleeding internal hemorrhoids.                           -  No specimens collected. Recommendation:           - Discharge patient to home (with escort).                           - Repeat colonoscopy in 10 years for screening                            purposes.                           - The findings and recommendations were discussed                            with the patient. Dr Particia Lather "Alpha" [place],  07/30/2023 10:52:01 AM

## 2023-07-30 NOTE — Patient Instructions (Signed)
Resume all of your previous medications today as ordered.  Read all of the handouts given to you by your recovery room nurse.  YOU HAD AN ENDOSCOPIC PROCEDURE TODAY AT THE [facility]:   Refer to the procedure report that was given to you for any specific questions about what was found during the examination.  If the procedure report does not answer your questions, please call your gastroenterologist to clarify.  If you requested that your care partner not be given the details of your procedure findings, then the procedure report has been included in a sealed envelope for you to review at your convenience later.  YOU SHOULD EXPECT: Some feelings of bloating in the abdomen. Passage of more gas than usual.  Walking can help get rid of the air that was put into your GI tract during the procedure and reduce the bloating. If you had a lower endoscopy (such as a colonoscopy or flexible sigmoidoscopy) you may notice spotting of blood in your stool or on the toilet paper. If you underwent a bowel prep for your procedure, you may not have a normal bowel movement for a few days.  Please Note:  You might notice some irritation and congestion in your nose or some drainage.  This is from the oxygen used during your procedure.  There is no need for concern and it should clear up in a day or so.  SYMPTOMS TO REPORT IMMEDIATELY:  Following lower endoscopy (colonoscopy or flexible sigmoidoscopy):  Excessive amounts of blood in the stool  Significant tenderness or worsening of abdominal pains  Swelling of the abdomen that is new, acute  Fever of 100F or higher   For urgent or emergent issues, a gastroenterologist can be reached at any hour by calling [phone]. Do not use MyChart messaging for urgent concerns.    DIET:  We do recommend a small meal at first, but then you may proceed to your regular diet.  Drink plenty of fluids but you should avoid alcoholic beverages for 24 hours. Try to eat  more fiber in your diet, and drink plenty of water.  ACTIVITY:  You should plan to take it easy for the rest of today and you should NOT DRIVE or use heavy machinery until tomorrow (because of the sedation medicines used during the test).    FOLLOW UP: Our staff will call the number listed on your records the next business day following your procedure.  We will call around 7:15- 8:00 am to check on you and address any questions or concerns that you may have regarding the information given to you following your procedure. If we do not reach you, we will leave a message.     If any biopsies were taken you will be contacted by phone or by letter within the next 1-3 weeks.  Please call us at [phone] if you have not heard about the biopsies in 3 weeks.    SIGNATURES/CONFIDENTIALITY: You and/or your care partner have signed paperwork which will be entered into your electronic medical record.  These signatures attest to the fact that that the information above on your After Visit Summary has been reviewed and is understood.  Full responsibility of the confidentiality of this discharge information lies with you and/or your care-partner.
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
## 2023-07-31 ENCOUNTER — Telehealth: Payer: Self-pay | Admitting: *Deleted

## 2023-07-31 NOTE — Telephone Encounter (Signed)
Attempted to call patient for their post-procedure follow-up call. No answer. Left voicemail.   

## 2023-09-10 ENCOUNTER — Other Ambulatory Visit: Payer: Self-pay

## 2023-09-10 ENCOUNTER — Ambulatory Visit: Payer: BLUE CROSS/BLUE SHIELD | Attending: Nurse Practitioner | Admitting: Nurse Practitioner

## 2023-09-10 ENCOUNTER — Encounter: Payer: Self-pay | Admitting: Nurse Practitioner

## 2023-09-10 VITALS — BP 158/93 | HR 75 | Ht 66.0 in | Wt 168.0 lb

## 2023-09-10 DIAGNOSIS — F418 Other specified anxiety disorders: Secondary | ICD-10-CM

## 2023-09-10 DIAGNOSIS — M25561 Pain in right knee: Secondary | ICD-10-CM

## 2023-09-10 DIAGNOSIS — D500 Iron deficiency anemia secondary to blood loss (chronic): Secondary | ICD-10-CM | POA: Insufficient documentation

## 2023-09-10 DIAGNOSIS — D649 Anemia, unspecified: Secondary | ICD-10-CM

## 2023-09-10 DIAGNOSIS — E7800 Pure hypercholesterolemia, unspecified: Secondary | ICD-10-CM

## 2023-09-10 DIAGNOSIS — I10 Essential (primary) hypertension: Secondary | ICD-10-CM

## 2023-09-10 DIAGNOSIS — M25562 Pain in left knee: Secondary | ICD-10-CM

## 2023-09-10 DIAGNOSIS — F5105 Insomnia due to other mental disorder: Secondary | ICD-10-CM | POA: Diagnosis not present

## 2023-09-10 DIAGNOSIS — G8929 Other chronic pain: Secondary | ICD-10-CM

## 2023-09-10 DIAGNOSIS — Z1211 Encounter for screening for malignant neoplasm of colon: Secondary | ICD-10-CM

## 2023-09-10 MED ORDER — AMLODIPINE BESYLATE 10 MG PO TABS
10.0000 mg | ORAL_TABLET | Freq: Every day | ORAL | 1 refills | Status: DC
  Filled 2023-09-10: qty 90, 90d supply, fill #0

## 2023-09-10 MED ORDER — SERTRALINE HCL 100 MG PO TABS
100.0000 mg | ORAL_TABLET | Freq: Every day | ORAL | 1 refills | Status: DC
  Filled 2023-09-10: qty 90, 90d supply, fill #0

## 2023-09-10 MED ORDER — TRAZODONE HCL 150 MG PO TABS
150.0000 mg | ORAL_TABLET | Freq: Every evening | ORAL | 1 refills | Status: DC | PRN
  Filled 2023-09-10: qty 90, 90d supply, fill #0

## 2023-09-10 NOTE — Progress Notes (Signed)
Assessment & Plan:  Anna [name] was seen today for joint swelling.  Diagnoses and all orders for this visit:  Primary hypertension -     amLODipine (NORVASC) 10 MG tablet; Take 1 tablet (10 mg total) by mouth daily. -     CMP14+EGFR Continue all antihypertensives as prescribed.  Reminded to bring in blood pressure log for follow  up appointment.  RECOMMENDATIONS: DASH/Mediterranean Diets are healthier choices for HTN.    Colon cancer screening -     MM 3D SCREENING MAMMOGRAM BILATERAL BREAST; Future  Chronic pain of both knees -     DG Knee Complete 4 Views Right; Future -     DG Knee Complete 4 Views Left; Future Work on losing weight to help reduce joint pain. May alternate with heat and ice application for pain relief. May also alternate with acetaminophen and Ibuprofen as prescribed pain relief. Other alternatives include massage, acupuncture and water aerobics.    Insomnia secondary to depression with anxiety -     sertraline (ZOLOFT) 100 MG tablet; Take 1 tablet (100 mg total) by mouth daily. -     traZODone (DESYREL) 150 MG tablet; Take 1 tablet (150 mg total) by mouth at bedtime as needed for sleep.  Anemia, unspecified type -     CBC with Differential  Hypercholesterolemia -     Lipid panel    Patient has been counseled on age-appropriate routine health concerns for screening and prevention. These are reviewed and up-to-date. Referrals have been placed accordingly. Immunizations are up-to-date or declined.    Subjective:   Chief Complaint  Patient presents with   Joint Swelling    Knee swelling    HPI Anna [name] 47 y.o. female presents to office today for bilateral knee swelling    Knee Pain: Patient presents with knee swelling involving the  bilateral knees. Onset of the symptoms was several months ago. Inciting event: none known. Current symptoms include stiffness, swelling, and pain . Pain is aggravated by going up and down stairs, standing, walking, and for  prolonged periods of time .  Patient has had no prior knee problems. Evaluation to date: none. Treatment to date: OTC analgesics which are somewhat effective. She is requesting I fill out paperwork for her apartment allowing her first floor housing due to her knee problems.   Blood pressure is elevated today. She also has a plasma center form with her showing blood pressures have been as high as 160/100s. Will start antihypertensive today.  BP Readings from Last 3 Encounters:  09/10/23 (!) 158/93  07/30/23 (!) 136/96  06/19/23 130/80      Review of Systems  Constitutional:  Negative for fever, malaise/fatigue and weight loss.  HENT: Negative.  Negative for nosebleeds.   Eyes: Negative.  Negative for blurred vision, double vision and photophobia.  Respiratory: Negative.  Negative for cough and shortness of breath.   Cardiovascular: Negative.  Negative for chest pain, palpitations and leg swelling.  Gastrointestinal: Negative.  Negative for heartburn, nausea and vomiting.  Musculoskeletal:  Positive for joint pain. Negative for back pain and myalgias.  Neurological: Negative.  Negative for dizziness, focal weakness, seizures and headaches.  Psychiatric/Behavioral:  Positive for depression. Negative for suicidal ideas. The patient is nervous/anxious and has insomnia.     Past Medical History:  Diagnosis Date   Anemia    Anxiety    Hypertension    only elevated at MD office per pt. no meds    Past Surgical History:  Procedure  Laterality Date   ANKLE FRACTURE SURGERY Right    HYSTERECTOMY ABDOMINAL WITH SALPINGECTOMY Bilateral 05/22/2021   Procedure: HYSTERECTOMY ABDOMINAL WITH SALPINGECTOMY AND LEFT OOPHORECTOMY;  Surgeon: Anna Staggers, MD;  Location: [facility] OR;  Service: Gynecology;  Laterality: Bilateral;  TAP BLOCK    Family History  Problem Relation Age of Onset   Hypertension Mother    Breast cancer Mother    Heart failure Maternal Grandfather    Colon cancer Neg Hx     Colon polyps Neg Hx    Esophageal cancer Neg Hx    Rectal cancer Neg Hx    Stomach cancer Neg Hx     Social History Reviewed with no changes to be made today.   Outpatient Medications Prior to Visit  Medication Sig Dispense Refill   sertraline (ZOLOFT) 50 MG tablet Take 1 tablet (50 mg total) by mouth daily. 90 tablet 1   traZODone (DESYREL) 150 MG tablet Take 1 tablet (150 mg total) by mouth at bedtime as needed for sleep. 90 tablet 1   No facility-administered medications prior to visit.    Allergies  Allergen Reactions   Latex Hives and Swelling   Peanut-Containing Drug Products Itching       Objective:    BP (!) 158/93 (BP Location: Left Arm, Patient Position: Sitting, Cuff Size: Normal)   Pulse 75   Ht 5\' 6"  (1.676 m)   Wt 168 lb (76.2 kg)   LMP 04/12/2021 (Approximate)   SpO2 99%   BMI 27.12 kg/m  Wt Readings from Last 3 Encounters:  09/10/23 168 lb (76.2 kg)  07/30/23 172 lb (78 kg)  06/19/23 172 lb 2 oz (78.1 kg)    Physical Exam Vitals and nursing note reviewed.  Constitutional:      Appearance: She is well-developed.  HENT:     Head: Normocephalic and atraumatic.  Cardiovascular:     Rate and Rhythm: Normal rate and regular rhythm.     Heart sounds: Normal heart sounds. No murmur heard.    No friction rub. No gallop.  Pulmonary:     Effort: Pulmonary effort is normal. No tachypnea or respiratory distress.     Breath sounds: Normal breath sounds. No decreased breath sounds, wheezing, rhonchi or rales.  Chest:     Chest wall: No tenderness.  Abdominal:     General: Bowel sounds are normal.     Palpations: Abdomen is soft.  Musculoskeletal:        General: Normal range of motion.     Cervical back: Normal range of motion.  Skin:    General: Skin is warm and dry.  Neurological:     Mental Status: She is alert and oriented to person, place, and time.     Coordination: Coordination normal.  Psychiatric:        Behavior: Behavior normal. Behavior  is cooperative.        Thought Content: Thought content normal.        Judgment: Judgment normal.          Patient has been counseled extensively about nutrition and exercise as well as the importance of adherence with medications and regular follow-up. The patient was given clear instructions to go to ER or return to medical center if symptoms don't improve, worsen or new problems develop. The patient verbalized understanding.   Follow-up: Return for 6 weeks BP f/u.   Claiborne Rigg, FNP-BC [facility] and [facility] [place], [place] [phone]   09/10/2023, 5:33  PM

## 2023-09-11 LAB — LIPID PANEL
Chol/HDL Ratio: 2.1 {ratio} (ref 0.0–4.4)
Cholesterol, Total: 216 mg/dL — ABNORMAL HIGH (ref 100–199)
HDL: 104 mg/dL (ref >39)
LDL Chol Calc (NIH): 101 mg/dL — ABNORMAL HIGH (ref 0–99)
Triglycerides: 60 mg/dL (ref 0–149)
VLDL Cholesterol Cal: 11 mg/dL (ref 5–40)

## 2023-09-11 LAB — CMP14+EGFR
ALT: 16 [IU]/L (ref 0–32)
AST: 29 [IU]/L (ref 0–40)
Albumin: 4.1 g/dL (ref 3.9–4.9)
Alkaline Phosphatase: 101 [IU]/L (ref 44–121)
BUN/Creatinine Ratio: 10 (ref 9–23)
BUN: 9 mg/dL (ref 6–24)
Bilirubin Total: 0.4 mg/dL (ref 0.0–1.2)
CO2: 27 mmol/L (ref 20–29)
Calcium: 9.6 mg/dL (ref 8.7–10.2)
Chloride: 101 mmol/L (ref 96–106)
Creatinine, Ser: 0.88 mg/dL (ref 0.57–1.00)
Globulin, Total: 3.4 g/dL (ref 1.5–4.5)
Glucose: 89 mg/dL (ref 70–99)
Potassium: 3.8 mmol/L (ref 3.5–5.2)
Sodium: 145 mmol/L — ABNORMAL HIGH (ref 134–144)
Total Protein: 7.5 g/dL (ref 6.0–8.5)
eGFR: 81 mL/min/{1.73_m2} (ref >59)

## 2023-09-11 LAB — CBC WITH DIFFERENTIAL/PLATELET
Basophils Absolute: 0 10*3/uL (ref 0.0–0.2)
Basos: 1 %
EOS (ABSOLUTE): 0.2 10*3/uL (ref 0.0–0.4)
Eos: 4 %
Hematocrit: 38.2 % (ref 34.0–46.6)
Hemoglobin: 12.2 g/dL (ref 11.1–15.9)
Immature Grans (Abs): 0 10*3/uL (ref 0.0–0.1)
Immature Granulocytes: 0 %
Lymphocytes Absolute: 1 10*3/uL (ref 0.7–3.1)
Lymphs: 20 %
MCH: 28 pg (ref 26.6–33.0)
MCHC: 31.9 g/dL (ref 31.5–35.7)
MCV: 88 fL (ref 79–97)
Monocytes Absolute: 0.5 10*3/uL (ref 0.1–0.9)
Monocytes: 9 %
Neutrophils Absolute: 3.3 10*3/uL (ref 1.4–7.0)
Neutrophils: 66 %
Platelets: 359 10*3/uL (ref 150–450)
RBC: 4.36 x10E6/uL (ref 3.77–5.28)
RDW: 18 % — ABNORMAL HIGH (ref 11.7–15.4)
WBC: 5.1 10*3/uL (ref 3.4–10.8)

## 2023-09-30 ENCOUNTER — Ambulatory Visit
Admission: RE | Admit: 2023-09-30 | Discharge: 2023-09-30 | Disposition: A | Discharge status: Home / Self Care | Payer: MEDICAID | Source: Ambulatory Visit | Attending: Nurse Practitioner

## 2023-09-30 ENCOUNTER — Ambulatory Visit
Admission: RE | Admit: 2023-09-30 | Discharge: 2023-09-30 | Disposition: A | Discharge status: Home / Self Care | Payer: MEDICAID | Source: Ambulatory Visit | Attending: Nurse Practitioner | Admitting: Nurse Practitioner

## 2023-09-30 DIAGNOSIS — Z1211 Encounter for screening for malignant neoplasm of colon: Secondary | ICD-10-CM

## 2023-09-30 DIAGNOSIS — G8929 Other chronic pain: Secondary | ICD-10-CM

## 2023-10-09 ENCOUNTER — Telehealth: Payer: Self-pay | Admitting: Nurse Practitioner

## 2023-10-09 NOTE — Telephone Encounter (Signed)
Pt. Calling to get imaging results for knees. Not resulted yet. Verbalizes understanding.

## 2023-10-21 ENCOUNTER — Ambulatory Visit: Payer: BLUE CROSS/BLUE SHIELD | Admitting: Nurse Practitioner

## 2023-10-22 ENCOUNTER — Telehealth: Payer: Self-pay

## 2023-10-22 NOTE — Telephone Encounter (Signed)
Copied from CRM [id number]. Topic: General - Other >> Oct 22, 2023  3:35 PM Ja-Kwan M wrote: Reason for CRM: Pt requests call back to go over the results of the imaging for her knee. Cb# [phone]

## 2023-10-23 NOTE — Telephone Encounter (Signed)
Return call unanswered by patient.  Mychart message sent.

## 2023-10-30 ENCOUNTER — Telehealth: Payer: Self-pay

## 2023-10-30 NOTE — Telephone Encounter (Signed)
Copied from CRM [id number]. Topic: General - Other >> Oct 30, 2023  3:30 PM Payton Doughty wrote: Pt calling back for the results of her knee xray,  Please call pt for results.

## 2023-10-31 NOTE — Telephone Encounter (Signed)
Return call unanswered by patient. LVM.

## 2023-10-31 NOTE — Telephone Encounter (Signed)
Report has not been viewed by provider . When it is viewed patient will receive a call.

## 2023-10-31 NOTE — Telephone Encounter (Signed)
Patient has called back requesting a return call for results of an xray she had done of her knee.  Please contact patient back at phone #[phone] (if the call is returned today, this number will work.)

## 2023-11-04 NOTE — Telephone Encounter (Signed)
Return call unanswered by patient.  Mychart message sent.

## 2023-11-04 NOTE — Telephone Encounter (Signed)
Pt has called back again for her knee xray results.  Pt had the xray over a month ago.  [phone]

## 2023-11-10 ENCOUNTER — Telehealth: Payer: Self-pay

## 2023-11-10 NOTE — Telephone Encounter (Signed)
Pt given xray results per notes of Z. Meredeth Ide NP on 11/10/23. Pt verbalized understanding.

## 2024-05-20 ENCOUNTER — Telehealth: Payer: Self-pay | Admitting: Nurse Practitioner

## 2024-05-20 NOTE — Telephone Encounter (Signed)
 Copied from CRM [id number]. Topic: General - Other >> May 20, 2024  3:43 PM Anna [name] R wrote:  Pt Calling to find out if NP Anna [name] can write her a letter to submit to social services that she is disable based on mental health status. I also advised pt to contact [place]  Department [employer] for the status of her application for disabilities services as she states its pending. Please contact pt with any available information

## 2024-05-20 NOTE — Telephone Encounter (Signed)
 I have not seen her consistently enough to document this for her. I have only seen her 2  times since 2023

## 2024-05-21 NOTE — Telephone Encounter (Signed)
 Patient identified by name and date of birth.   Patient aware of response and voiced understanding.

## 2024-05-24 ENCOUNTER — Other Ambulatory Visit: Payer: Self-pay

## 2024-05-24 MED ORDER — CLINDAMYCIN HCL 300 MG PO CAPS
300.0000 mg | ORAL_CAPSULE | Freq: Three times a day (TID) | ORAL | 0 refills | Status: DC
  Filled 2024-05-24: qty 9, 3d supply, fill #0

## 2024-05-24 MED ORDER — IBUPROFEN 400 MG PO TABS
400.0000 mg | ORAL_TABLET | Freq: Four times a day (QID) | ORAL | 0 refills | Status: DC
  Filled 2024-05-24: qty 40, 10d supply, fill #0

## 2024-05-24 MED ORDER — DEXAMETHASONE 2 MG PO TABS
2.0000 mg | ORAL_TABLET | Freq: Three times a day (TID) | ORAL | 0 refills | Status: DC
  Filled 2024-05-24: qty 9, 3d supply, fill #0

## 2024-05-24 MED ORDER — ACETAMINOPHEN-CODEINE 300-30 MG PO TABS
1.0000 | ORAL_TABLET | ORAL | 0 refills | Status: DC | PRN
  Filled 2024-05-24: qty 10, 2d supply, fill #0

## 2024-05-24 MED ORDER — CHLORHEXIDINE GLUCONATE 0.12 % MT SOLN
15.0000 mL | Freq: Three times a day (TID) | OROMUCOSAL | 0 refills | Status: DC
  Filled 2024-05-24: qty 473, 16d supply, fill #0

## 2024-05-29 ENCOUNTER — Ambulatory Visit (HOSPITAL_COMMUNITY)
Admission: EM | Admit: 2024-05-29 | Discharge: 2024-05-29 | Disposition: A | Discharge status: Home / Self Care | Payer: MEDICAID | Attending: Psychiatry | Admitting: Psychiatry

## 2024-05-29 DIAGNOSIS — F4322 Adjustment disorder with anxiety: Secondary | ICD-10-CM | POA: Insufficient documentation

## 2024-05-29 DIAGNOSIS — K08409 Partial loss of teeth, unspecified cause, unspecified class: Secondary | ICD-10-CM | POA: Insufficient documentation

## 2024-05-29 DIAGNOSIS — Z5901 Sheltered homelessness: Secondary | ICD-10-CM | POA: Insufficient documentation

## 2024-05-29 NOTE — Progress Notes (Signed)
   05/29/24 0113  [facility] Triage Screening (Walk-ins at [facility] only)  How Did You Hear About Us ? Legal System  What Is the Reason for Your Visit/Call Today? Pt presents to [facility] voluntarily, accompanied by [employer] and states  I need something to calm me down. Pt reports calling [employer] to her home to transport her to [facility]. Per [employer], pt was calm, but difficult to follow in terms of what she needed. Per chart review, pt has history of substance use, MDD and anxiety. Pt trailed on and on about various topics and had difficulty answering questions posed during triage process. Pt stated  I been seen here before, my information already in the system.  It is unclear if pt is under the influence at this time. Pt unable to complete triage process due to current condition.  How Long Has This Been Causing You Problems? <Week  Have You Recently Had Any Thoughts About Hurting Yourself? No (UTA)  Are You Planning to Commit Suicide/Harm Yourself At This time? No  Have you Recently Had Thoughts About Hurting Someone Sherral? No  Are You Planning To Harm Someone At This Time? No  Physical Abuse  (UTA)  Verbal Abuse  (UTA)  Sexual Abuse  (UTA)  Exploitation of patient/patient's resources  (UTA)  Self-Neglect  (UTA)  Are you currently experiencing any auditory, visual or other hallucinations?  (UTA)  Have You Used Any Alcohol or Drugs in the Past 24 Hours?  (UTA)  Clinician description of patient physical appearance/behavior: appears intoxicated/under the influence, but calm, cooperative  What Do You Feel Would Help You the Most Today? Treatment for Depression or other mood problem  If access to [facility] was not available, would you have sought care in the Emergency Department?  (UTA)  Determination of Need Urgent (48 hours)  Options For Referral Other: Comment;BH Urgent Care;Outpatient Therapy;Medication Management  Determination of Need filed? Yes

## 2024-05-29 NOTE — ED Provider Notes (Signed)
 Behavioral Health Urgent Care Medical Screening Exam  Patient Name: Anna [name] MRN: [id number] Date of Evaluation: 05/29/24 Chief Complaint:  homeless          Diagnosis:  Final diagnoses:  Adjustment disorder with anxious mood    History of Present illness: Anna [name] is a 48 y.o. female presenting to [facility] patient presented to [facility] as a walk in accompanied by [employer] with complaints of seeking services.     Anna [name], 48 y.o., female patient seen face to face by this provider and chart reviewed on 05/29/24.  On evaluation Anna [name] reports that she is homeless and was staying with her mother. Patient states that her situation is not good and that recently had multiple teeth removed. When asked about her situation patient states that she does not want to talk about it. Patient began crying and yelling stating that she is here voluntarily and she wants to leave. Patient refused to answer anymore questions and demanded to leave.                             Patient was discharged per her request with resources and follow up care in outpatient services for Medication Management and Individual Therapy    Flowsheet Row ED from 05/05/2023 in [facility] from 12/20/2022 in [facility] from 11/13/2022 in [facility]  C-SSRS RISK CATEGORY No Risk No Risk Low Risk    Psychiatric Specialty Exam  Presentation  General Appearance:Disheveled  Eye Contact:Minimal  Speech:Pressured  Speech Volume:Increased  Handedness:Left   Mood and Affect  Mood: Angry; Irritable  Affect: Blunt   Thought Process  Thought Processes: Coherent  Descriptions of Associations:Intact  Orientation:Full (Time, Place and Person)  Thought Content:Rumination  Diagnosis of Schizophrenia or Schizoaffective disorder in past: -- (UTA)   Hallucinations:None  Ideas of Reference:None  Suicidal  Thoughts:No  Homicidal Thoughts:No   Sensorium  Memory: Immediate Fair  Judgment: Fair  Insight: Fair   Art therapist  Concentration: Fair  Attention Span: Fair  Recall: [employer] of Knowledge: Fair  Language: Fair   Psychomotor Activity  Psychomotor Activity: Normal   Assets  Assets: Manufacturing systems engineer; Physical Health   Sleep  Sleep: Fair  Number of hours: No data recorded  Physical Exam: Physical Exam HENT:     Head: Normocephalic.     Nose: Nose normal.   Eyes:     Pupils: Pupils are equal, round, and reactive to light.    Cardiovascular:     Rate and Rhythm: Normal rate.  Pulmonary:     Effort: Pulmonary effort is normal.  Abdominal:     General: Abdomen is flat.   Musculoskeletal:        General: Normal range of motion.     Cervical back: Normal range of motion.   Skin:    General: Skin is warm.   Neurological:     Mental Status: She is alert and oriented to person, place, and time.   Psychiatric:        Attention and Perception: She is inattentive.        Mood and Affect: Affect is labile and angry.        Speech: Speech is rapid and pressured.        Behavior: Behavior is agitated.        Thought Content: Thought content is not  paranoid or delusional. Thought content does not include homicidal or suicidal ideation. Thought content does not include homicidal or suicidal plan.        Judgment: Judgment is impulsive.    Review of Systems  Constitutional: Negative.   HENT: Negative.    Eyes: Negative.   Respiratory: Negative.    Cardiovascular: Negative.   Gastrointestinal: Negative.   Genitourinary: Negative.   Musculoskeletal: Negative.   Skin: Negative.   Neurological: Negative.   Endo/Heme/Allergies: Negative.   Psychiatric/Behavioral:  The patient is nervous/anxious.    Blood pressure (!) 184/112, pulse 92, temperature 98.8 F (37.1 C), temperature source Oral, resp. rate 20, last menstrual period  04/12/2021, SpO2 100%. There is no height or weight on file to calculate BMI.  Musculoskeletal: Strength & Muscle Tone: within normal limits Gait & Station: normal Patient leans: N/A   [facility] MSE Discharge Disposition for Follow up and Recommendations: Based on my evaluation the patient does not appear to have an emergency medical condition and can be discharged with resources and follow up care in outpatient services for Medication Management and Individual Therapy   Arrow Tomko E Devante Capano, NP 05/29/2024, 6:12 AM

## 2024-05-29 NOTE — Discharge Instructions (Signed)

## 2024-05-29 NOTE — BH Assessment (Signed)
 Comprehensive Clinical Assessment (CCA) Note  05/29/2024 Anna [name] [id number]  Disposition: Anna Olp, NP recommends discharged. Pt demanded to leave.   The patient demonstrates the following risk factors for suicide: Chronic risk factors for suicide include: psychiatric disorder of Generalized Anxiety Disorder. Acute risk factors for suicide include: UTA. Protective factors for this patient include: UTA. Considering these factors, the overall suicide risk at this point appears to be UTA. Patient is appropriate for outpatient follow up.  Anna [name] is a 48 year old female who presents voluntary and unaccompanied to [facility] ([facility]). The Nurse Practitioner asked the pt, what brought you to the hospital? Pt reports, she initially had a good day then it was not going good. Pt reports, she can not get in to right now. Pt reports, she called the police, I can't get into it, I got a lot of stuff going on; I don't feel good this is the best place to be. Pt then reports, I need to get away, I went to the dentist and got nine teeth pulled, I don't know what's going on. Pt then refused to talk asked, clinician attempted to ask additional questions.   Pt refused to answer questions about possible substance use.   Pt presents, alert, restless, sitting in a chair in the assessment room in casual attire with loud, pressured speech and avoided eye contact. Pt's mood was anger, irritable. Pt's affect was congruent. Pt's insight is poor. Pt's judgement was fair. Clinician asked the pt if she feels she can keep  her self safe if discharged, pt replied, yea I'm good.   Chief Complaint:  Chief Complaint  Patient presents with   Evaluation   Visit Diagnosis: Generalized Anxiety Disorder.    CCA Screening, Triage and Referral (STR)  Patient Reported Information How did you hear about us ? Legal System  What Is the Reason for Your Visit/Call Today? Pt  presents to [facility] voluntarily, accompanied by [employer] and states  I need something to calm me down. Pt reports calling [employer] to her home to transport her to [facility]. Per [employer], pt was calm, but difficult to follow in terms of what she needed. Per chart review, pt has history of substance use, MDD and anxiety. Pt trailed on and on about various topics and had difficulty answering questions posed during triage process. Pt stated  I been seen here before, my information already in the system.  It is unclear if pt is under the influence at this time. Pt unable to complete triage process due to current condition.  How Long Has This Been Causing You Problems? <Week  What Do You Feel Would Help You the Most Today? Treatment for Depression or other mood problem   Have You Recently Had Any Thoughts About Hurting Yourself? No (UTA)  Are You Planning to Commit Suicide/Harm Yourself At This time? No   Flowsheet Row ED from 05/05/2023 in [facility] Department at [facility] from 12/20/2022 in [facility] at [facility] from 11/13/2022 in [facility]  C-SSRS RISK CATEGORY No Risk No Risk Low Risk    Have you Recently Had Thoughts About Hurting Someone Anna [name]? No  Are You Planning to Harm Someone at This Time? No  Explanation: NA   Have You Used Any Alcohol or Drugs in the Past 24 Hours? -- (UTA)  How Long Ago Did You Use Drugs or Alcohol? UTA What Did You Use and How Much? UTA  Do You Currently Have a Therapist/Psychiatrist? -- (UTA)  Name of Therapist/Psychiatrist:    Have You Been Recently Discharged From Any Office Practice or Programs? -- (UTA)  Explanation of Discharge From Practice/Program: UTA    CCA Screening Triage Referral Assessment Type of Contact: Face-to-Face  Telemedicine Service Delivery:   Is this Initial or Reassessment?   Date Telepsych consult ordered in [facility]:    Time Telepsych consult ordered in [facility]:     Location of Assessment: [facility] Services  Provider Location: [facility] Assessment Services   Collateral Involvement: NA   Does Patient Have a Automotive engineer Guardian? No  Legal Guardian Contact Information: UTA  Copy of Legal Guardianship Form: -- (UTA)  Legal Guardian Notified of Arrival: -- (UTA)  Legal Guardian Notified of Pending Discharge: -- (UTA)  If Minor and Not Living with Parent(s), Who has Custody? UTA  Is CPS involved or ever been involved? -- (UTA)  Is APS involved or ever been involved? -- (UTA)   Patient Determined To Be At Risk for Harm To Self or Others Based on Review of Patient Reported Information or Presenting Complaint? -- (UTA)  Method: -- (UTA)  Availability of Means: -- (UTA)  Intent: -- (UTA)  Notification Required: -- (UTA)  Additional Information for Danger to Others Potential: -- (UTA)  Additional Comments for Danger to Others Potential: UTA  Are There Guns or Other Weapons in Your Home? -- (UTA)  Types of Guns/Weapons: UTA  Are These Weapons Safely Secured?                            -- (UTA)  Who Could Verify You Are Able To Have These Secured: UTA  Do You Have any Outstanding Charges, Pending Court Dates, Parole/Probation? UTA  Contacted To Inform of Risk of Harm To Self or Others: Other: Comment (UTA)    Does Patient Present under Involuntary Commitment? -- GINETTE)    [place] of Residence: [place]   Patient Currently Receiving the Following Services: -- (UTA)   Determination of Need: Urgent (48 hours)   Options For Referral: Other: Comment; [facility] Urgent Care; Outpatient Therapy; Medication Management     CCA Biopsychosocial Patient Reported Schizophrenia/Schizoaffective Diagnosis in Past: -- (UTA)   Strengths: Pt initially came in for help.   Mental Health Symptoms Depression:  Irritability; Difficulty Concentrating   Duration of Depressive symptoms: Duration of Depressive Symptoms: N/A    Mania:  Racing thoughts   Anxiety:   Restlessness; Irritability; Difficulty concentrating   Psychosis:  Grossly disorganized speech   Duration of Psychotic symptoms: Duration of Psychotic Symptoms: N/A   Trauma:  -- (UTA)   Obsessions:  -- (UTA)   Compulsions:  Poor Insight   Inattention:  Disorganized   Hyperactivity/Impulsivity:  Feeling of restlessness; Blurts out answers   Oppositional/Defiant Behaviors:  Argumentative; Easily annoyed; Angry; Temper   Emotional Irregularity:  -- (UTA)   Other Mood/Personality Symptoms:  UTA    Mental Status Exam Appearance and self-care  Stature:  Average   Weight:  Average weight   Clothing:  Casual   Grooming:  Normal   Cosmetic use:  None   Posture/gait:  Normal   Motor activity:  Agitated; Restless   Sensorium  Attention:  Inattentive   Concentration:  Focuses on irrelevancies   Orientation:  Person; Place   Recall/memory:  Defective in Immediate   Affect and Mood  Affect:  Congruent   Mood:  Irritable;  Angry   Relating  Eye contact:  Avoided   Facial expression:  Angry   Attitude toward examiner:  Argumentative; Irritable; Guarded   Thought and Language  Speech flow: Loud; Pressured   Thought content:  -- (UTA)   Preoccupation:  -- (Not having a good day.)   Hallucinations:  -- (UTA)   Organization:  Disorganized   Company secretary of Knowledge:  Poor   Intelligence:  Average   Abstraction:  -- Industrial/product designer)   Judgement:  Fair   Reality Testing:  -- (UTA)   Insight:  Poor   Decision Making:  Confused   Social Functioning  Social Maturity:  -- Industrial/product designer)   Social Judgement:  -- (UTA)   Stress  Stressors:  Other (Comment) (UTA)   Coping Ability:  -- (UTA)   Skill Deficits:  Communication; Decision making; Responsibility   Supports:  Other (Comment) (UTA)     Religion: Religion/Spirituality Are You A Religious Person?:  (UTA) How Might This Affect Treatment?:  UTA  Leisure/Recreation: Leisure / Recreation Do You Have Hobbies?:  (UTA)  Exercise/Diet: Exercise/Diet Do You Exercise?:  (UTA) Have You Gained or Lost A Significant Amount of Weight in the Past Six Months?:  (UTA) Do You Follow a Special Diet?:  (UTA) Do You Have Any Trouble Sleeping?:  (UTA)   CCA Employment/Education Employment/Work Situation: Employment / Work Situation Employment Situation:  Industrial/product designer) Patient's Job has Been Impacted by Current Illness:  (UTA) Has Patient ever Been in the [employer]?:  (UTA)  Education: Education Is Patient Currently Attending School?:  (UTA) Last Grade Completed:  (UTA) Did You Attend College?:  (UTA) Did You Have An Individualized Education Program (IIEP):  (UTA) Did You Have Any Difficulty At School?:  (UTA) Patient's Education Has Been Impacted by Current Illness:  (UTA)   CCA Family/Childhood History Family and Relationship History: Family history Marital status:  (UTA) Does patient have children?:  (UTA)  Childhood History:  Childhood History By whom was/is the patient raised?: Other (Comment) (UTA) Did patient suffer any verbal/emotional/physical/sexual abuse as a child?:  (UTA) Did patient suffer from severe childhood neglect?:  (UTA) Has patient ever been sexually abused/assaulted/raped as an adolescent or adult?:  (UTA) Was the patient ever a victim of a crime or a disaster?:  (UTA) Witnessed domestic violence?:  (UTA) Has patient been affected by domestic violence as an adult?:  (UTA)   CCA Substance Use Alcohol/Drug Use: Alcohol / Drug Use Pain Medications: See MAR Prescriptions: See MAR Over the Counter: See MAR History of alcohol / drug use?:  (UTA) Longest period of sobriety (when/how long): UTA Negative Consequences of Use:  (UTA) Withdrawal Symptoms:  (UTA)    ASAM's:  Six Dimensions of Multidimensional Assessment  Dimension 1:  Acute Intoxication and/or Withdrawal Potential:      Dimension 2:   Biomedical Conditions and Complications:      Dimension 3:  Emotional, Behavioral, or Cognitive Conditions and Complications:     Dimension 4:  Readiness to Change:     Dimension 5:  Relapse, Continued use, or Continued Problem Potential:     Dimension 6:  Recovery/Living Environment:     ASAM Severity Score:    ASAM Recommended Level of Treatment:     Substance use Disorder (SUD)    Recommendations for Services/Supports/Treatments: Recommendations for Services/Supports/Treatments Recommendations For Services/Supports/Treatments: Other (Comment) (Pt requested discharge.)  Disposition Recommendation per psychiatric provider: NP recommends discharged. Pt demanded to leave.    DSM5 Diagnoses: Patient Active Problem List  Diagnosis Date Noted   Iron deficiency anemia due to chronic blood loss 09/10/2023   MDD (major depressive disorder), recurrent severe, without psychosis (HCC) 05/30/2022   History of total abdominal hysterectomy 06/20/2021   History of left oophorectomy 06/20/2021   Post-operative state 05/22/2021   Visit for routine gyn exam 05/09/2021   Fibroids 05/09/2021   Symptomatic anemia 04/20/2021   Microscopic hematuria 04/20/2021   Vaginal bleeding 03/21/2020   Depression 03/21/2020   Substance induced mood disorder (HCC) 03/29/2019   Menorrhagia with regular cycle 03/26/2019   Cocaine substance abuse (HCC) 03/26/2019     Referrals to Alternative Service(s): Referred to Alternative Service(s):   Place:   Date:   Time:    Referred to Alternative Service(s):   Place:   Date:   Time:    Referred to Alternative Service(s):   Place:   Date:   Time:    Referred to Alternative Service(s):   Place:   Date:   Time:     Anna [name], [facility] Comprehensive Clinical Assessment (CCA) Screening, Triage and Referral Note  05/29/2024 Anna [name] [id number]  Chief Complaint:  Chief Complaint  Patient presents with   Evaluation   Visit Diagnosis:   Patient  Reported Information How did you hear about us ? Legal System  What Is the Reason for Your Visit/Call Today? Pt presents to [facility] voluntarily, accompanied by [employer] and states  I need something to calm me down. Pt reports calling [employer] to her home to transport her to [facility]. Per [employer], pt was calm, but difficult to follow in terms of what she needed. Per chart review, pt has history of substance use, MDD and anxiety. Pt trailed on and on about various topics and had difficulty answering questions posed during triage process. Pt stated  I been seen here before, my information already in the system.  It is unclear if pt is under the influence at this time. Pt unable to complete triage process due to current condition.  How Long Has This Been Causing You Problems? <Week  What Do You Feel Would Help You the Most Today? Treatment for Depression or other mood problem   Have You Recently Had Any Thoughts About Hurting Yourself? No (UTA)  Are You Planning to Commit Suicide/Harm Yourself At This time? No   Have you Recently Had Thoughts About Hurting Someone Anna [name]? No  Are You Planning to Harm Someone at This Time? No  Explanation: NA   Have You Used Any Alcohol or Drugs in the Past 24 Hours? -- (UTA)  How Long Ago Did You Use Drugs or Alcohol? UTA What Did You Use and How Much? UTA  Do You Currently Have a Therapist/Psychiatrist? -- (UTA)  Name of Therapist/Psychiatrist: UTA  Have You Been Recently Discharged From Any Office Practice or Programs? -- (UTA)  Explanation of Discharge From Practice/Program: UTA   CCA Screening Triage Referral Assessment Type of Contact: Face-to-Face  Telemedicine Service Delivery:   Is this Initial or Reassessment?   Date Telepsych consult ordered in [facility]:    Time Telepsych consult ordered in [facility]:    Location of Assessment: [facility] Services  Provider Location: [facility] Assessment Services    Collateral Involvement: NA   Does Patient Have a Production manager Guardian? UTA Name and Contact of Legal Guardian: UTA If Minor and Not Living with Parent(s), Who has Custody? UTA  Is CPS involved or ever been involved? -- (UTA)  Is APS involved or ever been involved? -- (  UTA)   Patient Determined To Be At Risk for Harm To Self or Others Based on Review of Patient Reported Information or Presenting Complaint? -- (UTA)  Method: -- (UTA)  Availability of Means: -- (UTA)  Intent: -- (UTA)  Notification Required: -- (UTA)  Additional Information for Danger to Others Potential: -- (UTA)  Additional Comments for Danger to Others Potential: UTA  Are There Guns or Other Weapons in Your Home? -- (UTA)  Types of Guns/Weapons: UTA  Are These Weapons Safely Secured?                            -- (UTA)  Who Could Verify You Are Able To Have These Secured: UTA  Do You Have any Outstanding Charges, Pending Court Dates, Parole/Probation? UTA  Contacted To Inform of Risk of Harm To Self or Others: Other: Comment (UTA)   Does Patient Present under Involuntary Commitment? -- GINETTE)    [place] of Residence: [place]   Patient Currently Receiving the Following Services: -- (UTA)   Determination of Need: Urgent (48 hours)   Options For Referral: Other: Comment; [facility] Urgent Care; Outpatient Therapy; Medication Management   Disposition Recommendation per psychiatric provider: NP recommends discharged. Pt demanded to leave.   Anna [name], LCMHC     Christon Parada D Anna Isola, MS, [facility], [facility] Triage Specialist [phone]

## 2024-05-29 NOTE — ED Notes (Signed)
 [employer] called for assistance with return to home.

## 2024-07-07 ENCOUNTER — Emergency Department (HOSPITAL_COMMUNITY): Payer: MEDICAID

## 2024-07-07 ENCOUNTER — Emergency Department (HOSPITAL_COMMUNITY)
Admission: EM | Admit: 2024-07-07 | Discharge: 2024-07-07 | Disposition: A | Discharge status: Home / Self Care | Payer: MEDICAID

## 2024-07-07 ENCOUNTER — Other Ambulatory Visit: Payer: Self-pay

## 2024-07-07 ENCOUNTER — Encounter (HOSPITAL_COMMUNITY): Payer: Self-pay

## 2024-07-07 DIAGNOSIS — I10 Essential (primary) hypertension: Secondary | ICD-10-CM | POA: Insufficient documentation

## 2024-07-07 DIAGNOSIS — Z91010 Allergy to peanuts: Secondary | ICD-10-CM | POA: Insufficient documentation

## 2024-07-07 DIAGNOSIS — Z79899 Other long term (current) drug therapy: Secondary | ICD-10-CM | POA: Insufficient documentation

## 2024-07-07 DIAGNOSIS — N390 Urinary tract infection, site not specified: Secondary | ICD-10-CM | POA: Diagnosis not present

## 2024-07-07 DIAGNOSIS — Z5900 Homelessness unspecified: Secondary | ICD-10-CM | POA: Diagnosis not present

## 2024-07-07 DIAGNOSIS — R55 Syncope and collapse: Secondary | ICD-10-CM | POA: Insufficient documentation

## 2024-07-07 DIAGNOSIS — F419 Anxiety disorder, unspecified: Secondary | ICD-10-CM | POA: Insufficient documentation

## 2024-07-07 DIAGNOSIS — Z91040 Latex allergy status: Secondary | ICD-10-CM | POA: Diagnosis not present

## 2024-07-07 LAB — BASIC METABOLIC PANEL WITH GFR
Anion gap: 17 — ABNORMAL HIGH (ref 5–15)
BUN: 9 mg/dL (ref 6–20)
CO2: 23 mmol/L (ref 22–32)
Calcium: 9.3 mg/dL (ref 8.9–10.3)
Chloride: 103 mmol/L (ref 98–111)
Creatinine, Ser: 0.74 mg/dL (ref 0.44–1.00)
GFR, Estimated: >60 mL/min (ref >60)
Glucose, Bld: 79 mg/dL (ref 70–99)
Potassium: 3.1 mmol/L — ABNORMAL LOW (ref 3.5–5.1)
Sodium: 143 mmol/L (ref 135–145)

## 2024-07-07 LAB — CBC
HCT: 37.9 % (ref 36.0–46.0)
Hemoglobin: 12.5 g/dL (ref 12.0–15.0)
MCH: 29.1 pg (ref 26.0–34.0)
MCHC: 33 g/dL (ref 30.0–36.0)
MCV: 88.1 fL (ref 80.0–100.0)
Platelets: 320 K/uL (ref 150–400)
RBC: 4.3 MIL/uL (ref 3.87–5.11)
RDW: 16.5 % — ABNORMAL HIGH (ref 11.5–15.5)
WBC: 5.5 K/uL (ref 4.0–10.5)
nRBC: 0 % (ref 0.0–0.2)

## 2024-07-07 LAB — URINALYSIS, ROUTINE W REFLEX MICROSCOPIC
Bilirubin Urine: NEGATIVE
Glucose, UA: NEGATIVE mg/dL
Ketones, ur: NEGATIVE mg/dL
Nitrite: NEGATIVE
Protein, ur: >=300 mg/dL — AB
Specific Gravity, Urine: 1.003 — ABNORMAL LOW (ref 1.005–1.030)
pH: 7 (ref 5.0–8.0)

## 2024-07-07 LAB — RAPID URINE DRUG SCREEN, HOSP PERFORMED
Amphetamines: NOT DETECTED
Barbiturates: NOT DETECTED
Benzodiazepines: NOT DETECTED
Cocaine: NOT DETECTED
Opiates: NOT DETECTED
Tetrahydrocannabinol: NOT DETECTED

## 2024-07-07 LAB — HCG, SERUM, QUALITATIVE: Preg, Serum: NEGATIVE

## 2024-07-07 LAB — D-DIMER, QUANTITATIVE: D-Dimer, Quant: 0.33 ug{FEU}/mL (ref 0.00–0.50)

## 2024-07-07 MED ORDER — HYDROXYZINE HCL 25 MG PO TABS
25.0000 mg | ORAL_TABLET | Freq: Four times a day (QID) | ORAL | 0 refills | Status: DC
  Filled 2024-07-07 – 2024-07-09 (×3): qty 12, 3d supply, fill #0

## 2024-07-07 MED ORDER — POTASSIUM CHLORIDE CRYS ER 20 MEQ PO TBCR
40.0000 meq | EXTENDED_RELEASE_TABLET | Freq: Once | ORAL | Status: AC
  Administered 2024-07-07: 40 meq via ORAL
  Filled 2024-07-07: qty 2

## 2024-07-07 MED ORDER — CEPHALEXIN 500 MG PO CAPS
500.0000 mg | ORAL_CAPSULE | Freq: Four times a day (QID) | ORAL | 0 refills | Status: DC
  Filled 2024-07-07 – 2024-07-09 (×3): qty 28, 7d supply, fill #0

## 2024-07-07 MED ORDER — ONDANSETRON 4 MG PO TBDP
4.0000 mg | ORAL_TABLET | Freq: Three times a day (TID) | ORAL | 0 refills | Status: DC | PRN
  Filled 2024-07-07: qty 20, 7d supply, fill #0

## 2024-07-07 MED ORDER — CEPHALEXIN 500 MG PO CAPS
500.0000 mg | ORAL_CAPSULE | Freq: Once | ORAL | Status: AC
  Administered 2024-07-07: 500 mg via ORAL
  Filled 2024-07-07: qty 1

## 2024-07-07 MED ORDER — CEPHALEXIN 500 MG PO CAPS: 500.0000 mg | ORAL_CAPSULE | Freq: Four times a day (QID) | ORAL | 0 refills | Status: DC

## 2024-07-07 MED ORDER — HYDROXYZINE HCL 25 MG PO TABS
25.0000 mg | ORAL_TABLET | Freq: Once | ORAL | Status: AC
  Administered 2024-07-07: 25 mg via ORAL
  Filled 2024-07-07: qty 1

## 2024-07-07 MED ORDER — ONDANSETRON 4 MG PO TBDP: 4.0000 mg | ORAL_TABLET | Freq: Three times a day (TID) | ORAL | 0 refills | Status: DC | PRN

## 2024-07-07 MED ORDER — HYDROXYZINE HCL 25 MG PO TABS: 25.0000 mg | ORAL_TABLET | Freq: Four times a day (QID) | ORAL | 0 refills | Status: DC

## 2024-07-07 MED ORDER — ONDANSETRON HCL 4 MG/2ML IJ SOLN
4.0000 mg | Freq: Once | INTRAMUSCULAR | Status: AC
  Administered 2024-07-07: 4 mg via INTRAVENOUS
  Filled 2024-07-07: qty 2

## 2024-07-07 MED ORDER — LACTATED RINGERS IV BOLUS
1000.0000 mL | Freq: Once | INTRAVENOUS | Status: AC
  Administered 2024-07-07: 1000 mL via INTRAVENOUS

## 2024-07-07 NOTE — ED Notes (Signed)
 Pt is now vomiting, MD notified

## 2024-07-07 NOTE — ED Provider Notes (Signed)
 [place] EMERGENCY DEPARTMENT AT [facility] Provider Note   CSN: [id number] Arrival date & time: 07/07/24  1604     Patient presents with: Anxiety   Anna [name] is a 48 y.o. female.   48 year old female with past medical history of hypertension and anxiety presenting to the emergency department today after a syncopal episode.  The patient states that she got overheated and started to feel lightheaded.  States that she did lose consciousness.  States that she also drank a 4 Loco earlier today prior to this.  Denies any associated chest pain but was reporting some lightheadedness and shortness of breath.  She states that she did hit her head when she fell and is complaining of a headache currently.  She states that she is homeless and this is a new situation for her and she has been feeling very anxious about this.  She denies any SI or HI.   Anxiety Associated symptoms include headaches.       Prior to Admission medications   Medication Sig Start Date End Date Taking? Authorizing Provider  cephALEXin  (KEFLEX ) 500 MG capsule Take 1 capsule (500 mg total) by mouth 4 (four) times daily. 07/07/24  Yes Ula Prentice SAUNDERS, MD  hydrOXYzine  (ATARAX ) 25 MG tablet Take 1 tablet (25 mg total) by mouth every 6 (six) hours. 07/07/24  Yes Ula Prentice SAUNDERS, MD  ondansetron  (ZOFRAN -ODT) 4 MG disintegrating tablet Take 1 tablet (4 mg total) by mouth every 8 (eight) hours as needed for nausea or vomiting. 07/07/24  Yes Ula Prentice SAUNDERS, MD  acetaminophen -codeine  (TYLENOL  #3) 300-30 MG tablet Take 1 tablet by mouth every 4 (four) hours as needed for pain. 05/24/24     amLODipine  (NORVASC ) 10 MG tablet Take 1 tablet (10 mg total) by mouth daily. 09/10/23   Fleming, Zelda W, NP  chlorhexidine  (PERIDEX ) 0.12 % solution Swish 1 capful for 30 seconds, then spit three times daily. 05/24/24     clindamycin  (CLEOCIN ) 300 MG capsule Take 1 capsule (300 mg total) by mouth 3 (three) times daily with a glass of water .  05/24/24     dexamethasone  (DECADRON ) 2 MG tablet Take 1 tablet (2 mg total) by mouth 3 (three) times daily. 05/24/24     ibuprofen  (ADVIL ) 400 MG tablet Take 1 tablet (400 mg total) by mouth 4 (four) times daily with food. 05/24/24     sertraline  (ZOLOFT ) 100 MG tablet Take 1 tablet (100 mg total) by mouth daily. 09/10/23   Fleming, Zelda W, NP  traZODone  (DESYREL ) 150 MG tablet Take 1 tablet (150 mg total) by mouth at bedtime as needed for sleep. 09/10/23   Theotis Haze ORN, NP    Allergies: Latex and Peanut-containing drug products    Review of Systems  Neurological:  Positive for headaches.  Psychiatric/Behavioral:  The patient is nervous/anxious.   All other systems reviewed and are negative.   Updated Vital Signs BP (!) 147/96   Pulse 86   Temp 98.9 F (37.2 C) (Oral)   Resp 20   Ht 5' 6 (1.676 m)   Wt 76.2 kg   LMP 04/12/2021 (Approximate)   SpO2 100%   BMI 27.11 kg/m   Physical Exam Vitals and nursing note reviewed.   Gen: NAD, disheveled Eyes: PERRL, EOMI HEENT: no oropharyngeal swelling Neck: trachea midline Resp: clear to auscultation bilaterally Card: RRR, no murmurs, rubs, or gallops Abd: nontender, nondistended Extremities: no calf tenderness, no edema Vascular: 2+ radial pulses bilaterally, 2+ DP pulses bilaterally  Skin: no rashes Psyc: acting appropriately   (all labs ordered are listed, but only abnormal results are displayed) Labs Reviewed  BASIC METABOLIC PANEL WITH GFR - Abnormal; Notable for the following components:      Result Value   Potassium 3.1 (*)    Anion gap 17 (*)    All other components within normal limits  CBC - Abnormal; Notable for the following components:   RDW 16.5 (*)    All other components within normal limits  URINALYSIS, ROUTINE W REFLEX MICROSCOPIC - Abnormal; Notable for the following components:   Color, Urine STRAW (*)    APPearance CLOUDY (*)    Specific Gravity, Urine 1.003 (*)    Hgb urine dipstick MODERATE (*)     Protein, ur >=300 (*)    Leukocytes,Ua LARGE (*)    Bacteria, UA MANY (*)    All other components within normal limits  HCG, SERUM, QUALITATIVE  D-DIMER, QUANTITATIVE  RAPID URINE DRUG SCREEN, HOSP PERFORMED    EKG: EKG Interpretation Date/Time:  Wednesday July 07 2024 17:25:59 EDT Ventricular Rate:  94 PR Interval:  194 QRS Duration:  90 QT Interval:  376 QTC Calculation: 471 R Axis:   32  Text Interpretation: Sinus rhythm Probable left atrial enlargement Confirmed by Ula Barter [phone]) on 07/07/2024 5:29:02 PM  Radiology: CT Cervical Spine Wo Contrast Result Date: 07/07/2024 EXAM: CT CERVICAL SPINE WITHOUT CONTRAST 07/07/2024 05:35:00 PM TECHNIQUE: CT of the cervical spine was performed without the administration of intravenous contrast. Multiplanar reformatted images are provided for review. Automated exposure control, iterative reconstruction, and/or weight based adjustment of the mA/kV was utilized to reduce the radiation dose to as low as reasonably achievable. COMPARISON: None available. CLINICAL HISTORY: Neck trauma, dangerous injury mechanism (Age 57-64y). Headache, increasing frequency or severity. FINDINGS: CERVICAL SPINE: BONES AND ALIGNMENT: Straightening insight reversal of the normal cervical lordosis. No listhesis. No facet subluxation or dislocation. No compression fracture or displaced fracture in the cervical spine. No suspicious osseous lesion. DEGENERATIVE CHANGES: Degenerative endplate osteophytes at multiple levels. Facet arthrosis at multiple levels. There is no high-grade osseous spinal canal stenosis. SOFT TISSUES: The paraspinal soft tissues are unremarkable. There is no prevertebral soft tissue swelling. LUNGS: The visualized lung apices are unremarkable. IMPRESSION: 1. No acute abnormality of the cervical spine related to the reported neck trauma. Electronically signed by: Donnice Mania MD 07/07/2024 05:47 PM EDT RP Workstation: [id number]   DG Chest Portable 1  View Result Date: 07/07/2024 CLINICAL DATA:  Shortness of breath EXAM: PORTABLE CHEST 1 VIEW COMPARISON:  Chest radiograph dated 12/20/2022. FINDINGS: No focal consolidation, pleural effusion or pneumothorax. Top-normal cardiac size. No acute osseous pathology. IMPRESSION: No active disease. Electronically Signed   By: Vanetta Chou M.D.   On: 07/07/2024 17:44   CT Head Wo Contrast Result Date: 07/07/2024 EXAM: CT HEAD WITHOUT 07/07/2024 05:35:00 PM TECHNIQUE: CT of the head was performed without the administration of intravenous contrast. Automated exposure control, iterative reconstruction, and/or weight based adjustment of the mA/kV was utilized to reduce the radiation dose to as low as reasonably achievable. COMPARISON: None available. CLINICAL HISTORY: Headache, increasing frequency or severity. FINDINGS: BRAIN AND VENTRICLES: No acute intracranial hemorrhage. No mass effect or midline shift. No extra-axial fluid collection. Gray-white differentiation is maintained. No hydrocephalus. ORBITS: No acute abnormality. SINUSES AND MASTOIDS: Secretions noted within the partially visualized right maxillary sinus. The mastoid air cells are clear. SOFT TISSUES AND SKULL: No acute skull fracture. No acute soft tissue abnormality. IMPRESSION: 1.  No acute intracranial abnormality. 2. Secretions within the partially visualized right maxillary sinus. Electronically signed by: Donnice Mania MD 07/07/2024 05:42 PM EDT RP Workstation: [id number]     Procedures   Medications Ordered in the ED  potassium chloride  SA (KLOR-CON  M) CR tablet 40 mEq (has no administration in time range)  lactated ringers  bolus 1,000 mL (0 mLs Intravenous Stopped 07/07/24 1855)  cephALEXin  (KEFLEX ) capsule 500 mg (500 mg Oral Given 07/07/24 2035)  ondansetron  (ZOFRAN ) injection 4 mg (4 mg Intravenous Given 07/07/24 2034)  hydrOXYzine  (ATARAX ) tablet 25 mg (25 mg Oral Given 07/07/24 2054)                                    Medical  Decision Making 48 year old female with past medical history of hypertension and homelessness presenting to the emergency department today after a syncopal episode.  Will further evaluate patient here with basic labs to evaluate for anemia or electrolyte abnormalities.  Will obtain EKG and keep patient on the cardiac monitor to evaluate for ACS, pulmonary edema, pulmonary [place], or pneumothorax.  The patient's pulse ox here is 100 on arrival at her O2 sats are 94%.  Will obtain a D-dimer to evaluate for pulmonary embolism as cause for her episode.  This may be due to dehydration.  Will give her IV fluids.  I will reevaluate for ultimate disposition.  The patient's labs here are reassuring.  EKG does not show any concerning findings.  The patient remained stable here on the monitor.  She is ambulatory on reassessment.  She will be discharged with return precautions.  Urinalysis does shows findings concerning for infection.  The patient be started on Keflex .  Her potassium is low and is given oral potassium.  Amount and/or Complexity of Data Reviewed Labs: ordered. Radiology: ordered.  Risk Prescription drug management.        Final diagnoses:  Anxiety  Syncope, unspecified syncope type  Urinary tract infection without hematuria, site unspecified    ED Discharge Orders          Ordered    cephALEXin  (KEFLEX ) 500 MG capsule  4 times daily        07/07/24 2059    ondansetron  (ZOFRAN -ODT) 4 MG disintegrating tablet  Every 8 hours PRN        07/07/24 2059    hydrOXYzine  (ATARAX ) 25 MG tablet  Every 6 hours        07/07/24 2059               Ula Prentice SAUNDERS, MD 07/07/24 2101

## 2024-07-07 NOTE — ED Notes (Signed)
Pt able to ambulate with no difficulty

## 2024-07-07 NOTE — ED Triage Notes (Signed)
 Pt recently homeless. Anxiety 3-4 weeks. Drank a 4 Loco. Pt should not be stood at this time. Pt stiffens like a board and will fall in the floor. A&Ox4. CBG 78.

## 2024-07-07 NOTE — ED Notes (Signed)
 Pt was able to ambulate to bedside commode. Pt was a little unsteady.

## 2024-07-07 NOTE — Discharge Instructions (Addendum)
 Your urine shows that you likely have a urine infection.  Please take the Keflex  as prescribed.  Take the hydroxyzine  as needed for anxiety.  Take Zofran  as needed for nausea.

## 2024-07-07 NOTE — ED Notes (Signed)
Pt given sprite to drink. 

## 2024-07-08 ENCOUNTER — Other Ambulatory Visit: Payer: Self-pay

## 2024-07-09 ENCOUNTER — Other Ambulatory Visit (HOSPITAL_COMMUNITY): Payer: Self-pay

## 2024-07-09 ENCOUNTER — Other Ambulatory Visit: Payer: Self-pay

## 2024-07-23 ENCOUNTER — Other Ambulatory Visit: Payer: Self-pay

## 2024-07-23 ENCOUNTER — Encounter (HOSPITAL_COMMUNITY): Payer: Self-pay | Admitting: *Deleted

## 2024-07-23 ENCOUNTER — Emergency Department (HOSPITAL_COMMUNITY)
Admission: EM | Admit: 2024-07-23 | Discharge: 2024-07-24 | Disposition: A | Discharge status: Home / Self Care | Payer: MEDICAID | Attending: Emergency Medicine | Admitting: Emergency Medicine

## 2024-07-23 DIAGNOSIS — Z91010 Allergy to peanuts: Secondary | ICD-10-CM | POA: Diagnosis not present

## 2024-07-23 DIAGNOSIS — R1084 Generalized abdominal pain: Secondary | ICD-10-CM | POA: Insufficient documentation

## 2024-07-23 DIAGNOSIS — Z5900 Homelessness unspecified: Secondary | ICD-10-CM | POA: Insufficient documentation

## 2024-07-23 DIAGNOSIS — Z91040 Latex allergy status: Secondary | ICD-10-CM | POA: Diagnosis not present

## 2024-07-23 DIAGNOSIS — E876 Hypokalemia: Secondary | ICD-10-CM | POA: Insufficient documentation

## 2024-07-23 DIAGNOSIS — D649 Anemia, unspecified: Secondary | ICD-10-CM | POA: Insufficient documentation

## 2024-07-23 DIAGNOSIS — N3000 Acute cystitis without hematuria: Secondary | ICD-10-CM

## 2024-07-23 LAB — COMPREHENSIVE METABOLIC PANEL WITH GFR
ALT: 22 U/L (ref 0–44)
AST: 42 U/L — ABNORMAL HIGH (ref 15–41)
Albumin: 3.7 g/dL (ref 3.5–5.0)
Alkaline Phosphatase: 79 U/L (ref 38–126)
Anion gap: 15 (ref 5–15)
BUN: 8 mg/dL (ref 6–20)
CO2: 23 mmol/L (ref 22–32)
Calcium: 9.2 mg/dL (ref 8.9–10.3)
Chloride: 102 mmol/L (ref 98–111)
Creatinine, Ser: 0.96 mg/dL (ref 0.44–1.00)
GFR, Estimated: >60 mL/min (ref >60)
Glucose, Bld: 102 mg/dL — ABNORMAL HIGH (ref 70–99)
Potassium: 3.1 mmol/L — ABNORMAL LOW (ref 3.5–5.1)
Sodium: 140 mmol/L (ref 135–145)
Total Bilirubin: 0.6 mg/dL (ref 0.0–1.2)
Total Protein: 7.9 g/dL (ref 6.5–8.1)

## 2024-07-23 LAB — CBC WITH DIFFERENTIAL/PLATELET
Abs Immature Granulocytes: 0.02 K/uL (ref 0.00–0.07)
Basophils Absolute: 0.1 K/uL (ref 0.0–0.1)
Basophils Relative: 1 %
Eosinophils Absolute: 0.2 K/uL (ref 0.0–0.5)
Eosinophils Relative: 3 %
HCT: 35.7 % — ABNORMAL LOW (ref 36.0–46.0)
Hemoglobin: 11.8 g/dL — ABNORMAL LOW (ref 12.0–15.0)
Immature Granulocytes: 0 %
Lymphocytes Relative: 23 %
Lymphs Abs: 1.3 K/uL (ref 0.7–4.0)
MCH: 29.3 pg (ref 26.0–34.0)
MCHC: 33.1 g/dL (ref 30.0–36.0)
MCV: 88.6 fL (ref 80.0–100.0)
Monocytes Absolute: 0.5 K/uL (ref 0.1–1.0)
Monocytes Relative: 9 %
Neutro Abs: 3.6 K/uL (ref 1.7–7.7)
Neutrophils Relative %: 64 %
Platelets: 333 K/uL (ref 150–400)
RBC: 4.03 MIL/uL (ref 3.87–5.11)
RDW: 16.7 % — ABNORMAL HIGH (ref 11.5–15.5)
WBC: 5.8 K/uL (ref 4.0–10.5)
nRBC: 0 % (ref 0.0–0.2)

## 2024-07-23 LAB — ETHANOL: Alcohol, Ethyl (B): 278 mg/dL — ABNORMAL HIGH (ref <15)

## 2024-07-23 LAB — CBG MONITORING, ED: Glucose-Capillary: 88 mg/dL (ref 70–99)

## 2024-07-23 NOTE — ED Provider Triage Note (Signed)
 Emergency Medicine Provider Triage Evaluation Note  Anna [name] , [name] 48 y.o. female  was evaluated in triage.  Pt complains of near syncope, abdominal pain. She reports ongoing abdominal pain for the last week. Associated nausea, vomiting, and diarrhea. No fevers. Denies any sick contacts. Reportedly admitted to alcohol use recently.  Review of Systems  Positive: As above Negative: As above  Physical Exam  BP (!) 160/101   Pulse 97   Temp 98.3 F (36.8 C)   Resp 18   Ht 5' 6 (1.676 m)   Wt 76.2 kg   LMP 04/12/2021 (Approximate)   SpO2 100%   BMI 27.11 kg/m  Gen:   Appears intoxicated and is tearful Resp:  Normal effort  MSK:   Moves extremities without difficulty  Other:  No focal abdominal tenderness or guarding  Medical Decision Making  Medically screening exam initiated at 6:46 PM.  Appropriate orders placed.  Anna [name] was informed that the remainder of the evaluation will be completed by another provider, this initial triage assessment does not replace that evaluation, and the importance of remaining in the ED until their evaluation is complete.     Anna [name] A, PA-C 07/23/24 [phone]

## 2024-07-23 NOTE — ED Triage Notes (Signed)
 A and o x 4  she admits to drinking a cooler  she denies  pain  she reports that she is homeless and she also has anxiety she is keeping both her eyes closed  and trying to fall from the wheelchair.  She attempted to get out of the w/c in the waiting room  and a witnessed reports a soft landing  strong odor of alcohol on her breath

## 2024-07-24 LAB — URINALYSIS, ROUTINE W REFLEX MICROSCOPIC
Bilirubin Urine: NEGATIVE
Glucose, UA: NEGATIVE mg/dL
Ketones, ur: NEGATIVE mg/dL
Nitrite: POSITIVE — AB
Protein, ur: >=300 mg/dL — AB
Specific Gravity, Urine: 1.013 (ref 1.005–1.030)
WBC, UA: >50 WBC/hpf (ref 0–5)
pH: 5 (ref 5.0–8.0)

## 2024-07-24 LAB — RAPID URINE DRUG SCREEN, HOSP PERFORMED
Amphetamines: POSITIVE — AB
Barbiturates: NOT DETECTED
Benzodiazepines: NOT DETECTED
Cocaine: POSITIVE — AB
Opiates: NOT DETECTED
Tetrahydrocannabinol: POSITIVE — AB

## 2024-07-24 MED ORDER — CEPHALEXIN 250 MG PO CAPS: 500.0000 mg | ORAL_CAPSULE | Freq: Once | ORAL | Status: DC

## 2024-07-24 MED ORDER — FOSFOMYCIN TROMETHAMINE 3 G PO PACK
3.0000 g | PACK | Freq: Once | ORAL | Status: AC
  Administered 2024-07-24: 3 g via ORAL
  Filled 2024-07-24: qty 3

## 2024-07-24 MED ORDER — POTASSIUM CHLORIDE CRYS ER 20 MEQ PO TBCR
40.0000 meq | EXTENDED_RELEASE_TABLET | Freq: Once | ORAL | Status: AC
  Administered 2024-07-24: 40 meq via ORAL
  Filled 2024-07-24: qty 2

## 2024-07-24 MED ORDER — ACETAMINOPHEN 325 MG PO TABS: 650.0000 mg | ORAL_TABLET | Freq: Once | ORAL | Status: DC

## 2024-07-24 MED ORDER — IBUPROFEN 400 MG PO TABS
600.0000 mg | ORAL_TABLET | Freq: Once | ORAL | Status: AC
  Administered 2024-07-24: 600 mg via ORAL
  Filled 2024-07-24: qty 1

## 2024-07-24 NOTE — Discharge Instructions (Signed)
 You are seen in the ER today for your abdominal pain and pain with urination.  You have a urinary tract infection for which we administered antibiotics in the emergency department.  This should be sufficient coverage for your urinary tract infection.  Please follow-up with your primary care doctor in 1 week for recheck of your urine and return to the ER with any new severe symptoms.

## 2024-07-24 NOTE — ED Notes (Signed)
Name called for room assignment - no answer 

## 2024-07-24 NOTE — ED Provider Notes (Signed)
 [place] EMERGENCY DEPARTMENT AT [facility] Provider Note   CSN: [id number] Arrival date & time: 07/23/24  1747     Patient presents with: Near Syncope   Anna [name] is a 48 y.o. female homeless patient was initially intoxicated with alcohol when she arrived to the emergency department 9 hours ago.  At this time she is ANO x 4, resting comfortably in the hospital bed.  She endorses generalized abdominal pain, dysuria and urinary frequency for the last few days with left lower back pain and chills.  No known fevers.  Denies hematic area.  On chart review patient with history of abdominal hysterectomy, MDD, polysubstance use.   HPI     Prior to Admission medications   Medication Sig Start Date End Date Taking? Authorizing Provider  acetaminophen -codeine  (TYLENOL  #3) 300-30 MG tablet Take 1 tablet by mouth every 4 (four) hours as needed for pain. 05/24/24     amLODipine  (NORVASC ) 10 MG tablet Take 1 tablet (10 mg total) by mouth daily. 09/10/23   Fleming, Zelda W, NP  cephALEXin  (KEFLEX ) 500 MG capsule Take 1 capsule (500 mg total) by mouth 4 (four) times daily. 07/07/24   Ula Prentice SAUNDERS, MD  chlorhexidine  (PERIDEX ) 0.12 % solution Swish 1 capful for 30 seconds, then spit three times daily. 05/24/24     clindamycin  (CLEOCIN ) 300 MG capsule Take 1 capsule (300 mg total) by mouth 3 (three) times daily with a glass of water . 05/24/24     dexamethasone  (DECADRON ) 2 MG tablet Take 1 tablet (2 mg total) by mouth 3 (three) times daily. 05/24/24     hydrOXYzine  (ATARAX ) 25 MG tablet Take 1 tablet (25 mg total) by mouth every 6 (six) hours. 07/07/24   Ula Prentice SAUNDERS, MD  ibuprofen  (ADVIL ) 400 MG tablet Take 1 tablet (400 mg total) by mouth 4 (four) times daily with food. 05/24/24     ondansetron  (ZOFRAN -ODT) 4 MG disintegrating tablet Take 1 tablet (4 mg total) by mouth every 8 (eight) hours as needed for nausea or vomiting. 07/07/24   Ula Prentice SAUNDERS, MD  sertraline  (ZOLOFT ) 100 MG tablet Take 1  tablet (100 mg total) by mouth daily. 09/10/23   Fleming, Zelda W, NP  traZODone  (DESYREL ) 150 MG tablet Take 1 tablet (150 mg total) by mouth at bedtime as needed for sleep. 09/10/23   Theotis Haze ORN, NP    Allergies: Latex and Peanut-containing drug products    Review of Systems  Constitutional:  Positive for chills. Negative for fatigue.  HENT: Negative.    Gastrointestinal:  Positive for abdominal pain, nausea and vomiting. Negative for diarrhea.  Genitourinary:  Positive for dysuria, flank pain and frequency. Negative for vaginal bleeding, vaginal discharge and vaginal pain.  Neurological: Negative.     Updated Vital Signs BP (!) 159/98   Pulse 89   Temp 98 F (36.7 C) (Oral)   Resp 16   Ht 5' 6 (1.676 m)   Wt 76.2 kg   LMP 04/12/2021 (Approximate)   SpO2 100%   BMI 27.11 kg/m   Physical Exam Vitals and nursing note reviewed.  Constitutional:      Appearance: She is not ill-appearing or toxic-appearing.  HENT:     Head: Normocephalic and atraumatic.     Mouth/Throat:     Mouth: Mucous membranes are moist.     Pharynx: No oropharyngeal exudate or posterior oropharyngeal erythema.  Eyes:     General:        Right eye: No  discharge.        Left eye: No discharge.     Extraocular Movements: Extraocular movements intact.     Conjunctiva/sclera: Conjunctivae normal.     Pupils: Pupils are equal, round, and reactive to light.  Cardiovascular:     Rate and Rhythm: Normal rate and regular rhythm.     Pulses: Normal pulses.     Heart sounds: Normal heart sounds. No murmur heard. Pulmonary:     Effort: Pulmonary effort is normal. No respiratory distress.     Breath sounds: Normal breath sounds. No wheezing or rales.  Abdominal:     General: Bowel sounds are normal. There is no distension.     Palpations: Abdomen is soft.     Tenderness: There is generalized abdominal tenderness. There is no right CVA tenderness, left CVA tenderness, guarding or rebound.   Musculoskeletal:        General: No deformity.     Cervical back: Neck supple.  Skin:    General: Skin is warm and dry.     Capillary Refill: Capillary refill takes less than 2 seconds.  Neurological:     General: No focal deficit present.     Mental Status: She is alert and oriented to person, place, and time. Mental status is at baseline.  Psychiatric:        Mood and Affect: Mood normal.     (all labs ordered are listed, but only abnormal results are displayed) Labs Reviewed  CBC WITH DIFFERENTIAL/PLATELET - Abnormal; Notable for the following components:      Result Value   Hemoglobin 11.8 (*)    HCT 35.7 (*)    RDW 16.7 (*)    All other components within normal limits  COMPREHENSIVE METABOLIC PANEL WITH GFR - Abnormal; Notable for the following components:   Potassium 3.1 (*)    Glucose, Bld 102 (*)    AST 42 (*)    All other components within normal limits  ETHANOL - Abnormal; Notable for the following components:   Alcohol, Ethyl (B) 278 (*)    All other components within normal limits  RAPID URINE DRUG SCREEN, HOSP PERFORMED  URINALYSIS, ROUTINE W REFLEX MICROSCOPIC  CBG MONITORING, ED    EKG: None  Radiology: No results found.   Procedures   Medications Ordered in the ED - No data to display                                  Medical Decision Making 48 y/o female with dysuria.   VS normal on intake, cardiopulmonary exam is unremarkable, abdominal exam with generalized abdominal TTP.   DDX includes but is not limited to cystitis, pyelonephritis, nephro-/ureterolithiasis, STI.   Amount and/or Complexity of Data Reviewed Labs: ordered.    Details: CBC with anemia with hemoglobin 11.8.  CMP with hypokalemia 3.1.  EtOH was 278. UA with UTI, urine culture pending.   Risk Prescription drug management.   Clinical patient was consistent with urinary tract infection.  Due to patient's housing instability and inability to afford outpatient antibiotics we  will proceed with antibiotics with fosfomycin in the emergency department.  Urine culture is pending.  Recommend close outpatient follow-up in 1 week with her primary care doctor or one of her medical for reevaluation of her urine and symptoms.  Strict return precautions were given.  Clinical concern for emergent underlying condition that would warrant further ED workup and patient  management is exceedingly low  Lynden  voiced understanding of her medical evaluation and treatment plan. Each of their questions answered to their expressed satisfaction.  Return precautions were given.  Patient is well-appearing, stable, and was discharged in good condition.  This chart was dictated using voice recognition software, Dragon. Despite the best efforts of this provider to proofread and correct errors, errors may still occur which can change documentation meaning.      Final diagnoses:  None    ED Discharge Orders     None          Bobette Pleasant JONELLE DEVONNA 07/24/24 [id number]    Palumbo, April, MD 07/24/24 [id number]

## 2024-07-24 NOTE — ED Notes (Signed)
 ..  The patient is A&OX4, ambulatory at d/c with independent steady gait, NAD. Pt verbalized understanding of d/c instructions and follow up care.

## 2024-07-26 ENCOUNTER — Other Ambulatory Visit (HOSPITAL_COMMUNITY): Payer: Self-pay

## 2024-07-26 LAB — URINE CULTURE: Culture: >=100000 — AB

## 2024-07-27 ENCOUNTER — Telehealth (HOSPITAL_BASED_OUTPATIENT_CLINIC_OR_DEPARTMENT_OTHER): Payer: Self-pay | Admitting: *Deleted

## 2024-07-27 NOTE — Telephone Encounter (Signed)
 Post ED Visit - Positive Culture Follow-up  Culture report reviewed by antimicrobial stewardship pharmacist: [facility] [x]  Feliciano Close, Pharm.D. []  Venetia Gully, Pharm.D., BCPS AQ-ID []  Garrel Crews, Pharm.D., BCPS []  Almarie Lunger, Pharm.D., BCPS []  [place], [place].D., BCPS, AAHIVP []  Rosaline Bihari, Pharm.D., BCPS, AAHIVP []  Vernell Meier, PharmD, BCPS []  Latanya Hint, PharmD, BCPS []  Donald Medley, PharmD, BCPS []  Rocky Bold, PharmD []  Dorothyann Alert, PharmD, BCPS []  Morene Babe, PharmD  [facility] []  Rosaline Edison, PharmD []  Romona Bliss, PharmD []  Dolphus Roller, PharmD []  Veva Seip, Rph []  Vernell Daunt) Leonce, PharmD []  Eva Allis, PharmD []  Rosaline Millet, PharmD []  Iantha Batch, PharmD []  Arvin Gauss, PharmD []  Wanda Hasting, PharmD []  Ronal Rav, PharmD []  Rocky Slade, PharmD []  Bard Jeans, PharmD   Positive urine culture Treated with Fosfomycin in the ED, organism sensitive to the same and no further patient follow-up is required at this time.  Lorita Barnie Pereyra 07/27/2024, 9:44 AM

## 2024-08-27 ENCOUNTER — Emergency Department (HOSPITAL_COMMUNITY)
Admission: EM | Admit: 2024-08-27 | Discharge: 2024-08-28 | Discharge status: Against Medical Advice | Payer: MEDICAID | Attending: Emergency Medicine | Admitting: Emergency Medicine

## 2024-08-27 DIAGNOSIS — Z5321 Procedure and treatment not carried out due to patient leaving prior to being seen by health care provider: Secondary | ICD-10-CM | POA: Diagnosis not present

## 2024-08-27 DIAGNOSIS — R21 Rash and other nonspecific skin eruption: Secondary | ICD-10-CM | POA: Insufficient documentation

## 2024-08-27 NOTE — ED Triage Notes (Signed)
 The pt is sitting and crying she has pulled off her bracelet and has thrown it in the floor  she will not answer any questions asked she just sits and cries

## 2024-08-27 NOTE — ED Triage Notes (Signed)
 Pt in with reported facial pain and rash ongoing for days - states she stays at a shelter and thinks she was bitten by something.

## 2024-08-27 NOTE — ED Triage Notes (Signed)
 Not co-operating back to the waiting room

## 2024-08-28 ENCOUNTER — Encounter (HOSPITAL_COMMUNITY): Payer: Self-pay

## 2024-08-28 ENCOUNTER — Ambulatory Visit (HOSPITAL_COMMUNITY)
Admission: EM | Admit: 2024-08-28 | Discharge: 2024-08-28 | Disposition: A | Discharge status: Home / Self Care | Payer: MEDICAID | Attending: Physician Assistant | Admitting: Physician Assistant

## 2024-08-28 DIAGNOSIS — R300 Dysuria: Secondary | ICD-10-CM | POA: Diagnosis not present

## 2024-08-28 DIAGNOSIS — R21 Rash and other nonspecific skin eruption: Secondary | ICD-10-CM

## 2024-08-28 DIAGNOSIS — N3000 Acute cystitis without hematuria: Secondary | ICD-10-CM | POA: Diagnosis not present

## 2024-08-28 LAB — POCT URINALYSIS DIP (MANUAL ENTRY)
Bilirubin, UA: NEGATIVE
Glucose, UA: NEGATIVE mg/dL
Ketones, POC UA: NEGATIVE mg/dL
Nitrite, UA: POSITIVE — AB
Protein Ur, POC: 100 mg/dL — AB
Spec Grav, UA: <=1.005 — AB (ref 1.010–1.025)
Urobilinogen, UA: 0.2 U/dL
pH, UA: 6 (ref 5.0–8.0)

## 2024-08-28 MED ORDER — FOSFOMYCIN TROMETHAMINE 3 G PO PACK
3.0000 g | PACK | Freq: Once | ORAL | Status: AC
  Administered 2024-08-28: 3 g via ORAL

## 2024-08-28 NOTE — ED Notes (Signed)
 Called pt 3 times for vital checks ..no response

## 2024-08-28 NOTE — ED Provider Notes (Signed)
 [facility]    CSN: [id number] Arrival date & time: 08/28/24  1203      History   Chief Complaint Chief Complaint  Patient presents with   Rash   Dysuria    HPI Anna [name] is a 48 y.o. female.   Patient presents with 2 days of dysuria.  She denies abdominal pain, hematuria, fever, chills.  She had similar symptoms about 1 month ago treated in the ED.  Patient also complains of a rash above her left eyebrow and to left jawline.  She complains of itching to these areas.    Past Medical History:  Diagnosis Date   Anemia    Anxiety    Hypertension    only elevated at MD office per pt. no meds    Patient Active Problem List   Diagnosis Date Noted   Iron deficiency anemia due to chronic blood loss 09/10/2023   MDD (major depressive disorder), recurrent severe, without psychosis (HCC) 05/30/2022   History of total abdominal hysterectomy 06/20/2021   History of left oophorectomy 06/20/2021   Post-operative state 05/22/2021   Visit for routine gyn exam 05/09/2021   Fibroids 05/09/2021   Symptomatic anemia 04/20/2021   Microscopic hematuria 04/20/2021   Vaginal bleeding 03/21/2020   Depression 03/21/2020   Substance induced mood disorder (HCC) 03/29/2019   Menorrhagia with regular cycle 03/26/2019   Cocaine substance abuse (HCC) 03/26/2019    Past Surgical History:  Procedure Laterality Date   ANKLE FRACTURE SURGERY Right    HYSTERECTOMY ABDOMINAL WITH SALPINGECTOMY Bilateral 05/22/2021   Procedure: HYSTERECTOMY ABDOMINAL WITH SALPINGECTOMY AND LEFT OOPHORECTOMY;  Surgeon: Lorence Ozell CROME, MD;  Location: [facility];  Service: Gynecology;  Laterality: Bilateral;  TAP BLOCK    OB History     Gravida  6   Para  3   Term  3   Preterm  0   AB  3   Living  3      SAB  0   IAB  3   Ectopic  0   Multiple  0   Live Births  3            Home Medications    Prior to Admission medications   Medication Sig Start Date End Date Taking?  Authorizing Provider  acetaminophen -codeine  (TYLENOL  #3) 300-30 MG tablet Take 1 tablet by mouth every 4 (four) hours as needed for pain. 05/24/24     amLODipine  (NORVASC ) 10 MG tablet Take 1 tablet (10 mg total) by mouth daily. 09/10/23   Fleming, Zelda W, NP  cephALEXin  (KEFLEX ) 500 MG capsule Take 1 capsule (500 mg total) by mouth 4 (four) times daily. 07/07/24   Ula Prentice SAUNDERS, MD  chlorhexidine  (PERIDEX ) 0.12 % solution Swish 1 capful for 30 seconds, then spit three times daily. 05/24/24     clindamycin  (CLEOCIN ) 300 MG capsule Take 1 capsule (300 mg total) by mouth 3 (three) times daily with a glass of water . 05/24/24     dexamethasone  (DECADRON ) 2 MG tablet Take 1 tablet (2 mg total) by mouth 3 (three) times daily. 05/24/24     hydrOXYzine  (ATARAX ) 25 MG tablet Take 1 tablet (25 mg total) by mouth every 6 (six) hours. 07/07/24   Ula Prentice SAUNDERS, MD  ibuprofen  (ADVIL ) 400 MG tablet Take 1 tablet (400 mg total) by mouth 4 (four) times daily with food. 05/24/24     ondansetron  (ZOFRAN -ODT) 4 MG disintegrating tablet Take 1 tablet (4 mg total) by mouth every 8 (  eight) hours as needed for nausea or vomiting. 07/07/24   Ula Prentice SAUNDERS, MD  sertraline  (ZOLOFT ) 100 MG tablet Take 1 tablet (100 mg total) by mouth daily. 09/10/23   Fleming, Zelda W, NP  traZODone  (DESYREL ) 150 MG tablet Take 1 tablet (150 mg total) by mouth at bedtime as needed for sleep. 09/10/23   Theotis Haze ORN, NP    Family History Family History  Problem Relation Age of Onset   Hypertension Mother    Breast cancer Maternal Aunt 50   Heart failure Maternal Grandfather    Colon cancer Neg Hx    Colon polyps Neg Hx    Esophageal cancer Neg Hx    Rectal cancer Neg Hx    Stomach cancer Neg Hx     Social History Social History   Tobacco Use   Smoking status: Former    Types: Cigarettes   Smokeless tobacco: Never   Tobacco comments:    pt states quit 1-2 years ago   Vaping Use   Vaping status: Never Used  Substance Use Topics    Alcohol use: Yes    Comment: occassionally, not often   Drug use: Yes    Types: Cocaine, Marijuana    Comment: occasional cocaine use per availability; cannabis use 2x weekly     Allergies   Latex and Peanut-containing drug products   Review of Systems Review of Systems  Constitutional:  Negative for chills and fever.  HENT:  Negative for ear pain and sore throat.   Eyes:  Negative for pain and visual disturbance.  Respiratory:  Negative for cough and shortness of breath.   Cardiovascular:  Negative for chest pain and palpitations.  Gastrointestinal:  Negative for abdominal pain and vomiting.  Genitourinary:  Positive for dysuria. Negative for hematuria.  Musculoskeletal:  Negative for arthralgias and back pain.  Skin:  Positive for rash. Negative for color change.  Neurological:  Negative for seizures and syncope.  All other systems reviewed and are negative.    Physical Exam Triage Vital Signs ED Triage Vitals [08/28/24 1321]  Encounter Vitals Group     BP (!) 145/98     Girls Systolic BP Percentile      Girls Diastolic BP Percentile      Boys Systolic BP Percentile      Boys Diastolic BP Percentile      Pulse Rate 85     Resp 18     Temp 98.7 F (37.1 C)     Temp Source Oral     SpO2 98 %     Weight      Height      Head Circumference      Peak Flow      Pain Score      Pain Loc      Pain Education      Exclude from Growth Chart    No data found.  Updated Vital Signs BP (!) 145/98 (BP Location: Left Arm)   Pulse 85   Temp 98.7 F (37.1 C) (Oral)   Resp 18   LMP 04/12/2021 (Approximate)   SpO2 98%   Visual Acuity Right Eye Distance:   Left Eye Distance:   Bilateral Distance:    Right Eye Near:   Left Eye Near:    Bilateral Near:     Physical Exam Vitals and nursing note reviewed.  Constitutional:      General: She is not in acute distress.    Appearance: She is well-developed.  HENT:     Head: Normocephalic and atraumatic.     Comments:  Vesicular rash above the left eyebrow, healing similar rash to left jawline. Eyes:     Conjunctiva/sclera: Conjunctivae normal.  Cardiovascular:     Rate and Rhythm: Normal rate and regular rhythm.     Heart sounds: No murmur heard. Pulmonary:     Effort: Pulmonary effort is normal. No respiratory distress.     Breath sounds: Normal breath sounds.  Abdominal:     Palpations: Abdomen is soft.     Tenderness: There is no abdominal tenderness.  Musculoskeletal:        General: No swelling.     Cervical back: Neck supple.  Skin:    General: Skin is warm and dry.     Capillary Refill: Capillary refill takes less than 2 seconds.  Neurological:     Mental Status: She is alert.  Psychiatric:        Mood and Affect: Mood normal.      UC Treatments / Results  Labs (all labs ordered are listed, but only abnormal results are displayed) Labs Reviewed  POCT URINALYSIS DIP (MANUAL ENTRY) - Abnormal; Notable for the following components:      Result Value   Clarity, UA cloudy (*)    Spec Grav, UA <=1.005 (*)    Blood, UA small (*)    Protein Ur, POC =100 (*)    Nitrite, UA Positive (*)    Leukocytes, UA Large (3+) (*)    All other components within normal limits    EKG   Radiology No results found.  Procedures Procedures (including critical care time)  Medications Ordered in UC Medications  fosfomycin (MONUROL ) packet 3 g (3 g Oral Given 08/28/24 1415)    Initial Impression / Assessment and Plan / UC Course  I have reviewed the triage vital signs and the nursing notes.  Pertinent labs & imaging results that were available during my care of the patient were reviewed by me and considered in my medical decision making (see chart for details).     Will treat for UTI.  Fosfomycin given in clinic today as patient reports she cannot pick up prescriptions/cannot afford them.  Advised to return if no improvement. Rash above her eyebrow is vesicular but I do not think it is  shingles due to similar rash to jawline, this is not the same dermatome.  Patient given bacitracin to apply twice daily.  Return if no improvement. Final Clinical Impressions(s) / UC Diagnoses   Final diagnoses:  Dysuria  Acute cystitis without hematuria  Rash and nonspecific skin eruption     Discharge Instructions      You were given an antibiotic in clinic today.  Please return if no improvement of urinary symptoms.  Apply ointment to the rash two times per day.    ED Prescriptions   None    PDMP not reviewed this encounter.   Ward, Harlene PEDLAR, PA-C 08/28/24 1419

## 2024-08-28 NOTE — Discharge Instructions (Addendum)
 You were given an antibiotic in clinic today.  Please return if no improvement of urinary symptoms.  Apply ointment to the rash two times per day.

## 2024-08-28 NOTE — ED Triage Notes (Signed)
 Patient presents to the office for dysuria x 2 days. Patient also reports a rash on her right cheek. Patient has not taken any medication to help with symptoms.

## 2024-09-20 ENCOUNTER — Ambulatory Visit (HOSPITAL_COMMUNITY): Admit: 2024-09-20 | Discharge: 2024-09-20 | Discharge status: Against Medical Advice | Payer: MEDICAID

## 2024-09-20 NOTE — ED Triage Notes (Signed)
 Called no answer

## 2024-09-20 NOTE — ED Notes (Signed)
 Called pt from lobby x 3 no answer

## 2024-09-20 NOTE — ED Notes (Signed)
 Called pt from lobby no answer X 2

## 2024-09-21 ENCOUNTER — Ambulatory Visit: Payer: Self-pay

## 2024-09-21 ENCOUNTER — Other Ambulatory Visit: Payer: Self-pay

## 2024-09-21 ENCOUNTER — Emergency Department (HOSPITAL_COMMUNITY): Admission: EM | Admit: 2024-09-21 | Discharge: 2024-09-21 | Discharge status: Against Medical Advice | Payer: MEDICAID

## 2024-09-21 ENCOUNTER — Other Ambulatory Visit (HOSPITAL_COMMUNITY): Payer: Self-pay

## 2024-09-21 DIAGNOSIS — Z91040 Latex allergy status: Secondary | ICD-10-CM | POA: Diagnosis not present

## 2024-09-21 DIAGNOSIS — R21 Rash and other nonspecific skin eruption: Secondary | ICD-10-CM | POA: Diagnosis present

## 2024-09-21 DIAGNOSIS — Z91010 Allergy to peanuts: Secondary | ICD-10-CM | POA: Diagnosis not present

## 2024-09-21 DIAGNOSIS — L309 Dermatitis, unspecified: Secondary | ICD-10-CM | POA: Diagnosis not present

## 2024-09-21 MED ORDER — METHYLPREDNISOLONE 4 MG PO TBPK
ORAL_TABLET | ORAL | 0 refills | Status: DC
  Filled 2024-09-21 (×2): qty 21, 6d supply, fill #0

## 2024-09-21 MED ORDER — HYDROXYZINE HCL 25 MG PO TABS: 25.0000 mg | ORAL_TABLET | Freq: Four times a day (QID) | ORAL | 0 refills | Status: DC | PRN

## 2024-09-21 NOTE — Discharge Instructions (Addendum)
 Contact a health care provider if: Your condition does not get better with treatment. Your condition gets worse. You have any signs of infection. You have a fever. You have new symptoms. Your bone or joint under the affected area becomes painful after the skin has healed. Get help right away if: You notice red streaks coming from the affected area. The affected area turns darker. You have trouble breathing.

## 2024-09-21 NOTE — Telephone Encounter (Signed)
 Noted. Patient seen in ED.

## 2024-09-21 NOTE — ED Provider Notes (Signed)
 [place] EMERGENCY DEPARTMENT AT [facility] Provider Note   CSN: [id number] Arrival date & time: 09/21/24  1508     Patient presents with: Rash   Anna [name] is a 48 y.o. female who presents emergency department with a chief complaint of rash.  She had onset of the rash for the past 2 weeks.  Is appears to be present on her face scalp and neck.  She denies any new lotions soaps hair dyes shampoos.  She states that it is intensely itchy.  She was given topical treatment states it has not been working she denies any new medications.  She denies involvement of her eyes, mouth or other mucosal surfaces.    Rash      Prior to Admission medications   Medication Sig Start Date End Date Taking? Authorizing Provider  acetaminophen -codeine  (TYLENOL  #3) 300-30 MG tablet Take 1 tablet by mouth every 4 (four) hours as needed for pain. 05/24/24     amLODipine  (NORVASC ) 10 MG tablet Take 1 tablet (10 mg total) by mouth daily. 09/10/23   Fleming, Zelda W, NP  cephALEXin  (KEFLEX ) 500 MG capsule Take 1 capsule (500 mg total) by mouth 4 (four) times daily. 07/07/24   Ula Prentice SAUNDERS, MD  chlorhexidine  (PERIDEX ) 0.12 % solution Swish 1 capful for 30 seconds, then spit three times daily. 05/24/24     clindamycin  (CLEOCIN ) 300 MG capsule Take 1 capsule (300 mg total) by mouth 3 (three) times daily with a glass of water . 05/24/24     dexamethasone  (DECADRON ) 2 MG tablet Take 1 tablet (2 mg total) by mouth 3 (three) times daily. 05/24/24     hydrOXYzine  (ATARAX ) 25 MG tablet Take 1 tablet (25 mg total) by mouth every 6 (six) hours. 07/07/24   Ula Prentice SAUNDERS, MD  ibuprofen  (ADVIL ) 400 MG tablet Take 1 tablet (400 mg total) by mouth 4 (four) times daily with food. 05/24/24     ondansetron  (ZOFRAN -ODT) 4 MG disintegrating tablet Take 1 tablet (4 mg total) by mouth every 8 (eight) hours as needed for nausea or vomiting. 07/07/24   Ula Prentice SAUNDERS, MD  sertraline  (ZOLOFT ) 100 MG tablet Take 1 tablet (100 mg  total) by mouth daily. 09/10/23   Fleming, Zelda W, NP  traZODone  (DESYREL ) 150 MG tablet Take 1 tablet (150 mg total) by mouth at bedtime as needed for sleep. 09/10/23   Theotis Haze ORN, NP    Allergies: Latex and Peanut-containing drug products    Review of Systems  Skin:  Positive for rash.    Updated Vital Signs BP (!) 146/98 (BP Location: Right Arm)   Pulse 87   Temp 98.3 F (36.8 C) (Oral)   Resp 16   LMP 04/12/2021 (Approximate)   SpO2 98%   Physical Exam Vitals and nursing note reviewed.  Constitutional:      General: She is not in acute distress.    Appearance: She is well-developed. She is not diaphoretic.  HENT:     Head: Normocephalic and atraumatic.     Right Ear: External ear normal.     Left Ear: External ear normal.     Nose: Nose normal.     Mouth/Throat:     Mouth: Mucous membranes are moist.  Eyes:     General: No scleral icterus.    Conjunctiva/sclera: Conjunctivae normal.  Cardiovascular:     Rate and Rhythm: Normal rate and regular rhythm.     Heart sounds: Normal heart sounds. No murmur heard.  No friction rub. No gallop.  Pulmonary:     Effort: Pulmonary effort is normal. No respiratory distress.     Breath sounds: Normal breath sounds.  Abdominal:     General: Bowel sounds are normal. There is no distension.     Palpations: Abdomen is soft. There is no mass.     Tenderness: There is no abdominal tenderness. There is no guarding.  Musculoskeletal:     Cervical back: Normal range of motion.  Skin:    General: Skin is warm and dry.     Comments: Fuhs erythematous peeling rash involving the face scalp neck and chest, there are areas of confluence and swelling without evidence of secondary infection or cellulitis.  Neurological:     Mental Status: She is alert and oriented to person, place, and time.  Psychiatric:        Behavior: Behavior normal.     (all labs ordered are listed, but only abnormal results are displayed) Labs Reviewed -  No data to display  EKG: None  Radiology: No results found.   Procedures   Medications Ordered in the ED - No data to display                                  Medical Decision Making Risk Prescription drug management.  Patient with an eczematous appearing dermatitis.  My high suspicion is for some type of contact dermatitis however patient denies knowing what might of caused this.  Will discharge with Atarax  and steroid taper.  She is advised to follow closely with her primary care doctor.  She has no mucosal surface involvement suggestive of something like SJS or TENS.  She has no signs of cellulitis.  Keepers appropriate for discharge at this time.     Final diagnoses:  None    ED Discharge Orders     None          Arloa Chroman, PA-C 09/21/24 1602    Gennaro Duwaine CROME, DO 09/22/24 [phone]

## 2024-09-21 NOTE — ED Triage Notes (Signed)
 Pt presents to ED for a rash to face and chest x 2 weeks that is spreading. Scalp itches as well. She has been scratching chest to the point of broken skin. She is not sure what she is reacting to.

## 2024-09-21 NOTE — Telephone Encounter (Signed)
 FYI Only or Action Required?: FYI only for provider.  Patient was last seen in primary care on 09/10/2023 by Theotis Haze ORN, NP.  Called Nurse Triage reporting Rash.  Symptoms began several weeks ago.  Interventions attempted: Nothing.  Symptoms are: gradually worsening.  Triage Disposition: Go to ED Now (or PCP Triage)  Patient/caregiver understands and will follow disposition?: YesCopied from CRM #[id number]. Topic: Clinical - Red Word Triage >> Sep 21, 2024  1:51 PM Darshell M wrote: Red Word that prompted transfer to Nurse Triage: Breaking Hives/Rash on face, chest, breaking out, Itching and it is spreading. Last two weeks but now worsening. Reason for Disposition  Patient sounds very sick or weak to the triager  Answer Assessment - Initial Assessment Questions Pt just kept repeating while standing in hospital, it's bad. It's bad. I don't know what this is. Maybe my homies gave me something? Rash on face, chest, and ears. Pt had taken bus to ED but called triage before signing in. RN advised pt to seek care there and to call back to make follow up with PCP.     1. APPEARANCE of RASH: What does the rash look like? (e.g., blisters, dry flaky skin, red spots, redness, sores)     unsure 2. SIZE: How big are the spots? (e.g., tip of pen, eraser, coin; inches, centimeters)     na 3. LOCATION: Where is the rash located?     Face, chest, ears 4. COLOR: What color is the rash? (Note: It is difficult to assess rash color in people with darker-colored skin. When this situation occurs, simply ask the caller to describe what they see.)     na 5. ONSET: When did the rash begin?     Several weeks 6. FEVER: Do you have a fever? If Yes, ask: What is your temperature, how was it measured, and when did it start?     denies 7. ITCHING: Does the rash itch? If Yes, ask: How bad is the itch? (Scale 1-10; or mild, moderate, severe)     severe 8. CAUSE: What do you think is  causing the rash?     Not sure 9. MEDICINE FACTORS: Have you started any new medicines within the last 2 weeks? (e.g., antibiotics)      na 10. OTHER SYMPTOMS: Do you have any other symptoms? (e.g., dizziness, headache, sore throat, joint pain)       It's bad. I just don't feel good.  Protocols used: Rash or Redness - [facility]

## 2024-09-30 ENCOUNTER — Emergency Department (HOSPITAL_COMMUNITY)
Admission: EM | Admit: 2024-09-30 | Discharge: 2024-09-30 | Disposition: A | Discharge status: Home / Self Care | Payer: MEDICAID | Attending: Emergency Medicine | Admitting: Emergency Medicine

## 2024-09-30 ENCOUNTER — Other Ambulatory Visit: Payer: Self-pay

## 2024-09-30 ENCOUNTER — Encounter (HOSPITAL_COMMUNITY): Payer: Self-pay

## 2024-09-30 DIAGNOSIS — I10 Essential (primary) hypertension: Secondary | ICD-10-CM | POA: Insufficient documentation

## 2024-09-30 DIAGNOSIS — Z79899 Other long term (current) drug therapy: Secondary | ICD-10-CM | POA: Insufficient documentation

## 2024-09-30 DIAGNOSIS — Z91040 Latex allergy status: Secondary | ICD-10-CM | POA: Diagnosis not present

## 2024-09-30 DIAGNOSIS — R21 Rash and other nonspecific skin eruption: Secondary | ICD-10-CM | POA: Diagnosis present

## 2024-09-30 DIAGNOSIS — Z91010 Allergy to peanuts: Secondary | ICD-10-CM | POA: Insufficient documentation

## 2024-09-30 MED ORDER — NYSTATIN 100000 UNIT/GM EX POWD
1.0000 | Freq: Three times a day (TID) | CUTANEOUS | 0 refills | Status: DC
  Filled 2024-09-30: qty 15, 5d supply, fill #0

## 2024-09-30 MED ORDER — AMLODIPINE BESYLATE 5 MG PO TABS
10.0000 mg | ORAL_TABLET | Freq: Once | ORAL | Status: AC
  Administered 2024-09-30: 10 mg via ORAL
  Filled 2024-09-30: qty 2

## 2024-09-30 MED ORDER — HYDROXYZINE HCL 25 MG PO TABS
25.0000 mg | ORAL_TABLET | Freq: Once | ORAL | Status: AC
  Administered 2024-09-30: 25 mg via ORAL
  Filled 2024-09-30: qty 1

## 2024-09-30 MED ORDER — PREDNISONE 10 MG PO TABS
ORAL_TABLET | ORAL | 0 refills | Status: AC
Start: 2024-09-30 — End: 2024-10-12
  Filled 2024-09-30: qty 42, 12d supply, fill #0

## 2024-09-30 NOTE — Discharge Instructions (Addendum)
 You were seen in the ER today for evaluation of your rash. I'm going to prescribe you a steroid taper and some nystatin powder for under your breasts. Please make sure that you follow up with your PCP and a dermatologist. I have included the information for a dermatologist to your discharge paperwork for you to call to follow up with. Try to keep you clothing as skin clean to avoid re-exposure to the area. If you have any concerns, new or worsening symptoms, please return to the nearest ER for re-evaluation.   Contact a health care provider if: You sweat at night more than normal. You pee (urinate) more or less than normal, or your pee is a darker color than normal. Your eyes become sensitive to light. Your skin or the white parts of your eyes turn yellow (jaundice). Your skin tingles or is numb. You get painful blisters in your nose or mouth. Your rash does not go away after a few days, or it gets worse. You are more tired or thirsty than normal. You have new or worse symptoms. These may include: Pain in your abdomen. Fever. Diarrhea or vomiting. Weakness or weight loss. Get help right away if: You get confused. You have a severe headache, a stiff neck, or severe joint pain or stiffness. You become very sleepy or not responsive. You have a seizure.

## 2024-09-30 NOTE — ED Triage Notes (Signed)
 Pt came in via POV d/t having a break out on her skin to the face & down to her chest. States it almost cleared up then it started back spreading back on her face again. A/Ox4, rates her pain 10/10.

## 2024-09-30 NOTE — ED Provider Notes (Signed)
 [place] EMERGENCY DEPARTMENT AT [facility] Provider Note   CSN: [id number] Arrival date & time: 09/30/24  1420     Patient presents with: Rash to face/chest   Anna [name] is a 48 y.o. female.  {Add pertinent medical, surgical, social history, OB history to HPI:32947} HPI     Prior to Admission medications   Medication Sig Start Date End Date Taking? Authorizing Provider  nystatin (MYCOSTATIN/NYSTOP) powder Apply 1 Application topically 3 (three) times daily. 09/30/24  Yes Bernis Ernst, PA-C  predniSONE  (STERAPRED UNI-PAK 21 TAB) 10 MG (21) TBPK tablet Take by mouth daily. Take 6 tabs by mouth daily  for 2 days, then 5 tabs for 2 days, then 4 tabs for 2 days, then 3 tabs for 2 days, 2 tabs for 2 days, then 1 tab by mouth daily for 2 days 09/30/24  Yes Bernis Ernst, PA-C  acetaminophen -codeine  (TYLENOL  #3) 300-30 MG tablet Take 1 tablet by mouth every 4 (four) hours as needed for pain. 05/24/24     amLODipine  (NORVASC ) 10 MG tablet Take 1 tablet (10 mg total) by mouth daily. 09/10/23   Fleming, Zelda W, NP  cephALEXin  (KEFLEX ) 500 MG capsule Take 1 capsule (500 mg total) by mouth 4 (four) times daily. 07/07/24   Ula Prentice SAUNDERS, MD  chlorhexidine  (PERIDEX ) 0.12 % solution Swish 1 capful for 30 seconds, then spit three times daily. 05/24/24     clindamycin  (CLEOCIN ) 300 MG capsule Take 1 capsule (300 mg total) by mouth 3 (three) times daily with a glass of water . 05/24/24     hydrOXYzine  (ATARAX ) 25 MG tablet Take 1 tablet (25 mg total) by mouth every 6 (six) hours. 07/07/24   Ula Prentice SAUNDERS, MD  hydrOXYzine  (ATARAX ) 25 MG tablet Take 1 tablet (25 mg total) by mouth every 6 (six) hours as needed for itching. 09/21/24   Harris, Abigail, PA-C  ibuprofen  (ADVIL ) 400 MG tablet Take 1 tablet (400 mg total) by mouth 4 (four) times daily with food. 05/24/24     ondansetron  (ZOFRAN -ODT) 4 MG disintegrating tablet Take 1 tablet (4 mg total) by mouth every 8 (eight) hours as needed for  nausea or vomiting. 07/07/24   Ula Prentice SAUNDERS, MD  sertraline  (ZOLOFT ) 100 MG tablet Take 1 tablet (100 mg total) by mouth daily. 09/10/23   Fleming, Zelda W, NP  traZODone  (DESYREL ) 150 MG tablet Take 1 tablet (150 mg total) by mouth at bedtime as needed for sleep. 09/10/23   Theotis Haze ORN, NP    Allergies: Latex and Peanut-containing drug products    Review of Systems  Updated Vital Signs BP (!) 164/121   Pulse 92   Temp 97.9 F (36.6 C) (Oral)   Resp 16   Ht 5' 7 (1.702 m)   Wt 74.8 kg   LMP 04/12/2021 (Approximate)   SpO2 100%   BMI 25.84 kg/m   Physical Exam  (all labs ordered are listed, but only abnormal results are displayed) Labs Reviewed - No data to display  EKG: None  Radiology: No results found.  {Document cardiac monitor, telemetry assessment procedure when appropriate:32947} Procedures   Medications Ordered in the ED  amLODipine  (NORVASC ) tablet 10 mg (has no administration in time range)  hydrOXYzine  (ATARAX ) tablet 25 mg (has no administration in time range)      {Click here for ABCD2, HEART and other calculators REFRESH Note before signing:1}  Medical Decision Making Risk Prescription drug management.   ***  {Document critical care time when appropriate  Document review of labs and clinical decision tools ie CHADS2VASC2, etc  Document your independent review of radiology images and any outside records  Document your discussion with family members, caretakers and with consultants  Document social determinants of health affecting pt's care  Document your decision making why or why not admission, treatments were needed:32947:::1}   Final diagnoses:  Rash    ED Discharge Orders          Ordered    nystatin (MYCOSTATIN/NYSTOP) powder  3 times daily        09/30/24 1456    predniSONE  (STERAPRED UNI-PAK 21 TAB) 10 MG (21) TBPK tablet  Daily        09/30/24 1456

## 2024-10-07 ENCOUNTER — Ambulatory Visit: Payer: Self-pay

## 2024-10-07 NOTE — Telephone Encounter (Signed)
 Needs office visit. Can DB an 1110

## 2024-10-07 NOTE — Telephone Encounter (Signed)
 FYI Only or Action Required?: Action required by provider: request for appointment. Prefers tomorrow or Monday d/t transportation.   Patient was last seen in primary care on 09/10/2023 by Theotis Haze ORN, NP.  Called Nurse Triage reporting Rash.  Symptoms began several weeks ago.  Interventions attempted: steroids.  Symptoms are: gradually worsening.  Triage Disposition: See PCP When Office is Open (Within 3 Days)  Patient/caregiver understands and will follow disposition?: Yes, will follow disposition  Copied from CRM #[id number]. Topic: Clinical - Red Word Triage >> Oct 07, 2024  2:10 PM Delon HERO wrote: Red Word that prompted transfer to Nurse Triage: Patient is calling to report a rash that is getting worse - went to the ED, 09/30/2024,09/21/2024 reporting that it is getting worse - four more days of medication.  Reporting that her face is raw on her face & chest burning down to white meat that itches. Reason for Disposition  [1] Severe localized itching AND [2] after 2 days of steroid cream  Answer Assessment - Initial Assessment Questions 1. APPEARANCE of RASH: What does the rash look like? (e.g., blisters, dry flaky skin, red spots, redness, sores)     Down to white meat, hurts, itches, irritating 2. LOCATION: Where is the rash located?      Under breast 3. NUMBER: How many spots are there?      multiple 5. ONSET: When did the rash start?      Ongoing for weeks 6. ITCHING: Does the rash itch? If Yes, ask: How bad is the itch?  (Scale 0-10; or none, mild, moderate, severe)     severe 7. PAIN: Does the rash hurt? If Yes, ask: How bad is the pain?  (Scale 0-10; or none, mild, moderate, severe)     moderate 8. OTHER SYMPTOMS: Do you have any other symptoms? (e.g., fever)     denies  Pt states that she has been seen in the ED twice for this and was given steroids for this both times. Pt states that it is not helping, asking for a specialist. Pt states she  does the best she can with cleaning and keeping it dry. Pt offered mobile bus, but does not have transportation to get there today.  Protocols used: Rash or Redness - Localized-A-AH

## 2024-10-11 NOTE — Telephone Encounter (Signed)
 Noted

## 2024-10-14 ENCOUNTER — Encounter (HOSPITAL_COMMUNITY): Payer: Self-pay

## 2024-10-14 ENCOUNTER — Emergency Department (HOSPITAL_COMMUNITY)
Admission: EM | Admit: 2024-10-14 | Discharge: 2024-10-14 | Disposition: A | Discharge status: Home / Self Care | Payer: MEDICAID

## 2024-10-14 ENCOUNTER — Other Ambulatory Visit: Payer: Self-pay

## 2024-10-14 ENCOUNTER — Other Ambulatory Visit (HOSPITAL_COMMUNITY): Payer: Self-pay

## 2024-10-14 DIAGNOSIS — Z91040 Latex allergy status: Secondary | ICD-10-CM | POA: Insufficient documentation

## 2024-10-14 DIAGNOSIS — Z79899 Other long term (current) drug therapy: Secondary | ICD-10-CM | POA: Insufficient documentation

## 2024-10-14 DIAGNOSIS — I10 Essential (primary) hypertension: Secondary | ICD-10-CM | POA: Insufficient documentation

## 2024-10-14 DIAGNOSIS — L309 Dermatitis, unspecified: Secondary | ICD-10-CM | POA: Insufficient documentation

## 2024-10-14 DIAGNOSIS — R21 Rash and other nonspecific skin eruption: Secondary | ICD-10-CM | POA: Diagnosis present

## 2024-10-14 DIAGNOSIS — Z91010 Allergy to peanuts: Secondary | ICD-10-CM | POA: Diagnosis not present

## 2024-10-14 MED ORDER — CEPHALEXIN 500 MG PO CAPS
500.0000 mg | ORAL_CAPSULE | Freq: Two times a day (BID) | ORAL | 0 refills | Status: DC
  Filled 2024-10-14: qty 20, 10d supply, fill #0

## 2024-10-14 MED ORDER — CEPHALEXIN 500 MG PO CAPS: 500.0000 mg | ORAL_CAPSULE | Freq: Two times a day (BID) | ORAL | 0 refills | Status: DC

## 2024-10-14 MED ORDER — HYDROXYZINE HCL 25 MG PO TABS
25.0000 mg | ORAL_TABLET | Freq: Once | ORAL | Status: AC
  Administered 2024-10-14: 25 mg via ORAL
  Filled 2024-10-14: qty 1

## 2024-10-14 MED ORDER — CEPHALEXIN 500 MG PO CAPS
500.0000 mg | ORAL_CAPSULE | Freq: Two times a day (BID) | ORAL | 0 refills | Status: DC
Start: 2024-10-14 — End: 2024-10-14

## 2024-10-14 MED ORDER — WHITE PETROLATUM EX OINT: TOPICAL_OINTMENT | CUTANEOUS | Status: DC | PRN

## 2024-10-14 MED ORDER — HYDROCORTISONE 1 % EX CREA
TOPICAL_CREAM | Freq: Four times a day (QID) | CUTANEOUS | Status: DC
  Filled 2024-10-14: qty 28

## 2024-10-14 NOTE — Discharge Instructions (Addendum)
 You were seen today for rash. While you were here we monitored your vitals, and examined you. These were all reassuring and there is no indication for any further testing or intervention in the emergency department at this time.   Things to do:  - Follow up with your primary care provider within the next 1-2 weeks - Thin layer of hydrocortisone  cream as needed for itch - Apply Aquaphor ointment or Vaseline to your chest and neck 4 times a day - Take Keflex  500 mg 2 times a day for 7 days  Return to the emergency department if you have any new or worsening symptoms including fevers, blisters in your mouth, chest pain, passing out, changes in vision or if you have any other concerns.

## 2024-10-14 NOTE — ED Notes (Signed)
Awaiting hydrocortisone cream from main pharmacy

## 2024-10-14 NOTE — ED Notes (Signed)
 Awaiting patient from lobby.

## 2024-10-14 NOTE — ED Provider Notes (Signed)
 [place] EM[facility]GENCY DEPARTMENT AT [facility] Provider Note   CSN: [id number] Arrival date & time: 10/14/24  1025     Patient presents with: No chief complaint on file.   Anna [name] is a 48 y.o. female with past medical history of hypertension presenting to the emergency department for reevaluation of a rash.  Patient reports she has arash on her chest, under her breasts, imaging has been been increasingly itchy.  Patient recently completed 2 steroid courses.  Patient endorsed initial improvement after the first round of steroids however no change after the second.  Patient has not been taking previous prescribed hydroxyzine  or any other over-the-counter antihistamine.  Patient denies fevers, chills, chest pain, shortness of breath, difficulty swallowing, swelling, nausea, vomiting, diarrhea.  Patient reports the rash is not just very itchy and irritated.  Patient denies eye pain, changes in vision, or sores in her mouth hands and feet.   HPI     Prior to Admission medications   Medication Sig Start Date End Date Taking? Authorizing Provider  acetaminophen -codeine  (TYLENOL  #3) 300-30 MG tablet Take 1 tablet by mouth every 4 (four) hours as needed for pain. 05/24/24     amLODipine  (NORVASC ) 10 MG tablet Take 1 tablet (10 mg total) by mouth daily. 09/10/23   Fleming, Zelda W, NP  cephALEXin  (KEFLEX ) 500 MG capsule Take 1 capsule (500 mg total) by mouth 2 (two) times daily. 10/14/24   Sharlet Dowdy, MD  cephALEXin  (KEFLEX ) 500 MG capsule Take 1 capsule (500 mg total) by mouth 2 (two) times daily. 10/14/24   Sharlet Dowdy, MD  chlorhexidine  (PERIDEX ) 0.12 % solution Swish 1 capful for 30 seconds, then spit three times daily. 05/24/24     clindamycin  (CLEOCIN ) 300 MG capsule Take 1 capsule (300 mg total) by mouth 3 (three) times daily with a glass of water . 05/24/24     hydrOXYzine  (ATARAX ) 25 MG tablet Take 1 tablet (25 mg total) by mouth every 6 (six) hours. 07/07/24   Ula Prentice SAUNDERS, MD   hydrOXYzine  (ATARAX ) 25 MG tablet Take 1 tablet (25 mg total) by mouth every 6 (six) hours as needed for itching. 09/21/24   Harris, Abigail, PA-C  ibuprofen  (ADVIL ) 400 MG tablet Take 1 tablet (400 mg total) by mouth 4 (four) times daily with food. 05/24/24     nystatin (MYCOSTATIN/NYSTOP) powder Apply 1 Application topically 3 (three) times daily. 09/30/24   Bernis Ernst, PA-C  ondansetron  (ZOFRAN -ODT) 4 MG disintegrating tablet Take 1 tablet (4 mg total) by mouth every 8 (eight) hours as needed for nausea or vomiting. 07/07/24   Ula Prentice SAUNDERS, MD  sertraline  (ZOLOFT ) 100 MG tablet Take 1 tablet (100 mg total) by mouth daily. 09/10/23   Fleming, Zelda W, NP  traZODone  (DESYREL ) 150 MG tablet Take 1 tablet (150 mg total) by mouth at bedtime as needed for sleep. 09/10/23   Theotis Haze ORN, NP    Allergies: Latex and Peanut-containing drug products    Review of Systems  Updated Vital Signs BP (!) 140/91   Pulse 100   Temp 98.2 F (36.8 C) (Oral)   Resp 19   Ht 5' 6 (1.676 m)   Wt 72.6 kg   LMP 04/12/2021 (Approximate)   SpO2 98%   BMI 25.82 kg/m   Physical Exam Vitals and nursing note reviewed.  Constitutional:      General: She is not in acute distress.    Appearance: She is well-developed.  HENT:     Head:  Normocephalic and atraumatic.  Eyes:     Conjunctiva/sclera: Conjunctivae normal.  Cardiovascular:     Rate and Rhythm: Normal rate and regular rhythm.     Heart sounds: No murmur heard. Pulmonary:     Effort: Pulmonary effort is normal. No respiratory distress.     Breath sounds: Normal breath sounds.  Abdominal:     Palpations: Abdomen is soft.     Tenderness: There is no abdominal tenderness.  Musculoskeletal:        General: No swelling.     Cervical back: Neck supple.  Skin:    General: Skin is warm and dry.     Capillary Refill: Capillary refill takes less than 2 seconds.     Findings: Rash present.     Comments: Rash over patient's chest, below her breast,  and shin.  Significant rough scaly skin with surrounding erythema.  Deep excoriation marks visualized on patient's chest.  Neurological:     Mental Status: She is alert.  Psychiatric:        Mood and Affect: Mood normal.     (all labs ordered are listed, but only abnormal results are displayed) Labs Reviewed - No data to display  EKG: None  Radiology: No results found.   Procedures   Medications Ordered in the ED  hydrocortisone  cream 1 % (0 Applications Topical Hold 10/14/24 1427)  white petrolatum  (VASELINE) gel (has no administration in time range)  hydrOXYzine  (ATARAX ) tablet 25 mg (25 mg Oral Given 10/14/24 1125)                                    Medical Decision Making Patient is a 48 year old female presenting for evaluation of rash. This is patient's third visit for the same symptoms and she has already completed 2 courses of steroids.  She has not been taking any antihistamines for pain control emollients to her rash.  Differential diagnosis includes but is not limited to eczema, contact dermatitis, shingles, SJS  Patient's rash is most consistent with eczema given its chronicity, following up if any scaly skin, and she has no known exposures or triggers.  Patient given oral hydroxyzine  for itching and provided hydrocortisone  cream for symptomatic control over the most itchy areas.  I discussed with patient the importance of topical emollients and keeping her skin protected and hydrated with either Aquaphor ointment or Vaseline.  Patient's chest with excoriation marks and some crusting, concern for superimposed bacterial infection therefore patient was prescribed Keflex  for impetigo. Patient without oral lesions, dermatomal distribution of rash, blistering or skin sloughing.  Shingles and SJS are less likely.  I discussed at length with patient the importance of following up with her primary care doctor for longitudinal care and management of these chronic symptoms.   Patient agrees with this plan.  Strict return precautions were given and she was discharged home in stable condition.    Amount and/or Complexity of Data Reviewed External Data Reviewed: notes.  Risk Prescription drug management.        Final diagnoses:  Rash  Eczema, unspecified type    ED Discharge Orders          Ordered    cephALEXin  (KEFLEX ) 500 MG capsule  2 times daily,   Status:  Discontinued        10/14/24 1210    cephALEXin  (KEFLEX ) 500 MG capsule  2 times daily  10/14/24 1320               Sharlet Dowdy, MD 10/14/24 1626    Neysa Caron PARAS, DO 10/15/24 [phone]

## 2024-10-14 NOTE — ED Triage Notes (Signed)
 C/O rash to left jaw, chest, and both breast for a month. Pt has been taking steroids for it but has not helped. Axox4.Denies throat swelling new allergies.

## 2024-10-19 ENCOUNTER — Ambulatory Visit: Payer: Self-pay

## 2024-10-19 NOTE — Telephone Encounter (Signed)
 Left message on voicemail to return call.  Was seen in ED on 10/14/2024 for rash and was given Rx on same day.   Has she picked up Rx to begin taking?   Will see if PCP can send referral to Dermatology.

## 2024-10-19 NOTE — Telephone Encounter (Signed)
 FYI Only or Action Required?: Action required by provider: request for appointment and referral request.  Patient was last seen in primary care on 09/10/2023 by Theotis Haze ORN, NP.  Called Nurse Triage reporting Rash.  Symptoms began several weeks ago.  Interventions attempted: Other: patient has been to the ED multiple times.  Symptoms are: gradually worsening.  Triage Disposition: See Physician Within 24 Hours  Patient/caregiver understands and will follow disposition?: No, wishes to speak with PCP      Copied from CRM #[id number]. Topic: Clinical - Red Word Triage >> Oct 19, 2024  2:12 PM Pinkey ORN wrote: [facility] that prompted transfer to Nurse Triage: Worsening Breakout / Rash + Pain >> Oct 19, 2024  2:14 PM Pinkey ORN wrote: Patient states she has this breakout / rash on her chest and face that's been there for about a month now. Patient states it's not getting any better and it's causing pain.        Reason for Disposition  [1] Localized rash is very painful AND [2] no fever  Answer Assessment - Initial Assessment Questions No appointments in the office until next month. Patient advised to go to urgent care or the ED for further treatment. Patient wanted to see if a referral to a dermatologist could be sent for her, and if there is any way to get her in for an appointment if possible. Please advise.      1. APPEARANCE of RASH: What does the rash look like? (e.g., blisters, dry flaky skin, red spots, redness, sores)     Red and flaky  2. LOCATION: Where is the rash located?      Chest and face  3. NUMBER: How many spots are there?      Multiple spots  5. ONSET: When did the rash start?      Going on for several weeks 6. ITCHING: Does the rash itch? If Yes, ask: How bad is the itch?  (Scale 0-10; or none, mild, moderate, severe)     Moderate  7. PAIN: Does the rash hurt? If Yes, ask: How bad is the pain?  (Scale 0-10; or none, mild, moderate,  severe)     Moderate  8. OTHER SYMPTOMS: Do you have any other symptoms? (e.g., fever)     Drainage and pain from right ear  Protocols used: Rash or Redness - Localized-A-AH

## 2024-10-19 NOTE — Telephone Encounter (Addendum)
 Received call back from patient requesting referral for dermatology and appt today.  No available appts today, offered to schedule with alternative provider tomorrow, patient declined and reports will just go to the UC.  Encouraged hand hygiene.  Advised UC today and ED if symptoms worsen.

## 2024-10-20 ENCOUNTER — Encounter (HOSPITAL_COMMUNITY): Payer: Self-pay | Admitting: Emergency Medicine

## 2024-10-20 ENCOUNTER — Ambulatory Visit (HOSPITAL_COMMUNITY)
Admission: EM | Admit: 2024-10-20 | Discharge: 2024-10-20 | Disposition: A | Discharge status: Home / Self Care | Payer: MEDICAID | Attending: Internal Medicine | Admitting: Internal Medicine

## 2024-10-20 ENCOUNTER — Other Ambulatory Visit: Payer: Self-pay

## 2024-10-20 DIAGNOSIS — H60391 Other infective otitis externa, right ear: Secondary | ICD-10-CM | POA: Diagnosis not present

## 2024-10-20 DIAGNOSIS — H6121 Impacted cerumen, right ear: Secondary | ICD-10-CM

## 2024-10-20 DIAGNOSIS — R21 Rash and other nonspecific skin eruption: Secondary | ICD-10-CM | POA: Diagnosis not present

## 2024-10-20 MED ORDER — FLUCONAZOLE 200 MG PO TABS
200.0000 mg | ORAL_TABLET | Freq: Every day | ORAL | 0 refills | Status: AC
  Filled 2024-10-20: qty 5, 5d supply, fill #0

## 2024-10-20 MED ORDER — OFLOXACIN 0.3 % OT SOLN
10.0000 [drp] | Freq: Every day | OTIC | 0 refills | Status: AC
  Filled 2024-10-20: qty 5, 10d supply, fill #0

## 2024-10-20 MED ORDER — CLOTRIMAZOLE-BETAMETHASONE 1-0.05 % EX CREA
TOPICAL_CREAM | CUTANEOUS | 0 refills | Status: DC
  Filled 2024-10-20: qty 15, 8d supply, fill #0

## 2024-10-20 NOTE — ED Notes (Signed)
 Reviewed work note, answered patient questions.  Patient requested cotton ball and this nurse did provide patient with supply to take home

## 2024-10-20 NOTE — ED Triage Notes (Signed)
 Pt reports that she broke out in a rash started on her face or her chest for a month. Been to ED 3 times for it. Reports painful  Pt c/o right pain and drainage for 2 days.   Reports her PCP told her the rash sounded to serious and to go to ED for it.

## 2024-10-20 NOTE — Discharge Instructions (Addendum)
 It is unclear what's causing your rash, however I would like for you to continue taking the antibiotic prescribed to in the ER (cephalexin ) to treat potential bacterial infection to the rash as a result of itching.  Take fluconazole 200 mg once daily for the next 5 days.  Apply Lotrisone cream to the rash every 12 hours for the next 7 to 10 days.  Do not use Lotrisone cream for longer than 10 days as this medication has a strong steroid in it that can lead to skin thinning if used for more than 10 days.  Follow-up with primary care provider as scheduled next Wednesday.   You have an ear infection of the ear canal known as otitis externa. Use ear drops as prescribed for 7 days. Do not place anything smaller than elbow deep into ear canal- this includes Q-tips. Place a cotton ball into the external ear canal while in the shower to avoid getting water  into the ears.  You may place a small amount of rubbing alcohol onto the end of a Q-tip and place this into the outer ear canal to dry up any remaining water  that may have gotten into the ear while showering or submerging head underwater to prevent this type of infection in the future.

## 2024-10-20 NOTE — ED Provider Notes (Addendum)
 [facility]    CSN: [id number] Arrival date & time: 10/20/24  9177      History   Chief Complaint Chief Complaint  Patient presents with   Rash   Otalgia    HPI Anna [name] is a 48 y.o. female.   Anna [name] is a 48 y.o. female presenting for chief complaint of rash that started to the left side of the face and spread to the neck/chest that started 1 month ago. Rash is itchy and has become warm/painful with clear drainage.  Rash is localized to the neck and central breast region with some lesions of the left face.  She denies lesions to other flexural surfaces of the body.  No recent sick contacts with similar rash.  She has been seen in the ER for rash twice, both times treated for eczema with steroid.  First round of steroid helped with itching, she did not experience much relief of itching/irritation with second round of steroid.  It appears she was also prescribed nystatin powder for inferior breast rash and states she has been using this without relief.  Most recently, she was prescribed cephalexin  antibiotic for secondary bacterial infection to rash on November 6 and has been taking this as prescribed for the last 6 days without much improvement.  She denies fever, chills, nausea, vomiting, body aches, recent antibiotic use other than Keflex , and malaise/weight loss without trying.  Denies recent known exposures to new personal hygiene products and lotions/perfumes.   She additionally complains of throbbing right ear pain and clear drainage from the right ear that started 2 days ago.  She states she has been trying to clean out the right ear without success.  Denies decreased hearing, tinnitus, dizziness, and eye drainage. No recent swimming or excessive water  to the right ear canal.      Past Medical History:  Diagnosis Date   Anemia    Anxiety    Hypertension    only elevated at MD office per pt. no meds    Patient Active Problem List   Diagnosis Date  Noted   Iron deficiency anemia due to chronic blood loss 09/10/2023   MDD (major depressive disorder), recurrent severe, without psychosis (HCC) 05/30/2022   History of total abdominal hysterectomy 06/20/2021   History of left oophorectomy 06/20/2021   Post-operative state 05/22/2021   Visit for routine gyn exam 05/09/2021   Fibroids 05/09/2021   Symptomatic anemia 04/20/2021   Microscopic hematuria 04/20/2021   Vaginal bleeding 03/21/2020   Depression 03/21/2020   Substance induced mood disorder (HCC) 03/29/2019   Menorrhagia with regular cycle 03/26/2019   Cocaine substance abuse (HCC) 03/26/2019    Past Surgical History:  Procedure Laterality Date   ANKLE FRACTURE SURGERY Right    HYSTERECTOMY ABDOMINAL WITH SALPINGECTOMY Bilateral 05/22/2021   Procedure: HYSTERECTOMY ABDOMINAL WITH SALPINGECTOMY AND LEFT OOPHORECTOMY;  Surgeon: Lorence Ozell CROME, MD;  Location: [facility];  Service: Gynecology;  Laterality: Bilateral;  TAP BLOCK    OB History     Gravida  6   Para  3   Term  3   Preterm  0   AB  3   Living  3      SAB  0   IAB  3   Ectopic  0   Multiple  0   Live Births  3            Home Medications    Prior to Admission medications   Medication Sig  Start Date End Date Taking? Authorizing Provider  clotrimazole-betamethasone (LOTRISONE) cream Apply to affected area 2 times daily as needed. 10/20/24  Yes Enedelia Dorna HERO, FNP  fluconazole (DIFLUCAN) 200 MG tablet Take 1 tablet (200 mg total) by mouth daily for 5 days. 10/20/24 10/25/24 Yes Tiann Saha, Dorna HERO, FNP  ofloxacin (FLOXIN) 0.3 % OTIC solution Place 10 drops into the right ear daily for 7 days. 10/20/24 10/30/24 Yes StanhopeDorna HERO, FNP  acetaminophen -codeine  (TYLENOL  #3) 300-30 MG tablet Take 1 tablet by mouth every 4 (four) hours as needed for pain. 05/24/24     amLODipine  (NORVASC ) 10 MG tablet Take 1 tablet (10 mg total) by mouth daily. 09/10/23   Fleming, Zelda W, NP  cephALEXin   (KEFLEX ) 500 MG capsule Take 1 capsule (500 mg total) by mouth 2 (two) times daily. 10/14/24   Sharlet Dowdy, MD  cephALEXin  (KEFLEX ) 500 MG capsule Take 1 capsule (500 mg total) by mouth 2 (two) times daily. 10/14/24   Sharlet Dowdy, MD  chlorhexidine  (PERIDEX ) 0.12 % solution Swish 1 capful for 30 seconds, then spit three times daily. 05/24/24     clindamycin  (CLEOCIN ) 300 MG capsule Take 1 capsule (300 mg total) by mouth 3 (three) times daily with a glass of water . 05/24/24     hydrOXYzine  (ATARAX ) 25 MG tablet Take 1 tablet (25 mg total) by mouth every 6 (six) hours. 07/07/24   Ula Prentice SAUNDERS, MD  hydrOXYzine  (ATARAX ) 25 MG tablet Take 1 tablet (25 mg total) by mouth every 6 (six) hours as needed for itching. 09/21/24   Harris, Abigail, PA-C  ibuprofen  (ADVIL ) 400 MG tablet Take 1 tablet (400 mg total) by mouth 4 (four) times daily with food. 05/24/24     nystatin (MYCOSTATIN/NYSTOP) powder Apply 1 Application topically 3 (three) times daily. 09/30/24   Bernis Ernst, PA-C  ondansetron  (ZOFRAN -ODT) 4 MG disintegrating tablet Take 1 tablet (4 mg total) by mouth every 8 (eight) hours as needed for nausea or vomiting. 07/07/24   Ula Prentice SAUNDERS, MD  sertraline  (ZOLOFT ) 100 MG tablet Take 1 tablet (100 mg total) by mouth daily. 09/10/23   Fleming, Zelda W, NP  traZODone  (DESYREL ) 150 MG tablet Take 1 tablet (150 mg total) by mouth at bedtime as needed for sleep. 09/10/23   Theotis Haze ORN, NP    Family History Family History  Problem Relation Age of Onset   Hypertension Mother    Breast cancer Maternal Aunt 50   Heart failure Maternal Grandfather    Colon cancer Neg Hx    Colon polyps Neg Hx    Esophageal cancer Neg Hx    Rectal cancer Neg Hx    Stomach cancer Neg Hx     Social History Social History   Tobacco Use   Smoking status: Former    Types: Cigarettes   Smokeless tobacco: Never   Tobacco comments:    pt states quit 1-2 years ago   Vaping Use   Vaping status: Never Used  Substance Use  Topics   Alcohol use: Yes    Comment: occassionally, not often   Drug use: Yes    Types: Cocaine, Marijuana    Comment: occasional cocaine use per availability; cannabis use 2x weekly     Allergies   Latex and Peanut-containing drug products   Review of Systems Review of Systems Per HPI  Physical Exam Triage Vital Signs ED Triage Vitals  Encounter Vitals Group     BP 10/20/24 0836 (!) 159/98  Girls Systolic BP Percentile --      Girls Diastolic BP Percentile --      Boys Systolic BP Percentile --      Boys Diastolic BP Percentile --      Pulse Rate 10/20/24 0836 81     Resp 10/20/24 0836 18     Temp 10/20/24 0836 98.3 F (36.8 C)     Temp src --      SpO2 10/20/24 0836 97 %     Weight --      Height --      Head Circumference --      Peak Flow --      Pain Score 10/20/24 0835 10     Pain Loc --      Pain Education --      Exclude from Growth Chart --    No data found.  Updated Vital Signs BP (!) 159/98 (BP Location: Right Arm)   Pulse 81   Temp 98.3 F (36.8 C)   Resp 18   LMP 04/12/2021 (Approximate)   SpO2 97%   Visual Acuity Right Eye Distance:   Left Eye Distance:   Bilateral Distance:    Right Eye Near:   Left Eye Near:    Bilateral Near:     Physical Exam Vitals and nursing note reviewed.  Constitutional:      Appearance: She is not ill-appearing or toxic-appearing.  HENT:     Head: Normocephalic and atraumatic.     Right Ear: Hearing, tympanic membrane and external ear normal. There is impacted cerumen.     Left Ear: Hearing, tympanic membrane, ear canal and external ear normal.     Nose: Nose normal.     Mouth/Throat:     Lips: Pink.     Mouth: Mucous membranes are moist.     Pharynx: No posterior oropharyngeal erythema.  Eyes:     General: Lids are normal. Vision grossly intact. Gaze aligned appropriately.     Extraocular Movements: Extraocular movements intact.     Conjunctiva/sclera: Conjunctivae normal.  Pulmonary:      Effort: Pulmonary effort is normal.  Musculoskeletal:     Cervical back: Neck supple.  Lymphadenopathy:     Cervical: No cervical adenopathy.  Skin:    General: Skin is warm and dry.     Capillary Refill: Capillary refill takes less than 2 seconds.     Findings: Rash (Diffuse confluent mild erythema/hypopigmentation to the chest wall with dispersed hyperpigmented lesions. Non-draining currently. Similar lesions to the left face. See images of rash below.) present.  Neurological:     General: No focal deficit present.     Mental Status: She is alert and oriented to person, place, and time. Mental status is at baseline.     Cranial Nerves: No dysarthria or facial asymmetry.  Psychiatric:        Mood and Affect: Mood normal.        Speech: Speech normal.        Behavior: Behavior normal.        Thought Content: Thought content normal.        Judgment: Judgment normal.    Left neck/chest rash   Neck and chest rash    UC Treatments / Results  Labs (all labs ordered are listed, but only abnormal results are displayed) Labs Reviewed - No data to display  EKG   Radiology No results found.  Procedures Procedures (including critical care time)  Medications Ordered in UC Medications -  No data to display  Initial Impression / Assessment and Plan / UC Course  I have reviewed the triage vital signs and the nursing notes.  Pertinent labs & imaging results that were available during my care of the patient were reviewed by me and considered in my medical decision making (see chart for details).   1. Rash and nonspecific skin eruption Unclear cause of patient's rash. Low suspicion for eczema, SJS, herpes zoster.  She has been treated with steroid, topical emollients like Vaseline, nystatin, and antibiotics at this point without much relief or improvement in rash.  Recent labs reviewed showing normal renal and hepatic function (07/23/2024).   I would like to treat for fungal  causes of rash with fluconazole 200 mg once daily for 5 days and Lotrisone cream to the rash every 12 hours for the next 7 to 10 days.  Advised to avoid use of Lotrisone for greater than 10 days due to risks of skin thinning.  She was able to schedule a follow-up appointment with her primary care provider for next week while in clinic due to cost rash further and to ensure improvement.  Recommend dermatology referral for ongoing workup.  Continue taking Keflex  to treat for secondary bacterial infection.  No need to change antibiotic therapy today, she has 4 more days of Keflex .  2. Impacted cerumen of right ear, other infective otitis externa Presentation consistent with otitis externa of the right ear.  Ear lavage of the right ear performed by nursing staff revealing thick crusty white drainage to the floor of the right ear canal.  Right TM normal.  Will manage this with ofloxacin otic drops as prescribed for 7 days.  Encouraged to avoid getting water  into affected ear(s) for at least 7-10 days. Cotton ball to the external ear during showers to avoid water  into the ear canal. Over the counter medications as needed for pain.   Counseled patient on potential for adverse effects with medications prescribed/recommended today, strict ER and return-to-clinic precautions discussed, patient verbalized understanding.    Final Clinical Impressions(s) / UC Diagnoses   Final diagnoses:  Rash and nonspecific skin eruption  Impacted cerumen of right ear  Other infective acute otitis externa of right ear     Discharge Instructions      It is unclear what's causing your rash, however I would like for you to continue taking the antibiotic prescribed to in the ER (cephalexin ) to treat potential bacterial infection to the rash as a result of itching.  Take fluconazole 200 mg once daily for the next 5 days.  Apply Lotrisone cream to the rash every 12 hours for the next 7 to 10 days.  Do not use  Lotrisone cream for longer than 10 days as this medication has a strong steroid in it that can lead to skin thinning if used for more than 10 days.  Follow-up with primary care provider as scheduled next Wednesday.   You have an ear infection of the ear canal known as otitis externa. Use ear drops as prescribed for 7 days. Do not place anything smaller than elbow deep into ear canal- this includes Q-tips. Place a cotton ball into the external ear canal while in the shower to avoid getting water  into the ears.  You may place a small amount of rubbing alcohol onto the end of a Q-tip and place this into the outer ear canal to dry up any remaining water  that may have gotten into the ear while showering or  submerging head underwater to prevent this type of infection in the future.       ED Prescriptions     Medication Sig Dispense Auth. Provider   clotrimazole-betamethasone (LOTRISONE) cream Apply to affected area 2 times daily as needed. 15 g Enedelia Going M, FNP   ofloxacin (FLOXIN) 0.3 % OTIC solution Place 10 drops into the right ear daily for 7 days. 5 mL Enedelia Going M, FNP   fluconazole (DIFLUCAN) 200 MG tablet Take 1 tablet (200 mg total) by mouth daily for 5 days. 5 tablet Enedelia Going HERO, FNP      PDMP not reviewed this encounter.   Enedelia Going HERO, FNP 10/20/24 1006    Enedelia Going [address], [place] 10/20/24 1007

## 2024-10-27 ENCOUNTER — Other Ambulatory Visit: Payer: Self-pay | Admitting: Nurse Practitioner

## 2024-10-27 ENCOUNTER — Ambulatory Visit: Payer: MEDICAID

## 2024-10-27 DIAGNOSIS — R21 Rash and other nonspecific skin eruption: Secondary | ICD-10-CM

## 2024-10-27 NOTE — Telephone Encounter (Signed)
 Referral placed to dermatology

## 2024-11-12 ENCOUNTER — Ambulatory Visit: Payer: MEDICAID

## 2024-11-18 ENCOUNTER — Emergency Department (HOSPITAL_COMMUNITY): Admission: EM | Admit: 2024-11-18 | Discharge: 2024-11-18 | Discharge status: Against Medical Advice | Payer: MEDICAID

## 2024-12-14 ENCOUNTER — Ambulatory Visit (HOSPITAL_COMMUNITY)
Admission: EM | Admit: 2024-12-14 | Discharge: 2024-12-15 | Disposition: A | Discharge status: Other Acute Inpatient Hospital | Payer: MEDICAID | Attending: Nurse Practitioner | Admitting: Nurse Practitioner

## 2024-12-14 DIAGNOSIS — F1910 Other psychoactive substance abuse, uncomplicated: Secondary | ICD-10-CM

## 2024-12-14 DIAGNOSIS — Z791 Long term (current) use of non-steroidal anti-inflammatories (NSAID): Secondary | ICD-10-CM | POA: Insufficient documentation

## 2024-12-14 DIAGNOSIS — Z5901 Sheltered homelessness: Secondary | ICD-10-CM | POA: Insufficient documentation

## 2024-12-14 DIAGNOSIS — F1414 Cocaine abuse with cocaine-induced mood disorder: Secondary | ICD-10-CM | POA: Insufficient documentation

## 2024-12-14 DIAGNOSIS — Z5900 Homelessness unspecified: Secondary | ICD-10-CM

## 2024-12-14 DIAGNOSIS — F1410 Cocaine abuse, uncomplicated: Secondary | ICD-10-CM

## 2024-12-14 DIAGNOSIS — F32A Depression, unspecified: Secondary | ICD-10-CM | POA: Insufficient documentation

## 2024-12-14 DIAGNOSIS — R440 Auditory hallucinations: Secondary | ICD-10-CM | POA: Insufficient documentation

## 2024-12-14 DIAGNOSIS — F1994 Other psychoactive substance use, unspecified with psychoactive substance-induced mood disorder: Secondary | ICD-10-CM

## 2024-12-14 DIAGNOSIS — I491 Atrial premature depolarization: Secondary | ICD-10-CM | POA: Insufficient documentation

## 2024-12-14 DIAGNOSIS — R45851 Suicidal ideations: Secondary | ICD-10-CM | POA: Insufficient documentation

## 2024-12-14 DIAGNOSIS — F419 Anxiety disorder, unspecified: Secondary | ICD-10-CM | POA: Insufficient documentation

## 2024-12-14 DIAGNOSIS — Z79899 Other long term (current) drug therapy: Secondary | ICD-10-CM | POA: Insufficient documentation

## 2024-12-14 DIAGNOSIS — R9431 Abnormal electrocardiogram [ECG] [EKG]: Secondary | ICD-10-CM

## 2024-12-14 DIAGNOSIS — F1290 Cannabis use, unspecified, uncomplicated: Secondary | ICD-10-CM

## 2024-12-14 LAB — POCT URINE DRUG SCREEN - MANUAL ENTRY (I-SCREEN)
POC Amphetamine UR: NOT DETECTED
POC Buprenorphine (BUP): NOT DETECTED
POC Cocaine UR: POSITIVE — AB
POC Marijuana UR: POSITIVE — AB
POC Methadone UR: NOT DETECTED
POC Methamphetamine UR: NOT DETECTED
POC Morphine: NOT DETECTED
POC Oxazepam (BZO): NOT DETECTED
POC Oxycodone UR: NOT DETECTED
POC Secobarbital (BAR): NOT DETECTED

## 2024-12-14 LAB — POC URINE PREG, ED: Preg Test, Ur: NEGATIVE

## 2024-12-14 MED ORDER — DIPHENHYDRAMINE HCL 50 MG/ML IJ SOLN: 50.0000 mg | Freq: Three times a day (TID) | INTRAMUSCULAR | Status: DC | PRN

## 2024-12-14 MED ORDER — ACETAMINOPHEN 325 MG PO TABS: 650.0000 mg | ORAL_TABLET | Freq: Four times a day (QID) | ORAL | Status: DC | PRN

## 2024-12-14 MED ORDER — LORAZEPAM 2 MG/ML IJ SOLN: 2.0000 mg | Freq: Three times a day (TID) | INTRAMUSCULAR | Status: DC | PRN

## 2024-12-14 MED ORDER — ALUM & MAG HYDROXIDE-SIMETH 200-200-20 MG/5ML PO SUSP: 30.0000 mL | ORAL | Status: DC | PRN

## 2024-12-14 MED ORDER — HYDROXYZINE HCL 25 MG PO TABS
25.0000 mg | ORAL_TABLET | Freq: Three times a day (TID) | ORAL | Status: DC | PRN
  Administered 2024-12-15: 25 mg via ORAL
  Filled 2024-12-14: qty 1

## 2024-12-14 MED ORDER — HALOPERIDOL LACTATE 5 MG/ML IJ SOLN: 10.0000 mg | Freq: Three times a day (TID) | INTRAMUSCULAR | Status: DC | PRN

## 2024-12-14 MED ORDER — HALOPERIDOL 5 MG PO TABS: 5.0000 mg | ORAL_TABLET | Freq: Three times a day (TID) | ORAL | Status: DC | PRN

## 2024-12-14 MED ORDER — TRAZODONE HCL 50 MG PO TABS
50.0000 mg | ORAL_TABLET | Freq: Every evening | ORAL | Status: DC | PRN
  Administered 2024-12-14: 50 mg via ORAL
  Filled 2024-12-14: qty 1

## 2024-12-14 MED ORDER — DIPHENHYDRAMINE HCL 50 MG PO CAPS: 50.0000 mg | ORAL_CAPSULE | Freq: Three times a day (TID) | ORAL | Status: DC | PRN

## 2024-12-14 MED ORDER — HALOPERIDOL LACTATE 5 MG/ML IJ SOLN: 5.0000 mg | Freq: Three times a day (TID) | INTRAMUSCULAR | Status: DC | PRN

## 2024-12-14 MED ORDER — MAGNESIUM HYDROXIDE 400 MG/5ML PO SUSP: 30.0000 mL | Freq: Every day | ORAL | Status: DC | PRN
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
## 2024-12-14 NOTE — ED Notes (Signed)
 Patient denies pain and is resting comfortably.
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
## 2024-12-15 ENCOUNTER — Inpatient Hospital Stay (HOSPITAL_COMMUNITY)
Admission: AD | Admit: 2024-12-15 | Discharge: 2024-12-24 | DRG: 885 | Disposition: A | Discharge status: Home / Self Care | Payer: MEDICAID | Source: Intra-hospital | Attending: Student in an Organized Health Care Education/Training Program | Admitting: Student in an Organized Health Care Education/Training Program

## 2024-12-15 ENCOUNTER — Other Ambulatory Visit: Payer: Self-pay

## 2024-12-15 ENCOUNTER — Encounter (HOSPITAL_COMMUNITY): Payer: Self-pay

## 2024-12-15 DIAGNOSIS — L299 Pruritus, unspecified: Secondary | ICD-10-CM | POA: Diagnosis present

## 2024-12-15 DIAGNOSIS — F333 Major depressive disorder, recurrent, severe with psychotic symptoms: Principal | ICD-10-CM | POA: Diagnosis present

## 2024-12-15 DIAGNOSIS — Z635 Disruption of family by separation and divorce: Secondary | ICD-10-CM | POA: Diagnosis not present

## 2024-12-15 DIAGNOSIS — R45851 Suicidal ideations: Principal | ICD-10-CM | POA: Diagnosis present

## 2024-12-15 DIAGNOSIS — F1414 Cocaine abuse with cocaine-induced mood disorder: Secondary | ICD-10-CM | POA: Diagnosis not present

## 2024-12-15 DIAGNOSIS — F1490 Cocaine use, unspecified, uncomplicated: Secondary | ICD-10-CM | POA: Diagnosis not present

## 2024-12-15 DIAGNOSIS — Z5901 Sheltered homelessness: Secondary | ICD-10-CM | POA: Diagnosis not present

## 2024-12-15 DIAGNOSIS — Z90721 Acquired absence of ovaries, unilateral: Secondary | ICD-10-CM | POA: Diagnosis not present

## 2024-12-15 DIAGNOSIS — F1910 Other psychoactive substance abuse, uncomplicated: Secondary | ICD-10-CM

## 2024-12-15 DIAGNOSIS — G4700 Insomnia, unspecified: Secondary | ICD-10-CM | POA: Diagnosis present

## 2024-12-15 DIAGNOSIS — Z803 Family history of malignant neoplasm of breast: Secondary | ICD-10-CM

## 2024-12-15 DIAGNOSIS — I10 Essential (primary) hypertension: Secondary | ICD-10-CM | POA: Diagnosis present

## 2024-12-15 DIAGNOSIS — F1510 Other stimulant abuse, uncomplicated: Secondary | ICD-10-CM | POA: Diagnosis present

## 2024-12-15 DIAGNOSIS — Z791 Long term (current) use of non-steroidal anti-inflammatories (NSAID): Secondary | ICD-10-CM | POA: Diagnosis not present

## 2024-12-15 DIAGNOSIS — Z9151 Personal history of suicidal behavior: Secondary | ICD-10-CM

## 2024-12-15 DIAGNOSIS — Z5982 Transportation insecurity: Secondary | ICD-10-CM

## 2024-12-15 DIAGNOSIS — F1410 Cocaine abuse, uncomplicated: Secondary | ICD-10-CM | POA: Diagnosis present

## 2024-12-15 DIAGNOSIS — Z59868 Other specified financial insecurity: Secondary | ICD-10-CM | POA: Diagnosis not present

## 2024-12-15 DIAGNOSIS — Z90710 Acquired absence of both cervix and uterus: Secondary | ICD-10-CM | POA: Diagnosis not present

## 2024-12-15 DIAGNOSIS — Z604 Social exclusion and rejection: Secondary | ICD-10-CM | POA: Diagnosis present

## 2024-12-15 DIAGNOSIS — F419 Anxiety disorder, unspecified: Secondary | ICD-10-CM | POA: Diagnosis present

## 2024-12-15 DIAGNOSIS — Z5902 Unsheltered homelessness: Secondary | ICD-10-CM | POA: Diagnosis not present

## 2024-12-15 DIAGNOSIS — Z87891 Personal history of nicotine dependence: Secondary | ICD-10-CM

## 2024-12-15 DIAGNOSIS — Z79899 Other long term (current) drug therapy: Secondary | ICD-10-CM | POA: Diagnosis not present

## 2024-12-15 DIAGNOSIS — Z8249 Family history of ischemic heart disease and other diseases of the circulatory system: Secondary | ICD-10-CM

## 2024-12-15 DIAGNOSIS — F32A Depression, unspecified: Secondary | ICD-10-CM | POA: Diagnosis not present

## 2024-12-15 DIAGNOSIS — Z5941 Food insecurity: Secondary | ICD-10-CM | POA: Diagnosis not present

## 2024-12-15 DIAGNOSIS — Z560 Unemployment, unspecified: Secondary | ICD-10-CM | POA: Diagnosis not present

## 2024-12-15 DIAGNOSIS — F1290 Cannabis use, unspecified, uncomplicated: Secondary | ICD-10-CM | POA: Diagnosis not present

## 2024-12-15 DIAGNOSIS — F1210 Cannabis abuse, uncomplicated: Secondary | ICD-10-CM | POA: Diagnosis present

## 2024-12-15 DIAGNOSIS — F418 Other specified anxiety disorders: Secondary | ICD-10-CM

## 2024-12-15 DIAGNOSIS — R440 Auditory hallucinations: Secondary | ICD-10-CM | POA: Diagnosis not present

## 2024-12-15 DIAGNOSIS — I491 Atrial premature depolarization: Secondary | ICD-10-CM | POA: Diagnosis not present

## 2024-12-15 LAB — COMPREHENSIVE METABOLIC PANEL WITH GFR
ALT: 15 U/L (ref 0–44)
AST: 36 U/L (ref 15–41)
Albumin: 4.4 g/dL (ref 3.5–5.0)
Alkaline Phosphatase: 102 U/L (ref 38–126)
Anion gap: 14 (ref 5–15)
BUN: 9 mg/dL (ref 6–20)
CO2: 29 mmol/L (ref 22–32)
Calcium: 10 mg/dL (ref 8.9–10.3)
Chloride: 98 mmol/L (ref 98–111)
Creatinine, Ser: 0.88 mg/dL (ref 0.44–1.00)
GFR, Estimated: >60 mL/min
Glucose, Bld: 74 mg/dL (ref 70–99)
Potassium: 3.7 mmol/L (ref 3.5–5.1)
Sodium: 141 mmol/L (ref 135–145)
Total Bilirubin: 0.6 mg/dL (ref 0.0–1.2)
Total Protein: 8.1 g/dL (ref 6.5–8.1)

## 2024-12-15 LAB — CBC WITH DIFFERENTIAL/PLATELET
Abs Immature Granulocytes: 0.02 K/uL (ref 0.00–0.07)
Basophils Absolute: 0 K/uL (ref 0.0–0.1)
Basophils Relative: 1 %
Eosinophils Absolute: 0.2 K/uL (ref 0.0–0.5)
Eosinophils Relative: 4 %
HCT: 36.6 % (ref 36.0–46.0)
Hemoglobin: 12.5 g/dL (ref 12.0–15.0)
Immature Granulocytes: 1 %
Lymphocytes Relative: 23 %
Lymphs Abs: 1 K/uL (ref 0.7–4.0)
MCH: 29.9 pg (ref 26.0–34.0)
MCHC: 34.2 g/dL (ref 30.0–36.0)
MCV: 87.6 fL (ref 80.0–100.0)
Monocytes Absolute: 0.4 K/uL (ref 0.1–1.0)
Monocytes Relative: 10 %
Neutro Abs: 2.5 K/uL (ref 1.7–7.7)
Neutrophils Relative %: 61 %
Platelets: 343 K/uL (ref 150–400)
RBC: 4.18 MIL/uL (ref 3.87–5.11)
RDW: 16.8 % — ABNORMAL HIGH (ref 11.5–15.5)
WBC: 4.1 K/uL (ref 4.0–10.5)
nRBC: 0 % (ref 0.0–0.2)

## 2024-12-15 MED ORDER — TRAZODONE HCL 50 MG PO TABS: 50.0000 mg | ORAL_TABLET | Freq: Every evening | ORAL | Status: DC | PRN

## 2024-12-15 MED ORDER — ALUM & MAG HYDROXIDE-SIMETH 200-200-20 MG/5ML PO SUSP: 30.0000 mL | ORAL | Status: DC | PRN

## 2024-12-15 MED ORDER — LORAZEPAM 2 MG/ML IJ SOLN: 2.0000 mg | Freq: Three times a day (TID) | INTRAMUSCULAR | Status: DC | PRN

## 2024-12-15 MED ORDER — HALOPERIDOL 5 MG PO TABS: 5.0000 mg | ORAL_TABLET | Freq: Three times a day (TID) | ORAL | Status: DC | PRN

## 2024-12-15 MED ORDER — HALOPERIDOL LACTATE 5 MG/ML IJ SOLN: 10.0000 mg | Freq: Three times a day (TID) | INTRAMUSCULAR | Status: DC | PRN

## 2024-12-15 MED ORDER — ACETAMINOPHEN 325 MG PO TABS
650.0000 mg | ORAL_TABLET | Freq: Four times a day (QID) | ORAL | Status: DC | PRN
  Administered 2024-12-18: 650 mg via ORAL
  Filled 2024-12-15: qty 2

## 2024-12-15 MED ORDER — DIPHENHYDRAMINE HCL 50 MG/ML IJ SOLN: 50.0000 mg | Freq: Three times a day (TID) | INTRAMUSCULAR | Status: DC | PRN

## 2024-12-15 MED ORDER — DIPHENHYDRAMINE HCL 25 MG PO CAPS: 50.0000 mg | ORAL_CAPSULE | Freq: Three times a day (TID) | ORAL | Status: DC | PRN

## 2024-12-15 MED ORDER — MAGNESIUM HYDROXIDE 400 MG/5ML PO SUSP: 30.0000 mL | Freq: Every day | ORAL | Status: DC | PRN

## 2024-12-15 MED ORDER — HYDROXYZINE HCL 25 MG PO TABS
25.0000 mg | ORAL_TABLET | Freq: Three times a day (TID) | ORAL | Status: DC | PRN
  Administered 2024-12-15 – 2024-12-19 (×6): 25 mg via ORAL
  Filled 2024-12-15 (×6): qty 1

## 2024-12-15 MED ORDER — TRAZODONE HCL 50 MG PO TABS
50.0000 mg | ORAL_TABLET | Freq: Every evening | ORAL | Status: DC | PRN
  Administered 2024-12-15: 50 mg via ORAL
  Filled 2024-12-15: qty 1

## 2024-12-15 MED ORDER — HALOPERIDOL LACTATE 5 MG/ML IJ SOLN: 5.0000 mg | Freq: Three times a day (TID) | INTRAMUSCULAR | Status: DC | PRN

## 2024-12-15 NOTE — Group Note (Signed)
 Date:  12/15/2024 Time:  8:23 PM  Group Topic/Focus:  Wrap-Up Group:   The focus of this group is to help patients review their daily goal of treatment and discuss progress on daily workbooks.    Participation Level:  Active  Participation Quality:  Appropriate  Affect:  Appropriate  Cognitive:  Appropriate  Insight: Appropriate  Engagement in Group:  Engaged  Modes of Intervention:  Discussion  Additional Comments:   Pt attended NA meeting  Anna [name] A Jarl Sellitto 12/15/2024, 8:23 PM

## 2024-12-15 NOTE — Progress Notes (Signed)
 Admission note: Patient is a 49 year-old AA female, who was voluntarily admitted from [facility] for depression, substance use disorder. Patient arrived to the unit via safe transport at 1600, awake, alert, and oriented to place and person. Patient presented with  flat affect and a depressed mood on arrival to unit. When clinician inquired from patient her reason for coming to the hospital, patient states I wasn't feeling good, I think its suicidal. Patient endorsed anxiety, and depression. Patient denies SI/HI/AVH at the time of admission interview with her. No acute distress noted at this time.  Pt  has orientation to unit, room and routine, and Information packet given to her.  Admission INP armband ID verified with patient, and in place, fall risk assessment completed with Patient and she verbalized understanding of risks associated with falls. No contraband found during skin assessment, Skin, clean-dry- intact without evidence of bruising, or skin tears and tracks marks. Q 15 minutes safety observation in place. Staff will continue to provide support to patient.

## 2024-12-15 NOTE — Progress Notes (Signed)
 Pt has been accepted to [facility] on 12/15/2024 Bed assignment: 306-02  Pt meets inpatient criteria per: Thurman Ivans NP  Attending Physician will be: Leita Arts MD  Report can be called to: Adult unit: [phone]  Pt can arrive after pending EKG, vol.   Care Team Notified: [facility] Cherylynn Ernst   Tunisia Ugochi Henzler LCSW  12/15/2024 10:32 AM

## 2024-12-15 NOTE — ED Notes (Signed)
 Pt resting comfortably at present.  Observed to turn and reposition Safety Maintained.

## 2024-12-15 NOTE — Group Note (Signed)
 Date:  12/15/2024 Time:  5:09 PM  Group Topic/Focus: Clean Up Your Sleep Hygiene  Self Care:   The focus of this group is to help patients understand the importance of self-care in order to improve or restore emotional, physical, spiritual, interpersonal, and financial health. Wellness Toolbox:   The focus of this group is to discuss various aspects of wellness, balancing those aspects and exploring ways to increase the ability to experience wellness.  Patients will create a wellness toolbox for use upon discharge.    Participation Level:  Did Not Attend    Inocente PARAS [facility] 12/15/2024, 5:09 PM

## 2024-12-15 NOTE — Progress Notes (Signed)
(  Sleep Hours) -7 (Any PRNs that were needed, meds refused, or side effects to meds)- Trazodone  and hydroxyzine  (Any disturbances and when (visitation, over night)-no (Concerns raised by the patient)- no (SI/HI/AVH)- denies

## 2024-12-15 NOTE — Plan of Care (Signed)
  Problem: Education: Goal: Knowledge of [facility] Education information/materials will improve Outcome: Progressing   Problem: Coping: Goal: Ability to demonstrate self-control will improve Outcome: Progressing

## 2024-12-15 NOTE — Discharge Instructions (Addendum)
 Transfer to [facility] for inpatient psychiatric treatment.

## 2024-12-15 NOTE — ED Provider Notes (Signed)
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 within normal ranges -CBC with differential: within normal ranges besides mildly elevated RDW at 16.8 -glucose normal -urine pregnancy test negative -UDS positive for cocaine and marijuana  New orders: -EKG  to establish baseline  Medications: -continue tylenol  650 mg by mouth every 6 hours PRN for mild pain -continue maalox/mylanta 30 mL every 4 hours as needed for indigestion -continue atarax  25 mg by mouth tid PRN for anxiety -continue milk of magnesia 30 mL by mouth daily PRN for mild constipation -modified trazodone  from 50 mg by mouth at bedtime PRN to 50-100 at bedtime PRN (Please administer 50 mg first and if patient is unable to fall asleep within one hour of laying down, a repeat dose of 50 mg can be administered.)  Recommendation for inpatient psychiatric treatment given that patient is unable to contract for safety if discharged home today. She expresses passive suicidal ideations, but then when asked about a plan she reports that sometimes she does think about jumping off a bridge. Also endorses feelings of wanting to make it happen in reference to ending her life.   Patient has been accepted to [facility], Adult Unit per Cherylynn SAUNDERS., administrative coordinator pending voluntary consent form and EKG collection. Accepting provider is Dr. Towana.  Donasia Wimes, NP 12/15/2024 11:47 AM

## 2024-12-15 NOTE — Tx Team (Signed)
 Initial Treatment Plan 12/15/2024 6:49 PM Anna [name] FMW:[id number]    PATIENT STRESSORS: Substance abuse     PATIENT STRENGTHS: Motivation for treatment/growth    PATIENT IDENTIFIED PROBLEMS: Depression  Substance use disorder   Anxiety  homelessness               DISCHARGE CRITERIA:  Verbal commitment to aftercare and medication compliance  PRELIMINARY DISCHARGE PLAN: Attend 12-step recovery group  PATIENT/FAMILY INVOLVEMENT: This treatment plan has been presented to and reviewed with the patient, Anna [name], has been given the opportunity to ask questions and make suggestions.  Joaquin MALVA Doing, RN 12/15/2024, 6:49 PM

## 2024-12-16 DIAGNOSIS — R45851 Suicidal ideations: Secondary | ICD-10-CM

## 2024-12-16 DIAGNOSIS — F1490 Cocaine use, unspecified, uncomplicated: Secondary | ICD-10-CM

## 2024-12-16 DIAGNOSIS — F1290 Cannabis use, unspecified, uncomplicated: Secondary | ICD-10-CM | POA: Diagnosis not present

## 2024-12-16 DIAGNOSIS — F333 Major depressive disorder, recurrent, severe with psychotic symptoms: Principal | ICD-10-CM

## 2024-12-16 MED ORDER — SERTRALINE HCL 50 MG PO TABS
50.0000 mg | ORAL_TABLET | Freq: Every day | ORAL | Status: DC
  Administered 2024-12-16 – 2024-12-18 (×3): 50 mg via ORAL
  Filled 2024-12-16 (×3): qty 1

## 2024-12-16 MED ORDER — TRAZODONE HCL 100 MG PO TABS
100.0000 mg | ORAL_TABLET | Freq: Every evening | ORAL | Status: DC | PRN
  Administered 2024-12-16: 100 mg via ORAL
  Filled 2024-12-16: qty 1

## 2024-12-16 MED ORDER — WHITE PETROLATUM EX OINT
TOPICAL_OINTMENT | CUTANEOUS | Status: AC
  Administered 2024-12-16: 1
  Filled 2024-12-16: qty 5

## 2024-12-16 NOTE — [facility] Suicide Risk Assessment (Signed)
 Suicide Risk Assessment  Admission Assessment    [facility] Admission Suicide Risk Assessment   Nursing information obtained from:  Patient  Demographic factors:  Unemployed  Current Mental Status:  NA  Loss Factors:  Financial problems / change in socioeconomic status  Historical Factors:  NA  Risk Reduction Factors:  NA  Total Time spent with patient: 1.5 hours including H&P.  Principal Problem: Suicidal ideation  Diagnosis:  Principal Problem:   Suicidal ideation  Subjective Data: See H&P.  Continued Clinical Symptoms:  Alcohol Use Disorder Identification Test Final Score (AUDIT): 8 The Alcohol Use Disorders Identification Test, Guidelines for Use in Primary Care, Second Edition.  World [employer] Writer [facility]). Score between 0-7:  no or low risk or alcohol related problems. Score between 8-15:  moderate risk of alcohol related problems. Score between 16-19:  high risk of alcohol related problems. Score 20 or above:  warrants further diagnostic evaluation for alcohol dependence and treatment.  CLINICAL FACTORS:   Severe Anxiety and/or Agitation Depression:   Comorbid alcohol abuse/dependence Hopelessness Insomnia Alcohol/Substance Abuse/Dependencies More than one psychiatric diagnosis Currently Psychotic Unstable or Poor Therapeutic Relationship Previous Psychiatric Diagnoses and Treatments  Musculoskeletal: Strength & Muscle Tone: within normal limits Gait & Station: normal Patient leans: N/A  Psychiatric Specialty Exam:  Presentation  General Appearance:  Appropriate for Environment; Fairly Groomed  Eye Contact: Minimal  Speech: Clear and Coherent; Slow  Speech Volume: Decreased  Handedness: Right   Mood and Affect  Mood: Depressed  Affect: Appropriate; Congruent   Thought Process  Thought Processes: Coherent  Descriptions of Associations:Intact  Orientation:Full (Time, Place and Person)  Thought Content:WDL  History of  Schizophrenia/Schizoaffective disorder:No  Duration of Psychotic Symptoms:Less than six months  Hallucinations:Hallucinations: Auditory Description of Auditory Hallucinations: telling me to go away and end my life  Ideas of Reference:None  Suicidal Thoughts:Suicidal Thoughts: Yes, Passive SI Passive Intent and/or Plan: With Plan  Homicidal Thoughts:Homicidal Thoughts: No  Sensorium  Memory: Immediate Good  Judgment: Poor  Insight: Fair  Chartered Certified Accountant: Fair  Attention Span: Fair  Recall: [employer] of Knowledge: Fair  Language: Fair  Psychomotor Activity  Psychomotor Activity: Psychomotor Activity: Normal  Assets  Assets: Manufacturing Systems Engineer; Desire for Improvement; Physical Health; Resilience  Sleep  Sleep: Sleep: Poor  Physical Exam: See H&P.  Blood pressure 130/87, pulse 77, temperature 97.7 F (36.5 C), temperature source Oral, resp. rate 18, height 5' 7 (1.702 m), weight 76.2 kg, last menstrual period 04/12/2021, SpO2 100%. Body mass index is 26.31 kg/m.  COGNITIVE FEATURES THAT CONTRIBUTE TO RISK:  Closed-mindedness, Polarized thinking, and Thought constriction (tunnel vision)    SUICIDE RISK:   Severe:  Frequent, intense, and enduring suicidal ideation, specific plan, no subjective intent, but some objective markers of intent (i.e., choice of lethal method), the method is accessible, some limited preparatory behavior, evidence of impaired self-control, severe dysphoria/symptomatology, multiple risk factors present, and few if any protective factors, particularly a lack of social support.  PLAN OF CARE: See H&P.  I certify that inpatient services furnished can reasonably be expected to improve the patient's condition.   Mac Bolster, NP, pmhnp, fnp-bc. 12/16/2024, 11:10 AM

## 2024-12-16 NOTE — Plan of Care (Signed)
" °  Problem: Education: Goal: Knowledge of [facility] Education information/materials will improve 12/16/2024 1908 by Inda Berwyn RAMAN, RN Outcome: Progressing 12/16/2024 1908 by Inda Berwyn RAMAN, RN Outcome: Progressing Goal: Verbalization of understanding the information provided will improve Outcome: Progressing   Problem: Activity: Goal: Interest or engagement in activities will improve Outcome: Progressing   "

## 2024-12-16 NOTE — Group Note (Signed)
 LCSW Group Therapy Note   Group Date: 12/16/2024 Start Time: 1100 End Time: 1200   Participation:  did not attend   Type of Therapy:  Group Therapy   Topic:  Stronger Together:  Building Healthy Relationships  Objective:  To explore loneliness, boundaries, and safe ways to build relationships.  Goals: Recognize healthy vs. unhealthy relationships. Learn safe ways to connect with others. Strengthen communication and murphy oil.  Summary:  Participants discussed loneliness, healthy connections, and setting boundaries. They explored safe ways to meet people and shared personal experiences. Key insights were reinforced through discussion and quotes.  Therapeutic Modalities Used: - Cognitive Behavioral Therapy (CBT) Elements - Identifying unhealthy relationship patterns, challenging negative thoughts about connection. - Dialectical Behavior Therapy (DBT) Elements - Interpersonal effectiveness, setting and maintaining boundaries. - Supportive Group Therapy - Peer discussion, shared experiences, and emotional validation.   Dajuana Palen O Quantel Mcinturff, LCSWA 12/16/2024  12:21 PM

## 2024-12-16 NOTE — Progress Notes (Addendum)
 D. Pt presents with a sad affect , depressed mood, calm, cooperative behavior- friendly during interactions- reported feeling tired today due to inadequate sleep, but did attend one group this afternoon and has gone to meals. Pt currently denies SI/HI  and A/VH, but reports that she  hears voices putting her down , 'negative talk' often. Pt also endorses racing thoughts and increased anxiety- was given hydroxyzine  , but reported only a little relief. A. Labs and vitals monitored. Pt given and educated on medications. Pt supported emotionally and encouraged to express concerns and ask questions.   R. Pt remains safe with 15 minute checks. Will continue POC.

## 2024-12-16 NOTE — Group Note (Signed)
 Date:  12/16/2024 Time:  9:25 AM  Group Topic/Focus: Goals Group  Patients were provided SMART goals worksheets and encouraged to introduce themselves using an icebreaker (What animal were you in a past life?). Patients then shared their SMART goals with the group, which promoted peer support, encouragement, and positive socialization.   Participation Level:  Did Not Attend  Additional Comments:  Pt did not attend goals group  Kristi HERO [facility] 12/16/2024, 9:25 AM

## 2024-12-16 NOTE — [facility] Group Notes (Signed)
 [facility] Group Notes:  (Nursing/MHT/Case Management/Adjunct)  Date:  12/16/2024  Time:  10:20 PM  Type of Therapy:  Psychoeducational Skills  Participation Level:  Active  Participation Quality:  Appropriate  Affect:  Flat  Cognitive:  Appropriate  Insight:  Improving  Engagement in Group:  Developing/Improving  Modes of Intervention:  Education  Summary of Progress/Problems: The patient rated her day as a 6 out of a possible 10 since she is feeling better. Her positive event for the day is that she is having fewer negative thoughts. Her goal for tomorrow is to feel better mentally.   Da Authement S 12/16/2024, 10:20 PM

## 2024-12-16 NOTE — [facility] Suicide Risk Assessment (Signed)
 [facility] INPATIENT:  Family/Significant Other Suicide Prevention Education  Suicide Prevention Education:  Patient Refusal for Family/Significant Other Suicide Prevention Education: The patient Anna [name] has refused to provide written consent for family/significant other to be provided Family/Significant Other Suicide Prevention Education during admission and/or prior to discharge.  Physician notified.  Derick JONELLE Blanch 12/16/2024, 3:17 PM

## 2024-12-16 NOTE — [facility] Group Notes (Signed)
 Pt did not attend CSW group (12/16/24, 1100-1200)

## 2024-12-16 NOTE — [facility] Group Notes (Signed)
 Pt attended occupational therapy group (12/16/24, 1500-1540)
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
## 2024-12-16 NOTE — Progress Notes (Signed)
 EKG results placed on the outside of pt's shadow chart  Normal sinus rhythm Normal ECG  QT/QTc-Baz    432/466 ms

## 2024-12-16 NOTE — Plan of Care (Addendum)
 ,

## 2024-12-16 NOTE — Plan of Care (Signed)
   Problem: Education: Goal: Knowledge of [facility] Education information/materials will improve Outcome: Progressing Goal: Emotional status will improve Outcome: Progressing Goal: Mental status will improve Outcome: Progressing Goal: Verbalization of understanding the information provided will improve Outcome: Progressing

## 2024-12-16 NOTE — Group Note (Signed)
 Occupational Therapy Group Note  Group Topic:Coping Skills  Group Date: 12/16/2024 Start Time: 1500 End Time: 1530 Facilitators: [name] Anna MATSU, OT   Group Description: Group encouraged increased engagement and participation through discussion and activity focused on Coping Ahead. Patients were split up into teams and selected a card from a stack of positive coping strategies. Patients were instructed to act out/charade the coping skill for other peers to guess and receive points for their team. Discussion followed with a focus on identifying additional positive coping strategies and patients shared how they were going to cope ahead over the weekend while continuing hospitalization stay.  Therapeutic Goal(s): Identify positive vs negative coping strategies. Identify coping skills to be used during hospitalization vs coping skills outside of hospital/at home Increase participation in therapeutic group environment and promote engagement in treatment   Participation Level: Engaged   Participation Quality: Independent   Behavior: Appropriate   Speech/Thought Process: Relevant   Affect/Mood: Appropriate   Insight: Fair   Judgement: Fair      Modes of Intervention: Education  Patient Response to Interventions:  Attentive   Plan: Continue to engage patient in OT groups 2 - 3x/week.  12/16/2024  Anna [name], OT  Anna [name], OT
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
## 2024-12-16 NOTE — [facility] Group Notes (Signed)
 Pt did not attend the nutrition group (12/16/24, [id number])

## 2024-12-17 ENCOUNTER — Encounter (HOSPITAL_COMMUNITY): Payer: Self-pay

## 2024-12-17 DIAGNOSIS — F1490 Cocaine use, unspecified, uncomplicated: Secondary | ICD-10-CM | POA: Diagnosis not present

## 2024-12-17 DIAGNOSIS — F1290 Cannabis use, unspecified, uncomplicated: Secondary | ICD-10-CM | POA: Diagnosis not present

## 2024-12-17 DIAGNOSIS — F333 Major depressive disorder, recurrent, severe with psychotic symptoms: Secondary | ICD-10-CM | POA: Diagnosis not present

## 2024-12-17 DIAGNOSIS — R45851 Suicidal ideations: Secondary | ICD-10-CM | POA: Diagnosis not present

## 2024-12-17 LAB — URINALYSIS, ROUTINE W REFLEX MICROSCOPIC
Bacteria, UA: NONE SEEN
Bilirubin Urine: NEGATIVE
Glucose, UA: NEGATIVE mg/dL
Ketones, ur: NEGATIVE mg/dL
Nitrite: NEGATIVE
Protein, ur: 100 mg/dL — AB
Specific Gravity, Urine: 1.013 (ref 1.005–1.030)
pH: 7 (ref 5.0–8.0)

## 2024-12-17 MED ORDER — WHITE PETROLATUM EX OINT
TOPICAL_OINTMENT | CUTANEOUS | Status: AC
  Filled 2024-12-17: qty 5

## 2024-12-17 MED ORDER — GABAPENTIN 100 MG PO CAPS
200.0000 mg | ORAL_CAPSULE | Freq: Three times a day (TID) | ORAL | Status: DC
  Administered 2024-12-17 – 2024-12-24 (×21): 200 mg via ORAL
  Filled 2024-12-17 (×7): qty 2
  Filled 2024-12-17: qty 42
  Filled 2024-12-17 (×14): qty 2

## 2024-12-17 MED ORDER — TRAZODONE HCL 150 MG PO TABS
150.0000 mg | ORAL_TABLET | Freq: Every evening | ORAL | Status: DC | PRN
  Administered 2024-12-17: 150 mg via ORAL
  Filled 2024-12-17: qty 1

## 2024-12-17 MED ORDER — GABAPENTIN 100 MG PO CAPS
ORAL_CAPSULE | ORAL | Status: AC
  Filled 2024-12-17: qty 2

## 2024-12-17 NOTE — Progress Notes (Signed)
(  Sleep Hours) -7.5 as of 0530 (Any PRNs that were needed, meds refused, or side effects to meds)- prn hydroxyzine  and trazodone  @2109  (Any disturbances and when (visitation, over night)-none (Concerns raised by the patient)- none (SI/HI/AVH)- denies all

## 2024-12-17 NOTE — BH IP Treatment Plan (Addendum)
 Interdisciplinary Treatment and Diagnostic Plan Update  12/17/2024 Time of Session: 10:00 AM Anna [name] MRN: [id number]  Principal Diagnosis: Major depressive disorder, recurrent episode, severe, with psychosis (HCC)  Secondary Diagnoses: Principal Problem:   Major depressive disorder, recurrent episode, severe, with psychosis (HCC) Active Problems:   Cocaine use disorder (HCC)   Suicidal ideation   Cannabis use disorder   Current Medications:  Current Facility-Administered Medications  Medication Dose Route Frequency Provider Last Rate Last Admin   acetaminophen  (TYLENOL ) tablet 650 mg  650 mg Oral Q6H PRN Ramzan, Mariam, NP       alum & mag hydroxide-simeth (MAALOX/MYLANTA) 200-200-20 MG/5ML suspension 30 mL  30 mL Oral Q4H PRN Ramzan, Mariam, NP       haloperidol  (HALDOL ) tablet 5 mg  5 mg Oral TID PRN Ramzan, Mariam, NP       And   diphenhydrAMINE  (BENADRYL ) capsule 50 mg  50 mg Oral TID PRN Ramzan, Mariam, NP       haloperidol  lactate (HALDOL ) injection 5 mg  5 mg Intramuscular TID PRN Ramzan, Mariam, NP       And   diphenhydrAMINE  (BENADRYL ) injection 50 mg  50 mg Intramuscular TID PRN Ramzan, Mariam, NP       And   LORazepam  (ATIVAN ) injection 2 mg  2 mg Intramuscular TID PRN Ramzan, Mariam, NP       haloperidol  lactate (HALDOL ) injection 10 mg  10 mg Intramuscular TID PRN Ramzan, Mariam, NP       And   diphenhydrAMINE  (BENADRYL ) injection 50 mg  50 mg Intramuscular TID PRN Ramzan, Mariam, NP       And   LORazepam  (ATIVAN ) injection 2 mg  2 mg Intramuscular TID PRN Ramzan, Mariam, NP       gabapentin  (NEURONTIN ) 100 MG capsule            gabapentin  (NEURONTIN ) capsule 200 mg  200 mg Oral TID Nwoko, Agnes I, NP   200 mg at 12/17/24 1300   hydrOXYzine  (ATARAX ) tablet 25 mg  25 mg Oral TID PRN Ramzan, Mariam, NP   25 mg at 12/16/24 2109   magnesium  hydroxide (MILK OF MAGNESIA) suspension 30 mL  30 mL Oral Daily PRN Ramzan, Mariam, NP       sertraline  (ZOLOFT ) tablet 50 mg   50 mg Oral Daily Nwoko, Agnes I, NP   50 mg at 12/17/24 0827   traZODone  (DESYREL ) tablet 150 mg  150 mg Oral QHS PRN Nwoko, Agnes I, NP       PTA Medications: Medications Prior to Admission  Medication Sig Dispense Refill Last Dose/Taking   amLODipine  (NORVASC ) 5 MG tablet Take 5 mg by mouth daily. (Patient not taking: Reported on 12/15/2024)      sertraline  (ZOLOFT ) 100 MG tablet Take 1 tablet (100 mg total) by mouth daily. (Patient not taking: Reported on 12/15/2024) 90 tablet 1    traZODone  (DESYREL ) 150 MG tablet Take 1 tablet (150 mg total) by mouth at bedtime as needed for sleep. (Patient not taking: Reported on 12/15/2024) 90 tablet 1     Patient Stressors: Substance abuse    Patient Strengths: Motivation for treatment/growth   Treatment Modalities: Medication Management, Group therapy, Case management,  1 to 1 session with clinician, Psychoeducation, Recreational therapy.   Physician Treatment Plan for Primary Diagnosis: Major depressive disorder, recurrent episode, severe, with psychosis (HCC) Long Term Goal(s): Improvement in symptoms so as ready for discharge   Short Term Goals: Ability to identify and develop effective  coping behaviors will improve Ability to maintain clinical measurements within normal limits will improve Compliance with prescribed medications will improve Ability to identify triggers associated with substance abuse/mental health issues will improve Ability to identify changes in lifestyle to reduce recurrence of condition will improve Ability to verbalize feelings will improve Ability to disclose and discuss suicidal ideas Ability to demonstrate self-control will improve  Medication Management: Evaluate patient's response, side effects, and tolerance of medication regimen.  Therapeutic Interventions: 1 to 1 sessions, Unit Group sessions and Medication administration.  Evaluation of Outcomes: Not Progressing  Physician Treatment Plan for Secondary  Diagnosis: Principal Problem:   Major depressive disorder, recurrent episode, severe, with psychosis (HCC) Active Problems:   Cocaine use disorder (HCC)   Suicidal ideation   Cannabis use disorder  Long Term Goal(s): Improvement in symptoms so as ready for discharge   Short Term Goals: Ability to identify and develop effective coping behaviors will improve Ability to maintain clinical measurements within normal limits will improve Compliance with prescribed medications will improve Ability to identify triggers associated with substance abuse/mental health issues will improve Ability to identify changes in lifestyle to reduce recurrence of condition will improve Ability to verbalize feelings will improve Ability to disclose and discuss suicidal ideas Ability to demonstrate self-control will improve     Medication Management: Evaluate patient's response, side effects, and tolerance of medication regimen.  Therapeutic Interventions: 1 to 1 sessions, Unit Group sessions and Medication administration.  Evaluation of Outcomes: Not Progressing   RN Treatment Plan for Primary Diagnosis: Major depressive disorder, recurrent episode, severe, with psychosis (HCC) Long Term Goal(s): Knowledge of disease and therapeutic regimen to maintain health will improve  Short Term Goals: Ability to remain free from injury will improve, Ability to verbalize frustration and anger appropriately will improve, Ability to demonstrate self-control, Ability to participate in decision making will improve, Ability to verbalize feelings will improve, Ability to disclose and discuss suicidal ideas, Ability to identify and develop effective coping behaviors will improve, and Compliance with prescribed medications will improve  Medication Management: RN will administer medications as ordered by provider, will assess and evaluate patient's response and provide education to patient for prescribed medication. RN will report  any adverse and/or side effects to prescribing provider.  Therapeutic Interventions: 1 on 1 counseling sessions, Psychoeducation, Medication administration, Evaluate responses to treatment, Monitor vital signs and CBGs as ordered, Perform/monitor CIWA, COWS, AIMS and Fall Risk screenings as ordered, Perform wound care treatments as ordered.  Evaluation of Outcomes: Not Progressing   LCSW Treatment Plan for Primary Diagnosis: Major depressive disorder, recurrent episode, severe, with psychosis (HCC) Long Term Goal(s): Safe transition to appropriate next level of care at discharge, Engage patient in therapeutic group addressing interpersonal concerns.  Short Term Goals: Engage patient in aftercare planning with referrals and resources, Increase social support, Increase ability to appropriately verbalize feelings, Increase emotional regulation, Facilitate acceptance of mental health diagnosis and concerns, Facilitate patient progression through stages of change regarding substance use diagnoses and concerns, Identify triggers associated with mental health/substance abuse issues, and Increase skills for wellness and recovery  Therapeutic Interventions: Assess for all discharge needs, 1 to 1 time with Social worker, Explore available resources and support systems, Assess for adequacy in community support network, Educate family and significant other(s) on suicide prevention, Complete Psychosocial Assessment, Interpersonal group therapy.  Evaluation of Outcomes: Not Progressing   Progress in Treatment: Attending groups: No. Participating in groups: No. Taking medication as prescribed: Yes. Toleration medication: Yes. Family/Significant other  contact made: No, will contact:  patient declined to provide consents at this time Patient understands diagnosis: Yes. Discussing patient identified problems/goals with staff: Yes. Medical problems stabilized or resolved: Yes. Denies suicidal/homicidal  ideation: Yes. Issues/concerns per patient self-inventory: No.  New problem(s) identified:  No  New Short Term/Long Term Goal(s):    medication stabilization, elimination of SI thoughts, development of comprehensive mental wellness plan.    Patient Goals:  I want to get my medications stabilized.  Discharge Plan or Barriers:  Patient recently admitted. CSW will continue to follow and assess for appropriate referrals and possible discharge planning.    Reason for Continuation of Hospitalization: Depression Medication stabilization Suicidal ideation  Estimated Length of Stay:  5 - 7 days  Last 3 [place] Suicide Severity Risk Score: Flowsheet Row Ad[facility] (Current) [facility] 12[facility]14/2024 in [facility] from 10/20/2024 in [facility] RISK CATEGORY Error: Q3, 4, or 5 should not be populated when Q2 is No Error: Q3, 4, or 5 should not be populated when Q2 is No Error: Question 6 not populated    Last PHQ 2/9 Scores:    09/10/2023    3:58 PM 02/11/2023   10:54 AM 11/13/2022   11:15 AM  Depression screen PHQ 2/9  Decreased Interest 2 1 2   Down, Depressed, Hopeless 2 2 3   PHQ - 2 Score 4 3 5   Altered sleeping 1 3 2   Tired, decreased energy 0 0 0  Change in appetite 0 0 0  Feeling bad or failure about yourself  2 2 3   Trouble concentrating 1 1 3   Moving slowly or fidgety/restless 0 1 2  Suicidal thoughts 1 1 2   PHQ-9 Score 9  11  17    Difficult doing work/chores   Somewhat difficult     Data saved with a previous flowsheet row definition    Scribe for Treatment Team: Asaad Gulley O Mitsue Peery, LCSW 12/17/2024 4:25 PM

## 2024-12-17 NOTE — Group Note (Signed)
 Recreation Therapy Group Note   Group Topic:Problem Solving  Group Date: 12/17/2024 Start Time: 0935 End Time: 1003 Facilitators: Kalkidan Caudell-McCall, LRT,CTRS Location: [address] Dayroom   Group Topic: Communication, Team Building, Problem Solving  Goal Area(s) Addresses:  Patient will effectively work with peer towards shared goal.  Patient will identify skills used to make activity successful.  Patient will identify how skills used during activity can be used to reach post d/c goals.   Behavioral Response:   Intervention: STEM Activity  Activity: Straw Bridge. In teams of 3-5, patients were given 15 plastic drinking straws and an equal length of masking tape. Using the materials provided, patients were instructed to build a free standing bridge-like structure to suspend an everyday item (ex: puzzle box) off of the floor or table surface. All materials were required to be used by the team in their design. LRT facilitated post-activity discussion reviewing team process. Patients were encouraged to reflect how the skills used in this activity can be generalized to daily life post discharge.   Education: Pharmacist, Community, Scientist, Physiological, Discharge Planning   Education Outcome: Acknowledges education/In group clarification offered/Needs additional education.    Affect/Mood: N/A   Participation Level: Did not attend    Clinical Observations/Individualized Feedback:      Plan: Continue to engage patient in RT group sessions 2-3x/week.   Tamica Covell-McCall, LRT,CTRS 12/17/2024 12:42 PM

## 2024-12-17 NOTE — Group Note (Signed)
 Date:  12/17/2024 Time:  1:25 PM  Group Topic/Focus:  Goals Group:   The focus of this group is to help patients establish daily goals to achieve during treatment and discuss how the patient can incorporate goal setting into their daily lives to aide in recovery. Orientation:   The focus of this group is to educate the patient on the purpose and policies of crisis stabilization and provide a format to answer questions about their admission.  The group details unit policies and expectations of patients while admitted.    Participation Level:  Did Not Attend    Anna [name] 12/17/2024, 1:25 PM

## 2024-12-17 NOTE — Group Note (Signed)
 Date:  12/17/2024 Time:  1:43 PM  Group Topic/Focus:   Recreational Therapy:     Participation Level:  Did Not Attend   Cruz JONETTA Mars 12/17/2024, 1:43 PM

## 2024-12-17 NOTE — Progress Notes (Signed)
 [facility] MD Progress Note  12/17/2024 12:59 PM Anna [name]  MRN:  [id number]  Reason for  admission: 49 year old AA female with hx of major depressive disorder, anxiety disorder, cocaine use disorder & cannabis use disorder. She has been admitted & treated in this [facility] with complaint of homelessness, worsening suicidal ideations in 2023. At the time, after mood stabilization treatments, she was recommended to follow-up at the [facility]  for routine psychiatric care & medication management. Patient is being re-admitted to the [facility] this time with complaint of worsening suicidal ideations, auditory hallucinations & homelessness. A review of her current lab results has shown her UDS was positive for [facility] & cocaine. 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   Daily notes: Anna [name] is seen in her room. She is lying down in bed. She presents alert, oriented & aware of situation. She is visible on the unit, attending group sessions. She is making a fair eye contact & verbally responsive.She reports, My mood is not very good this morning. I'm not like agitated or irritated. I'm just not happy. I have a lot of anxiety going on. I did not sleep well last night. But, I have been attending group sessions. My depression & anxiety symptoms are being triggered by my situation outside the hospital. It is a hassle to try to survive on the streets without a home. My depression is #7 & anxiety is #9. I'm also sorry on how I acted or behaved yesterday towards you guys. I do believe that you guys are trying to help. Anna [name] currently denies any HI, VH. However, she continues to endorse passive suicidal ideations without plans or intent & auditory hallucinations. She says she is unable to make out what the voices were saying.  She denies any delusional thoughts or paranoia. She does not appear to be responding to any internal stimuli. Anna [name] is in agreement to try gabapentin  200 mg po tid for anxiety/substance withdrawal  syndrome. Her Trazodone  is increased to 150 mg po Q hs to aid his sleep.   Principal Problem: Major depressive disorder, recurrent episode, severe, with psychosis (HCC)  Diagnosis: Principal Problem:   Major depressive disorder, recurrent episode, severe, with psychosis (HCC) Active Problems:   Cocaine use disorder (HCC)   Suicidal ideation   Cannabis use disorder  Total Time spent with patient: 35 minutes.  Past Psychiatric History: MDD, cocaine use disorder, Cannabis use disorder.  Past Medical History:  Past Medical History:  Diagnosis Date   Anemia    Anxiety    Hypertension    only elevated at MD office per pt. no meds    Past Surgical History:  Procedure Laterality Date   ANKLE FRACTURE SURGERY Right    HYSTERECTOMY ABDOMINAL WITH SALPINGECTOMY Bilateral 05/22/2021   Procedure: HYSTERECTOMY ABDOMINAL WITH SALPINGECTOMY AND LEFT OOPHORECTOMY;  Surgeon: Lorence Ozell CROME, MD;  Location: [facility] OR;  Service: Gynecology;  Laterality: Bilateral;  TAP BLOCK   Family History:  Family History  Problem Relation Age of Onset   Hypertension Mother    Breast cancer Maternal Aunt 50   Heart failure Maternal Grandfather    Colon cancer Neg Hx    Colon polyps Neg Hx    Esophageal cancer Neg Hx    Rectal cancer Neg Hx    Stomach cancer Neg Hx    Family Psychiatric  History:  See H&P.  Social History:  Social History   Substance and Sexual Activity  Alcohol Use Yes   Comment: occassionally,  not often     Social History   Substance and Sexual Activity  Drug Use Yes   Types: Cocaine, Marijuana   Comment: occasional cocaine use per availability; cannabis use 2x weekly    Social History   Socioeconomic History   Marital status: Legally Separated    Spouse name: Not on file   Number of children: 3   Years of education: Not on file   Highest education level: Not on file  Occupational History   Not on file  Tobacco Use   Smoking status: Former    Types: Cigarettes    Smokeless tobacco: Never   Tobacco comments:    pt states quit 1-2 years ago   Vaping Use   Vaping status: Never Used  Substance and Sexual Activity   Alcohol use: Yes    Comment: occassionally, not often   Drug use: Yes    Types: Cocaine, Marijuana    Comment: occasional cocaine use per availability; cannabis use 2x weekly   Sexual activity: Not Currently    Birth control/protection: None  Other Topics Concern   Not on file  Social History Narrative   ** Merged History Encounter **       Social Drivers of Health   Tobacco Use: Medium Risk (12/15/2024)   Patient History    Smoking Tobacco Use: Former    Smokeless Tobacco Use: Never    Passive Exposure: Not on Actuary Strain: High Risk (11/13/2022)   Overall Financial Resource Strain (CARDIA)    Difficulty of Paying Living Expenses: Very hard  Food Insecurity: Food Insecurity Present (12/15/2024)   Epic    Worried About Programme Researcher, Broadcasting/film/video in the Last Year: Sometimes true    Ran Out of Food in the Last Year: Sometimes true  Transportation Needs: Unmet Transportation Needs (12/15/2024)   Epic    Lack of Transportation (Medical): Yes    Lack of Transportation (Non-Medical): Yes  Physical Activity: Insufficiently Active (11/13/2022)   Exercise Vital Sign    Days of Exercise per Week: 4 days    Minutes of Exercise per Session: 30 min  Stress: Stress Concern Present (11/13/2022)   [employer] of Occupational Health - Occupational Stress Questionnaire    Feeling of Stress : Very much  Social Connections: Socially Isolated (11/13/2022)   Social Connection and Isolation Panel    Frequency of Communication with Friends and Family: Once a week    Frequency of Social Gatherings with Friends and Family: Never    Attends Religious Services: Never    Database Administrator or Organizations: No    Attends Banker Meetings: Never    Marital Status: Separated  Depression (PHQ2-9): Medium Risk (09/10/2023)    Depression (PHQ2-9)    PHQ-2 Score: 9  Alcohol Screen: Medium Risk (12/15/2024)   Alcohol Screen    Last Alcohol Screening Score (AUDIT): 8  Housing: High Risk (12/15/2024)   Epic    Unable to Pay for Housing in the Last Year: Yes    Number of Times Moved in the Last Year: 3    Homeless in the Last Year: Yes  Utilities: At Risk (12/15/2024)   Epic    Threatened with loss of utilities: Yes  Health Literacy: Not on file   Additional Social History:   Sleep: Good Estimated Sleeping Duration (Last 24 Hours): 9.25-9.50 hours  Appetite:  Good  Current Medications: Current Facility-Administered Medications  Medication Dose Route Frequency Provider Last Rate Last Admin  acetaminophen  (TYLENOL ) tablet 650 mg  650 mg Oral Q6H PRN Ramzan, Mariam, NP       alum & mag hydroxide-simeth (MAALOX/MYLANTA) 200-200-20 MG/5ML suspension 30 mL  30 mL Oral Q4H PRN Ramzan, Mariam, NP       haloperidol  (HALDOL ) tablet 5 mg  5 mg Oral TID PRN Ramzan, Mariam, NP       And   diphenhydrAMINE  (BENADRYL ) capsule 50 mg  50 mg Oral TID PRN Ramzan, Mariam, NP       haloperidol  lactate (HALDOL ) injection 5 mg  5 mg Intramuscular TID PRN Ramzan, Mariam, NP       And   diphenhydrAMINE  (BENADRYL ) injection 50 mg  50 mg Intramuscular TID PRN Ramzan, Mariam, NP       And   LORazepam  (ATIVAN ) injection 2 mg  2 mg Intramuscular TID PRN Ramzan, Mariam, NP       haloperidol  lactate (HALDOL ) injection 10 mg  10 mg Intramuscular TID PRN Ramzan, Mariam, NP       And   diphenhydrAMINE  (BENADRYL ) injection 50 mg  50 mg Intramuscular TID PRN Ramzan, Mariam, NP       And   LORazepam  (ATIVAN ) injection 2 mg  2 mg Intramuscular TID PRN Ramzan, Mariam, NP       gabapentin  (NEURONTIN ) capsule 200 mg  200 mg Oral TID Collene Gouge I, NP       hydrOXYzine  (ATARAX ) tablet 25 mg  25 mg Oral TID PRN Ramzan, Mariam, NP   25 mg at 12/16/24 2109   magnesium  hydroxide (MILK OF MAGNESIA) suspension 30 mL  30 mL Oral Daily PRN Ramzan, Mariam,  NP       sertraline  (ZOLOFT ) tablet 50 mg  50 mg Oral Daily Rekia Kujala, Gouge I, NP   50 mg at 12/17/24 9172   traZODone  (DESYREL ) tablet 150 mg  150 mg Oral QHS PRN Collene Gouge I, NP       Lab Results:  Results for orders placed or performed during the hospital encounter of 12/15/24 (from the past 48 hours)  Urinalysis, Routine w reflex microscopic -Urine, Clean Catch     Status: Abnormal   Collection Time: 12/16/24  7:19 AM  Result Value Ref Range   Color, Urine YELLOW YELLOW   APPearance CLEAR CLEAR   Specific Gravity, Urine 1.013 1.005 - 1.030   pH 7.0 5.0 - 8.0   Glucose, UA NEGATIVE NEGATIVE mg/dL   Hgb urine dipstick SMALL (A) NEGATIVE   Bilirubin Urine NEGATIVE NEGATIVE   Ketones, ur NEGATIVE NEGATIVE mg/dL   Protein, ur 899 (A) NEGATIVE mg/dL   Nitrite NEGATIVE NEGATIVE   Leukocytes,Ua TRACE (A) NEGATIVE   RBC / HPF 0-5 0 - 5 RBC/hpf   WBC, UA 6-10 0 - 5 WBC/hpf   Bacteria, UA NONE SEEN NONE SEEN   Squamous Epithelial / HPF 0-5 0 - 5 /HPF   Mucus PRESENT    Hyaline Casts, UA PRESENT     Comment: Performed at [facility], [address], [place], [place] [address]   Blood Alcohol level:  Lab Results  Component Value Date   ETH 278 (H) 07/23/2024   ETH 49 (H) 09/12/2022   Metabolic Disorder Labs: Lab Results  Component Value Date   HGBA1C 5.1 09/12/2022   MPG 99.67 09/12/2022   MPG 99.67 05/29/2022   Lab Results  Component Value Date   PROLACTIN 5.6 05/29/2022   Lab Results  Component Value Date   CHOL 216 (H) 09/10/2023  TRIG 60 09/10/2023   HDL 104 09/10/2023   CHOLHDL 2.1 09/10/2023   VLDL 13 09/12/2022   LDLCALC 101 (H) 09/10/2023   LDLCALC NOT CALCULATED 09/12/2022   Physical Findings: AIMS:  ,  ,  ,  ,  ,  ,   CIWA:    COWS:     Musculoskeletal: Strength & Muscle Tone: within normal limits Gait & Station: normal Patient leans: N/A  Psychiatric Specialty Exam:  Presentation  General Appearance:  Casual; Fairly  Groomed  Eye Contact: Fair  Speech: Clear and Coherent; Normal Rate  Speech Volume: Normal  Handedness: Right   Mood and Affect  Mood: Depressed; Anxious  Affect: Congruent; Depressed   Thought Process  Thought Processes: Coherent; Goal Directed; Linear  Descriptions of Associations:Intact  Orientation:Full (Time, Place and Person)  Thought Content:Logical  History of Schizophrenia/Schizoaffective disorder:No  Duration of Psychotic Symptoms:N/A  Hallucinations:Hallucinations: Auditory Description of Auditory Hallucinations: I can't make out what they voices are saying.  Ideas of Reference:None  Suicidal Thoughts:Suicidal Thoughts: No  Homicidal Thoughts:Homicidal Thoughts: No   Sensorium  Memory: Immediate Good; Recent Good; Remote Good  Judgment: Fair  Insight: Fair   Chartered Certified Accountant: Fair  Attention Span: Fair  Recall: Good  Fund of Knowledge: Fair  Language: Good  Psychomotor Activity  Psychomotor Activity:Psychomotor Activity: Normal  Assets  Assets: Communication Skills; Desire for Improvement; Resilience  Sleep  Sleep:Sleep: Fair Number of Hours of Sleep: 6  Physical Exam: Physical Exam Vitals and nursing note reviewed.  HENT:     Nose: Nose normal.     Mouth/Throat:     Pharynx: Oropharynx is clear.  Cardiovascular:     Rate and Rhythm: Normal rate.     Pulses: Normal pulses.  Pulmonary:     Effort: Pulmonary effort is normal.  Genitourinary:    Comments: Deferred. Musculoskeletal:        General: Normal range of motion.     Cervical back: Normal range of motion.  Skin:    General: Skin is dry.  Neurological:     General: No focal deficit present.     Mental Status: She is alert and oriented to person, place, and time.    Review of Systems  Constitutional:  Negative for chills, diaphoresis and fever.  HENT:  Negative for congestion and sore throat.   Respiratory:  Negative for  cough, shortness of breath and wheezing.   Cardiovascular:  Negative for chest pain and palpitations.  Gastrointestinal:  Negative for abdominal pain, constipation, diarrhea, heartburn, nausea and vomiting.  Genitourinary:  Negative for dysuria.  Musculoskeletal:  Negative for joint pain and myalgias.  Skin:  Negative for itching and rash.  Neurological:  Negative for dizziness, tingling, tremors, sensory change, speech change, focal weakness, seizures, loss of consciousness, weakness and headaches.  Endo/Heme/Allergies:        Allergies: Peanuts, Latex.  Psychiatric/Behavioral:  Positive for depression and substance abuse. Negative for memory loss. The patient is nervous/anxious.    Blood pressure 130/76, pulse 70, temperature 98 F (36.7 C), temperature source Oral, resp. rate 20, height 5' 7 (1.702 m), weight 76.2 kg, last menstrual period 04/12/2021, SpO2 97%. Body mass index is 26.31 kg/m.  Treatment Plan Summary: Daily contact with patient to assess and evaluate symptoms and progress in treatment and Medication management.   Principal/active diagnoses. Principal Problem: Major depressive disorder, recurrent episode, severe, with psychosis (HCC) Cocaine use disorder (HCC) Cannabis use disorder.    Associated symptoms.   Suicidal ideation  Plan: The risks/benefits/side-effects/alternatives to the medications in use were discussed in detail with the patient and time was given for patient's questions. The patient consents to medication trial.    -Continue Sertraline  50 mg po daily for depression.  -Continue Hydroxyzine  25 mg po tid prn for anxiety.  -Increased Trazodone  from 100 mg to 150 mg po Q hs prn for insomnia.  -Initiated gabapentin  200 mg po tid for agitation/anxiety.   Agitation:  -Continue as recommended (see Mar).   Other PRNS -Continue Tylenol  650 mg every 6 hours PRN for mild pain -Continue Maalox 30 ml Q 4 hrs PRN for indigestion -Continue MOM 30 ml po Q 6 hrs  for constipation   Safety and Monitoring: Voluntary admission to inpatient psychiatric unit for safety, stabilization and treatment Daily contact with patient to assess and evaluate symptoms and progress in treatment Patient's case to be discussed in multi-disciplinary team meeting Observation Level : q15 minute checks Vital signs: q12 hours Precautions: Safety   Discharge Planning: Social work and case management to assist with discharge planning and identification of hospital follow-up needs prior to discharge Estimated LOS: 5-7 days Discharge Concerns: Need to establish a safety plan; Medication compliance and effectiveness Discharge Goals: Return home with outpatient referrals for mental health follow-up including medication management/psychotherapy  Mac Bolster, NP, pmhnp, fnp-bc. 12/17/2024, 12:59 PM

## 2024-12-17 NOTE — Group Note (Signed)
 Date:  12/17/2024 Time:  6:45 PM  Group Topic/Focus: The diet's effect on mental wellness. Self Care:   The focus of this group is to help patients understand the importance of self-care in order to improve or restore emotional, physical, spiritual, interpersonal, and financial health.       Participation Level:  Active  Participation Quality:  Appropriate  Affect:  Appropriate  Cognitive:  Appropriate  Insight: Appropriate  Engagement in Group:  Engaged  Modes of Intervention:  Discussion and Education  Additional Comments:     Juliene CHRISTELLA Huddle 12/17/2024, 6:45 PM

## 2024-12-17 NOTE — Group Note (Signed)
 Date:  12/17/2024 Time:  1:54 PM  Group Topic/Focus:   Social Wellness:   The focus of this group is to discuss boundaries, challenges in implementing them, as well as healthy ways to communicate boundaries with others.     Participation Level:  Did Not Attend   Anna [name] 12/17/2024, 1:54 PM

## 2024-12-17 NOTE — Plan of Care (Signed)
   Problem: Education: Goal: Knowledge of [facility] Education information/materials will improve Outcome: Progressing Goal: Emotional status will improve Outcome: Progressing Goal: Mental status will improve Outcome: Progressing Goal: Verbalization of understanding the information provided will improve Outcome: Progressing

## 2024-12-17 NOTE — [facility] Group Notes (Signed)
 Adult Psychoeducational Group Note  Date:  12/17/2024 Time:  8:42 PM  Group Topic/Focus:  Wrap-Up Group:   The focus of this group is to help patients review their daily goal of treatment and discuss progress on daily workbooks.  Participation Level:  Did not attend  Participation Quality:  Did not attend  Affect:  Did not attend    Cognitive:  did not attend  Insight: Did not attend  Engagement in Group:  Did not attend  Modes of Intervention:  Did not attend  Additional Comments:  did  not attend  Lang Drilling Long 12/17/2024, 8:42 PM

## 2024-12-17 NOTE — Progress Notes (Signed)
" °   12/17/24 0800  Psych Admission Type (Psych Patients Only)  Admission Status Voluntary  Psychosocial Assessment  Patient Complaints Anxiety;Depression  Eye Contact Brief  Facial Expression Flat  Affect Depressed  Speech Logical/coherent  Interaction Assertive  Motor Activity Other (Comment) (WDL)  Appearance/Hygiene In scrubs  Behavior Characteristics Cooperative;Appropriate to situation;Calm  Mood Depressed;Anxious  Thought Process  Coherency WDL  Content WDL  Delusions None reported or observed  Perception WDL  Hallucination None reported or observed  Judgment Impaired  Confusion None  Danger to Self  Current suicidal ideation? Denies  Agreement Not to Harm Self Yes  Description of Agreement Verbal  Danger to Others  Danger to Others None reported or observed    "

## 2024-12-18 DIAGNOSIS — F333 Major depressive disorder, recurrent, severe with psychotic symptoms: Secondary | ICD-10-CM | POA: Diagnosis not present

## 2024-12-18 DIAGNOSIS — R45851 Suicidal ideations: Secondary | ICD-10-CM | POA: Diagnosis not present

## 2024-12-18 DIAGNOSIS — F1290 Cannabis use, unspecified, uncomplicated: Secondary | ICD-10-CM | POA: Diagnosis not present

## 2024-12-18 DIAGNOSIS — F1490 Cocaine use, unspecified, uncomplicated: Secondary | ICD-10-CM | POA: Diagnosis not present

## 2024-12-18 MED ORDER — SERTRALINE HCL 100 MG PO TABS
100.0000 mg | ORAL_TABLET | Freq: Every day | ORAL | Status: DC
  Administered 2024-12-19 – 2024-12-24 (×6): 100 mg via ORAL
  Filled 2024-12-18: qty 1
  Filled 2024-12-18: qty 7
  Filled 2024-12-18 (×6): qty 1

## 2024-12-18 MED ORDER — AMLODIPINE BESYLATE 5 MG PO TABS
5.0000 mg | ORAL_TABLET | Freq: Every day | ORAL | Status: DC
  Administered 2024-12-18 – 2024-12-19 (×2): 5 mg via ORAL
  Filled 2024-12-18 (×2): qty 1

## 2024-12-18 MED ORDER — TRAZODONE HCL 150 MG PO TABS
150.0000 mg | ORAL_TABLET | Freq: Every day | ORAL | Status: DC
  Administered 2024-12-18 – 2024-12-23 (×6): 150 mg via ORAL
  Filled 2024-12-18: qty 1
  Filled 2024-12-18: qty 7
  Filled 2024-12-18 (×5): qty 1

## 2024-12-18 MED ORDER — WHITE PETROLATUM EX OINT
TOPICAL_OINTMENT | CUTANEOUS | Status: AC
  Administered 2024-12-18: 1
  Filled 2024-12-18: qty 5

## 2024-12-18 NOTE — Progress Notes (Signed)
 Patient ID: Anna [name], female   DOB: 03/07/1976, 49 y.o.   MRN: [id number] Patient provided resources for women shelters to include [facility] as requested.

## 2024-12-18 NOTE — Progress Notes (Signed)
(  Sleep Hours) -6.5 as of 0530 (Any PRNs that were needed, meds refused, or side effects to meds)- prn hydroxyzine  and trazodone  @ 2104 (Any disturbances and when (visitation, over night)-none (Concerns raised by the patient)- none (SI/HI/AVH)- denies all

## 2024-12-18 NOTE — Progress Notes (Signed)
" °   12/18/24 1100  Psych Admission Type (Psych Patients Only)  Admission Status Voluntary  Psychosocial Assessment  Patient Complaints Anxiety;Depression  Eye Contact Brief  Facial Expression Flat  Affect Appropriate to circumstance  Speech Logical/coherent  Interaction Assertive  Motor Activity Other (Comment) (WDL)  Appearance/Hygiene In scrubs  Behavior Characteristics Cooperative;Appropriate to situation;Calm  Mood Depressed;Anxious  Thought Process  Coherency WDL  Content WDL  Delusions None reported or observed  Perception WDL  Hallucination None reported or observed  Judgment Impaired  Confusion None  Danger to Self  Current suicidal ideation? Denies  Agreement Not to Harm Self Yes  Description of Agreement Verbal  Danger to Others  Danger to Others None reported or observed    "

## 2024-12-18 NOTE — Plan of Care (Signed)
   Problem: Education: Goal: Knowledge of [facility] Education information/materials will improve Outcome: Progressing Goal: Emotional status will improve Outcome: Progressing Goal: Mental status will improve Outcome: Progressing Goal: Verbalization of understanding the information provided will improve Outcome: Progressing

## 2024-12-18 NOTE — Progress Notes (Signed)
 [facility] MD Progress Note  12/18/2024 10:35 AM Anna [name]  MRN:  [id number]  Reason for  admission: 49 year old AA female with hx of major depressive disorder, anxiety disorder, cocaine use disorder & cannabis use disorder. She has been admitted & treated in this [facility] with complaint of homelessness, worsening suicidal ideations in 2023. At the time, after mood stabilization treatments, she was recommended to follow-up at the [facility]  for routine psychiatric care & medication management. Patient is being re-admitted to the [facility] this time with complaint of worsening suicidal ideations, auditory hallucinations & homelessness. A review of her current lab results has shown her UDS was positive for [facility] & cocaine. 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   Daily notes: Anna [name] is seen outside her room this morning. She is up & about, visible on the unit, attending group sessions. She presents alert, oriented & aware of situation. She is making an improved eye contact & verbally responsive. She reports, I'm still feeling very sad this morning. I'm not sure if my depression medicines is helping or not. The last time I was here, the depression medicine I took that time did not take much time for me to notice some improvement. I did not sleep too well last night. I would want my sleep medicine to be given to me at around 9:00 am. I really do not feel like going to a rehabilitation program after discharge. I have medical appointments that I would not want to miss because I went to rehab. The group sessions being offered here are very helpful. Homelessness out there on the streets can be very challenging. When homeless & on the streets, it is not hard to fall into a bad group that leads to other bad stuff. I'm on the housing list for 5 months now. The lady handling my case informed me that she may call me next week to update me on the process of getting me a place to stay. I would also like to have the number for  Leslie's house in [place], [place]. I would like to reach out to them. Jaelyne currently denies any SIHI, AVH, delusional thoughts or paranoia. She does not appear to be responding to any internal stimuli. She is taking & tolerating her treatment regimen. She denies any side effects. Her Sertraline  is increased to 100 mg po q daily. Her Trazodone  150 mg is scheduled at 09:00 pm. She says her anxiety is not as bad today. She is encouraged to continue her current plan of care as already in progress. Resumed on Norvasc  5 mg po daily for elevated blood pressure.  Principal Problem: Major depressive disorder, recurrent episode, severe, with psychosis (HCC)  Diagnosis: Principal Problem:   Major depressive disorder, recurrent episode, severe, with psychosis (HCC) Active Problems:   Cocaine use disorder (HCC)   Suicidal ideation   Cannabis use disorder  Total Time spent with patient: 35 minutes.  Past Psychiatric History: MDD, cocaine use disorder, Cannabis use disorder.  Past Medical History:  Past Medical History:  Diagnosis Date   Anemia    Anxiety    Hypertension    only elevated at MD office per pt. no meds    Past Surgical History:  Procedure Laterality Date   ANKLE FRACTURE SURGERY Right    HYSTERECTOMY ABDOMINAL WITH SALPINGECTOMY Bilateral 05/22/2021   Procedure: HYSTERECTOMY ABDOMINAL WITH SALPINGECTOMY AND LEFT OOPHORECTOMY;  Surgeon: Lorence Ozell CROME, MD;  Location: [facility] OR;  Service: Gynecology;  Laterality: Bilateral;  TAP BLOCK   Family History:  Family History  Problem Relation Age of Onset   Hypertension Mother    Breast cancer Maternal Aunt 50   Heart failure Maternal Grandfather    Colon cancer Neg Hx    Colon polyps Neg Hx    Esophageal cancer Neg Hx    Rectal cancer Neg Hx    Stomach cancer Neg Hx    Family Psychiatric  History:  See H&P.  Social History:  Social History   Substance and Sexual Activity  Alcohol Use Yes   Comment: occassionally, not often      Social History   Substance and Sexual Activity  Drug Use Yes   Types: Cocaine, Marijuana   Comment: occasional cocaine use per availability; cannabis use 2x weekly    Social History   Socioeconomic History   Marital status: Legally Separated    Spouse name: Not on file   Number of children: 3   Years of education: Not on file   Highest education level: Not on file  Occupational History   Not on file  Tobacco Use   Smoking status: Former    Types: Cigarettes   Smokeless tobacco: Never   Tobacco comments:    pt states quit 1-2 years ago   Vaping Use   Vaping status: Never Used  Substance and Sexual Activity   Alcohol use: Yes    Comment: occassionally, not often   Drug use: Yes    Types: Cocaine, Marijuana    Comment: occasional cocaine use per availability; cannabis use 2x weekly   Sexual activity: Not Currently    Birth control/protection: None  Other Topics Concern   Not on file  Social History Narrative   ** Merged History Encounter **       Social Drivers of Health   Tobacco Use: Medium Risk (12/15/2024)   Patient History    Smoking Tobacco Use: Former    Smokeless Tobacco Use: Never    Passive Exposure: Not on file  Financial Resource Strain: High Risk (11/13/2022)   Overall Financial Resource Strain (CARDIA)    Difficulty of Paying Living Expenses: Very hard  Food Insecurity: Food Insecurity Present (12/15/2024)   Epic    Worried About Programme Researcher, Broadcasting/film/video in the Last Year: Sometimes true    Ran Out of Food in the Last Year: Sometimes true  Transportation Needs: Unmet Transportation Needs (12/15/2024)   Epic    Lack of Transportation (Medical): Yes    Lack of Transportation (Non-Medical): Yes  Physical Activity: Insufficiently Active (11/13/2022)   Exercise Vital Sign    Days of Exercise per Week: 4 days    Minutes of Exercise per Session: 30 min  Stress: Stress Concern Present (11/13/2022)   [employer] of Occupational Health - Occupational Stress  Questionnaire    Feeling of Stress : Very much  Social Connections: Socially Isolated (11/13/2022)   Social Connection and Isolation Panel    Frequency of Communication with Friends and Family: Once a week    Frequency of Social Gatherings with Friends and Family: Never    Attends Religious Services: Never    Database Administrator or Organizations: No    Attends Banker Meetings: Never    Marital Status: Separated  Depression (PHQ2-9): Medium Risk (09/10/2023)   Depression (PHQ2-9)    PHQ-2 Score: 9  Alcohol Screen: Medium Risk (12/15/2024)   Alcohol Screen    Last Alcohol Screening Score (AUDIT): 8  Housing: High Risk (12/15/2024)  Epic    Unable to Pay for Housing in the Last Year: Yes    Number of Times Moved in the Last Year: 3    Homeless in the Last Year: Yes  Utilities: At Risk (12/15/2024)   Epic    Threatened with loss of utilities: Yes  Health Literacy: Not on file   Additional Social History:   Sleep: Good Estimated Sleeping Duration (Last 24 Hours): 9.00-9.75 hours  Appetite:  Good  Current Medications: Current Facility-Administered Medications  Medication Dose Route Frequency Provider Last Rate Last Admin   acetaminophen  (TYLENOL ) tablet 650 mg  650 mg Oral Q6H PRN Ramzan, Mariam, NP       alum & mag hydroxide-simeth (MAALOX/MYLANTA) 200-200-20 MG/5ML suspension 30 mL  30 mL Oral Q4H PRN Ramzan, Mariam, NP       haloperidol  (HALDOL ) tablet 5 mg  5 mg Oral TID PRN Ramzan, Mariam, NP       And   diphenhydrAMINE  (BENADRYL ) capsule 50 mg  50 mg Oral TID PRN Ramzan, Mariam, NP       haloperidol  lactate (HALDOL ) injection 5 mg  5 mg Intramuscular TID PRN Ramzan, Mariam, NP       And   diphenhydrAMINE  (BENADRYL ) injection 50 mg  50 mg Intramuscular TID PRN Ramzan, Mariam, NP       And   LORazepam  (ATIVAN ) injection 2 mg  2 mg Intramuscular TID PRN Ramzan, Mariam, NP       haloperidol  lactate (HALDOL ) injection 10 mg  10 mg Intramuscular TID PRN Ramzan,  Mariam, NP       And   diphenhydrAMINE  (BENADRYL ) injection 50 mg  50 mg Intramuscular TID PRN Ramzan, Mariam, NP       And   LORazepam  (ATIVAN ) injection 2 mg  2 mg Intramuscular TID PRN Ramzan, Mariam, NP       gabapentin  (NEURONTIN ) capsule 200 mg  200 mg Oral TID Cordarro Spinnato I, NP   200 mg at 12/18/24 1015   hydrOXYzine  (ATARAX ) tablet 25 mg  25 mg Oral TID PRN Ramzan, Mariam, NP   25 mg at 12/17/24 2104   magnesium  hydroxide (MILK OF MAGNESIA) suspension 30 mL  30 mL Oral Daily PRN Ramzan, Mariam, NP       sertraline  (ZOLOFT ) tablet 50 mg  50 mg Oral Daily Tamyah Cutbirth I, NP   50 mg at 12/18/24 1015   traZODone  (DESYREL ) tablet 150 mg  150 mg Oral QHS PRN Collene Gouge I, NP   150 mg at 12/17/24 2104   Lab Results:  No results found for this or any previous visit (from the past 48 hours).  Blood Alcohol level:  Lab Results  Component Value Date   ETH 278 (H) 07/23/2024   ETH 49 (H) 09/12/2022   Metabolic Disorder Labs: Lab Results  Component Value Date   HGBA1C 5.1 09/12/2022   MPG 99.67 09/12/2022   MPG 99.67 05/29/2022   Lab Results  Component Value Date   PROLACTIN 5.6 05/29/2022   Lab Results  Component Value Date   CHOL 216 (H) 09/10/2023   TRIG 60 09/10/2023   HDL 104 09/10/2023   CHOLHDL 2.1 09/10/2023   VLDL 13 09/12/2022   LDLCALC 101 (H) 09/10/2023   LDLCALC NOT CALCULATED 09/12/2022   Physical Findings: AIMS:  ,  ,  ,  ,  ,  ,   CIWA:    COWS:     Musculoskeletal: Strength & Muscle Tone: within normal limits Gait & Station:  normal Patient leans: N/A  Psychiatric Specialty Exam:  Presentation  General Appearance:  Casual; Fairly Groomed  Eye Contact: Fair  Speech: Clear and Coherent; Normal Rate  Speech Volume: Normal  Handedness: Right   Mood and Affect  Mood: Depressed; Anxious  Affect: Congruent; Depressed   Thought Process  Thought Processes: Coherent; Goal Directed; Linear  Descriptions of  Associations:Intact  Orientation:Full (Time, Place and Person)  Thought Content:Logical  History of Schizophrenia/Schizoaffective disorder:No  Duration of Psychotic Symptoms:N/A  Hallucinations:Hallucinations: Auditory Description of Auditory Hallucinations: I can't make out what they voices are saying.  Ideas of Reference:None  Suicidal Thoughts:Suicidal Thoughts: No  Homicidal Thoughts:Homicidal Thoughts: No   Sensorium  Memory: Immediate Good; Recent Good; Remote Good  Judgment: Fair  Insight: Fair   Chartered Certified Accountant: Fair  Attention Span: Fair  Recall: Good  Fund of Knowledge: Fair  Language: Good  Psychomotor Activity  Psychomotor Activity:Psychomotor Activity: Normal  Assets  Assets: Communication Skills; Desire for Improvement; Resilience  Sleep  Sleep:Sleep: Fair Number of Hours of Sleep: 6  Physical Exam: Physical Exam Vitals and nursing note reviewed.  HENT:     Nose: Nose normal.     Mouth/Throat:     Pharynx: Oropharynx is clear.  Cardiovascular:     Rate and Rhythm: Normal rate.     Pulses: Normal pulses.  Pulmonary:     Effort: Pulmonary effort is normal.  Genitourinary:    Comments: Deferred. Musculoskeletal:        General: Normal range of motion.     Cervical back: Normal range of motion.  Skin:    General: Skin is dry.  Neurological:     General: No focal deficit present.     Mental Status: She is alert and oriented to person, place, and time.    Review of Systems  Constitutional:  Negative for chills, diaphoresis and fever.  HENT:  Negative for congestion and sore throat.   Respiratory:  Negative for cough, shortness of breath and wheezing.   Cardiovascular:  Negative for chest pain and palpitations.  Gastrointestinal:  Negative for abdominal pain, constipation, diarrhea, heartburn, nausea and vomiting.  Genitourinary:  Negative for dysuria.  Musculoskeletal:  Negative for joint pain and  myalgias.  Skin:  Negative for itching and rash.  Neurological:  Negative for dizziness, tingling, tremors, sensory change, speech change, focal weakness, seizures, loss of consciousness, weakness and headaches.  Endo/Heme/Allergies:        Allergies: Peanuts, Latex.  Psychiatric/Behavioral:  Positive for depression and substance abuse. Negative for memory loss. The patient is nervous/anxious.    Blood pressure (!) 125/94, pulse 70, temperature 98 F (36.7 C), temperature source Oral, resp. rate 20, height 5' 7 (1.702 m), weight 76.2 kg, last menstrual period 04/12/2021, SpO2 100%. Body mass index is 26.31 kg/m.  Treatment Plan Summary: Daily contact with patient to assess and evaluate symptoms and progress in treatment and Medication management.   Principal/active diagnoses. Principal Problem: Major depressive disorder, recurrent episode, severe, with psychosis (HCC) Cocaine use disorder (HCC) Cannabis use disorder.    Associated symptoms.   Suicidal ideation  Plan: The risks/benefits/side-effects/alternatives to the medications in use were discussed in detail with the patient and time was given for patient's questions. The patient consents to medication trial.    -Increased Sertraline  from 50 mg to 100 mg po daily for depression.  -Continue Hydroxyzine  25 mg po tid prn for anxiety.  -Continue Trazodone  150 mg po Q hs (09:00 PM) for insomnia.  -Continue  gabapentin  200 mg po tid for agitation/anxiety.  -Resumed on Norvasc  5 mg po daily for HTN.   Agitation:  -Continue as recommended (see Mar).   Other PRNS -Continue Tylenol  650 mg every 6 hours PRN for mild pain -Continue Maalox 30 ml Q 4 hrs PRN for indigestion -Continue MOM 30 ml po Q 6 hrs for constipation   Safety and Monitoring: Voluntary admission to inpatient psychiatric unit for safety, stabilization and treatment Daily contact with patient to assess and evaluate symptoms and progress in treatment Patient's case to  be discussed in multi-disciplinary team meeting Observation Level : q15 minute checks Vital signs: q12 hours Precautions: Safety   Discharge Planning: Social work and case management to assist with discharge planning and identification of hospital follow-up needs prior to discharge Estimated LOS: 5-7 days Discharge Concerns: Need to establish a safety plan; Medication compliance and effectiveness Discharge Goals: Return home with outpatient referrals for mental health follow-up including medication management/psychotherapy  Mac Bolster, NP, pmhnp, fnp-bc. 12/18/2024, 10:35 AM Patient ID: [name], female   DOB: 1976-08-06, 49 y.o.   MRN: [id number]

## 2024-12-18 NOTE — Group Note (Signed)
 Date:  12/18/2024 Time:  9:38 PM  Group Topic/Focus:  Wrap-Up Group:   The focus of this group is to help patients review their daily goal of treatment and discuss progress on daily workbooks.    Participation Level:  Active  Participation Quality:  Attentive  Affect:  Appropriate  Cognitive:  Appropriate  Insight: Appropriate  Engagement in Group:  Engaged  Modes of Intervention:  Discussion  Additional Comments:  Patient stated she had a good day.Patient stated her day was an 8 out of 10.  Patient goal is to work on being more productive  Anna [name] 12/18/2024, 9:38 PM

## 2024-12-18 NOTE — Group Note (Signed)
 Date:  12/18/2024 Time:  6:24 PM  Group Topic/Focus:  Addiction and how our neurotransmitters are affected and the effects of them are manifested. Alternatives to getting illicit dopamine hits, natural ways to do so.   Participation Level:  Active  Participation Quality:  Appropriate  Affect:  Appropriate  Cognitive:  Appropriate  Insight: Appropriate  Engagement in Group:  Engaged  Modes of Intervention:  Discussion and Education  Additional Comments:    Anna [name] Huddle 12/18/2024, 6:24 PM

## 2024-12-18 NOTE — Group Note (Signed)
"                                                 [facility] LCSW Group Therapy Note    Group Date: 12/18/2024 Start Time: 1000 End Time: 1100  Type of Therapy and Topic:  Group Therapy:  Overcoming Obstacles  Participation Level:  [facility] PARTICIPATION LEVEL: Minimal  Mood:  Description of Group:   In this group patients will be encouraged to explore what they see as obstacles to their own wellness and recovery. They will be guided to discuss their thoughts, feelings, and behaviors related to these obstacles. The group will process together ways to cope with barriers, with attention given to specific choices patients can make. Each patient will be challenged to identify changes they are motivated to make in order to overcome their obstacles. This group will be process-oriented, with patients participating in exploration of their own experiences as well as giving and receiving support and challenge from other group members.  Therapeutic Goals: 1. Patient will identify personal and current obstacles as they relate to admission. 2. Patient will identify barriers that currently interfere with their wellness or overcoming obstacles.  3. Patient will identify feelings, thought process and behaviors related to these barriers. 4. Patient will identify two changes they are willing to make to overcome these obstacles:    Summary of Patient Progress   The patient share that she has to give herself some self care and learn how to love herself.   Therapeutic Modalities:   Cognitive Behavioral Therapy Solution Focused Therapy Motivational Interviewing Relapse Prevention Therapy   Marionette Meskill O Weston Fulco, LCSWA "

## 2024-12-18 NOTE — Group Note (Unsigned)
 Date:  12/18/2024 Time:  8:43 PM  Group Topic/Focus:  Wrap-Up Group:   The focus of this group is to help patients review their daily goal of treatment and discuss progress on daily workbooks.    Participation Level:  {B[facility] PARTICIPATION OZCZO:77735}  Participation Quality:  {B[facility] PARTICIPATION QUALITY:22265}  Affect:  {BHH AFFECT:22266}  Cognitive:  {B[facility] COGNITIVE:22267}  Insight: {BHH Insight2:20797}  Engagement in Group:  {BHH ENGAGEMENT IN HMNLE:77731}  Modes of Intervention:  {BHH MODES OF INTERVENTION:22269}  Additional Comments:  ***  Tijuana Scheidegger Dacosta 12/18/2024, 8:43 PM

## 2024-12-18 NOTE — Group Note (Signed)
 Date:  12/18/2024 Time:  9:18 AM  Group Topic/Focus:  Goals Group:   The focus of this group is to help patients establish daily goals to achieve during treatment and discuss how the patient can incorporate goal setting into their daily lives to aide in recovery. Orientation:   The focus of this group is to educate the patient on the purpose and policies of crisis stabilization and provide a format to answer questions about their admission.  The group details unit policies and expectations of patients while admitted.    Participation Level:  Did Not Attend

## 2024-12-19 DIAGNOSIS — F1490 Cocaine use, unspecified, uncomplicated: Secondary | ICD-10-CM | POA: Diagnosis not present

## 2024-12-19 DIAGNOSIS — R45851 Suicidal ideations: Secondary | ICD-10-CM | POA: Diagnosis not present

## 2024-12-19 DIAGNOSIS — F1290 Cannabis use, unspecified, uncomplicated: Secondary | ICD-10-CM | POA: Diagnosis not present

## 2024-12-19 DIAGNOSIS — F333 Major depressive disorder, recurrent, severe with psychotic symptoms: Secondary | ICD-10-CM | POA: Diagnosis not present

## 2024-12-19 MED ORDER — AMLODIPINE BESYLATE 5 MG PO TABS
10.0000 mg | ORAL_TABLET | Freq: Every day | ORAL | Status: DC
  Administered 2024-12-20 – 2024-12-24 (×5): 10 mg via ORAL
  Filled 2024-12-19: qty 2
  Filled 2024-12-19: qty 7
  Filled 2024-12-19 (×5): qty 2

## 2024-12-19 MED ORDER — WHITE PETROLATUM EX OINT
TOPICAL_OINTMENT | CUTANEOUS | Status: AC
  Filled 2024-12-19: qty 5

## 2024-12-19 MED ORDER — AMLODIPINE BESYLATE 5 MG PO TABS
5.0000 mg | ORAL_TABLET | Freq: Once | ORAL | Status: AC
  Administered 2024-12-19: 5 mg via ORAL
  Filled 2024-12-19: qty 1

## 2024-12-19 NOTE — Group Note (Signed)
 Date:  12/19/2024 Time:  7:36 PM  Group Topic/Focus:  Healthy Relationships - Description of what a healthy relationship looks like and how each person needs to be active in the relationship.   Participation Level:  Active  Participation Quality:  Appropriate  Affect:  Appropriate  Cognitive:  Appropriate  Insight: Appropriate  Engagement in Group:  Engaged  Modes of Intervention:  Discussion and Education  Additional Comments:    Anna [name] 12/19/2024, 7:36 PM

## 2024-12-19 NOTE — Progress Notes (Signed)
(  Sleep Hours) -7.25 as of 0530 (Any PRNs that were needed, meds refused, or side effects to meds)- prn tylenol  and hydroxyzine  @ 2305 (Any disturbances and when (visitation, over night)-none (Concerns raised by the patient)- none (SI/HI/AVH)- denies all

## 2024-12-19 NOTE — Group Note (Signed)
 Date:  12/19/2024 Time:  11:05 AM  Group Topic/Focus:   Emotional Education:   The focus of this group is to increase self-awareness, emotional expression, autonomy, and self esteem through discussion on control and a lyric substitution activity.   Participation Level:  None  Engagement in Group:  Limited  Modes of Intervention:  Activity and Discussion  Additional Comments:  Pt showed up for group. At the beginning of group a member of the pt's treatment team removed pt.  Gale Klar D Kearney Evitt 12/19/2024, 11:05 AM

## 2024-12-19 NOTE — Plan of Care (Signed)

## 2024-12-19 NOTE — [facility] Group Notes (Signed)
 [facility] Group Notes:  (Nursing/MHT/Case Management/Adjunct)  Date:  12/19/2024  Time:  11:17 PM  Type of Therapy:  Psychoeducational Skills  Participation Level:  Active  Participation Quality:  Attentive  Affect:  Appropriate  Cognitive:  Appropriate  Insight:  Limited  Engagement in Group:  Developing/Improving  Modes of Intervention:  Education  Summary of Progress/Problems: Patient rated her day as an 8 out of a possible 10 and states that she is working on improving herself. Her goal for tomorrow is to work on me.   Anna [name] S 12/19/2024, 11:17 PM

## 2024-12-19 NOTE — Plan of Care (Signed)
   Problem: Education: Goal: Knowledge of [facility] Education information/materials will improve Outcome: Progressing Goal: Emotional status will improve Outcome: Progressing Goal: Mental status will improve Outcome: Progressing

## 2024-12-19 NOTE — Progress Notes (Signed)
(  Sleep Hours) -8.75  (Any PRNs that were needed, meds refused, or side effects to meds)- hydroxyzine  25mg   (Any disturbances and when (visitation, over night)-none  (Concerns raised by the patient)- none  (SI/HI/AVH)-denies

## 2024-12-19 NOTE — Group Note (Signed)
 Date:  12/19/2024 Time:  6:17 PM  Group Topic/Focus:  Goals Group:   The focus of this group is to help patients establish daily goals to achieve during treatment and discuss how the patient can incorporate goal setting into their daily lives to aide in recovery. Orientation:   The focus of this group is to educate the patient on the purpose and policies of crisis stabilization and provide a format to answer questions about their admission.  The group details unit policies and expectations of patients while admitted.    Participation Level:  Did Not Attend   Anna [name] Mars 12/19/2024, 6:17 PM

## 2024-12-19 NOTE — Progress Notes (Signed)
" °   12/19/24 1000  Psych Admission Type (Psych Patients Only)  Admission Status Voluntary  Psychosocial Assessment  Patient Complaints None  Eye Contact Brief  Facial Expression Flat  Affect Appropriate to circumstance  Speech Logical/coherent  Interaction Assertive  Motor Activity Other (Comment) (WNL)  Appearance/Hygiene In scrubs;Unremarkable  Behavior Characteristics Cooperative;Appropriate to situation  Mood Depressed;Anxious;Pleasant  Thought Process  Coherency WDL  Content WDL  Delusions None reported or observed  Perception WDL  Hallucination None reported or observed  Judgment Impaired  Confusion None  Danger to Self  Current suicidal ideation? Denies  Agreement Not to Harm Self Yes  Description of Agreement verbal- notify staff  Danger to Others  Danger to Others None reported or observed    "

## 2024-12-19 NOTE — Group Note (Signed)
 Date:  12/19/2024 Time:  6:24 PM  Group Topic/Focus:  Physical Wellness:   This group focuses on introducing patients to Progressive Muscle Relaxation, a technique that involves tensing and then slowly releasing different muscle groups in the body. The goal is to help individuals become more aware of physical tension and stress in their body, and to learn how to consciously release it .   Participation Level:  Did Not Attend  Cruz Anna [name] 12/19/2024, 6:24 PM

## 2024-12-19 NOTE — Progress Notes (Signed)
 [facility] MD Progress Note  12/19/2024 4:44 PM Glynnis Gavel  MRN:  [id number]  Reason for  admission: 49 year old AA female with hx of major depressive disorder, anxiety disorder, cocaine use disorder & cannabis use disorder. She has been admitted & treated in this [facility] with complaint of homelessness, worsening suicidal ideations in 2023. At the time, after mood stabilization treatments, she was recommended to follow-up at the [facility]  for routine psychiatric care & medication management. Patient is being re-admitted to the [facility] this time with complaint of worsening suicidal ideations, auditory hallucinations & homelessness. A review of her current lab results has shown her UDS was positive for [facility] & cocaine. 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   Daily notes: Saraiya is in her room this morning. She is lying down in her bed. She is awake & alert, making a fair eye contact. She reports, I guess I'm alright. I feel very depressed this morning & I worry because my problems outside the hospital has not resolved. I got the [facility] phone number from the social worker. I will call them later today to see if they will have an accomodation for me. That is where I am with a place to stay after discharge. Bora currently denies any SIHI, AVH, delusional thoughts or paranoia. She does not appear to be responding to any internal stimuli. She is taking & tolerating her treatment regimen. She denies any side effects. Her Sertraline  was increased to 100 mg po q daily. Her Trazodone  was also increased to 150 mg & scheduled at 09:00 pm. She says her depression & anxiety symptoms are as a result of not having a concrete place to to go after discharge. Patient declines to go to a long term substance abuse treatment program after discharge. She states that she was using substances because it was the only way to cope with living on the streets. She says if she could get a place to stay, she will not be messing  with using illegal drugs. She also states that she has quite few medical appointments that she needs to keep, so going to a substance abuse treatment program will mean she will have to miss those appointments.  She is encouraged to continue her current plan of care as already in progress. Her Norvasc  is increased from 5 mg to 10 mg po daily for elevated blood pressure. See the treatment plan below.  Principal Problem: Major depressive disorder, recurrent episode, severe, with psychosis (HCC)  Diagnosis: Principal Problem:   Major depressive disorder, recurrent episode, severe, with psychosis (HCC) Active Problems:   Cocaine use disorder (HCC)   Suicidal ideation   Cannabis use disorder  Total Time spent with patient: 35 minutes.  Past Psychiatric History: MDD, cocaine use disorder, Cannabis use disorder.  Past Medical History:  Past Medical History:  Diagnosis Date   Anemia    Anxiety    Hypertension    only elevated at MD office per pt. no meds    Past Surgical History:  Procedure Laterality Date   ANKLE FRACTURE SURGERY Right    HYSTERECTOMY ABDOMINAL WITH SALPINGECTOMY Bilateral 05/22/2021   Procedure: HYSTERECTOMY ABDOMINAL WITH SALPINGECTOMY AND LEFT OOPHORECTOMY;  Surgeon: Lorence Ozell CROME, MD;  Location: [facility] OR;  Service: Gynecology;  Laterality: Bilateral;  TAP BLOCK   Family History:  Family History  Problem Relation Age of Onset   Hypertension Mother    Breast cancer Maternal Aunt 50   Heart failure Maternal Grandfather  Colon cancer Neg Hx    Colon polyps Neg Hx    Esophageal cancer Neg Hx    Rectal cancer Neg Hx    Stomach cancer Neg Hx    Family Psychiatric  History:  See H&P.  Social History:  Social History   Substance and Sexual Activity  Alcohol Use Yes   Comment: occassionally, not often     Social History   Substance and Sexual Activity  Drug Use Yes   Types: Cocaine, Marijuana   Comment: occasional cocaine use per availability; cannabis use  2x weekly    Social History   Socioeconomic History   Marital status: Legally Separated    Spouse name: Not on file   Number of children: 3   Years of education: Not on file   Highest education level: Not on file  Occupational History   Not on file  Tobacco Use   Smoking status: Former    Types: Cigarettes   Smokeless tobacco: Never   Tobacco comments:    pt states quit 1-2 years ago   Vaping Use   Vaping status: Never Used  Substance and Sexual Activity   Alcohol use: Yes    Comment: occassionally, not often   Drug use: Yes    Types: Cocaine, Marijuana    Comment: occasional cocaine use per availability; cannabis use 2x weekly   Sexual activity: Not Currently    Birth control/protection: None  Other Topics Concern   Not on file  Social History Narrative   ** Merged History Encounter **       Social Drivers of Health   Tobacco Use: Medium Risk (12/15/2024)   Patient History    Smoking Tobacco Use: Former    Smokeless Tobacco Use: Never    Passive Exposure: Not on Actuary Strain: High Risk (11/13/2022)   Overall Financial Resource Strain (CARDIA)    Difficulty of Paying Living Expenses: Very hard  Food Insecurity: Food Insecurity Present (12/15/2024)   Epic    Worried About Programme Researcher, Broadcasting/film/video in the Last Year: Sometimes true    Ran Out of Food in the Last Year: Sometimes true  Transportation Needs: Unmet Transportation Needs (12/15/2024)   Epic    Lack of Transportation (Medical): Yes    Lack of Transportation (Non-Medical): Yes  Physical Activity: Insufficiently Active (11/13/2022)   Exercise Vital Sign    Days of Exercise per Week: 4 days    Minutes of Exercise per Session: 30 min  Stress: Stress Concern Present (11/13/2022)   [employer] of Occupational Health - Occupational Stress Questionnaire    Feeling of Stress : Very much  Social Connections: Socially Isolated (11/13/2022)   Social Connection and Isolation Panel    Frequency of  Communication with Friends and Family: Once a week    Frequency of Social Gatherings with Friends and Family: Never    Attends Religious Services: Never    Database Administrator or Organizations: No    Attends Banker Meetings: Never    Marital Status: Separated  Depression (PHQ2-9): Medium Risk (09/10/2023)   Depression (PHQ2-9)    PHQ-2 Score: 9  Alcohol Screen: Medium Risk (12/15/2024)   Alcohol Screen    Last Alcohol Screening Score (AUDIT): 8  Housing: High Risk (12/15/2024)   Epic    Unable to Pay for Housing in the Last Year: Yes    Number of Times Moved in the Last Year: 3    Homeless in the Last Year:  Yes  Utilities: At Risk (12/15/2024)   Epic    Threatened with loss of utilities: Yes  Health Literacy: Not on file   Additional Social History:   Sleep: Good Estimated Sleeping Duration (Last 24 Hours): 7.25-7.75 hours  Appetite:  Good  Current Medications: Current Facility-Administered Medications  Medication Dose Route Frequency Provider Last Rate Last Admin   acetaminophen  (TYLENOL ) tablet 650 mg  650 mg Oral Q6H PRN Ramzan, Mariam, NP   650 mg at 12/18/24 2305   alum & mag hydroxide-simeth (MAALOX/MYLANTA) 200-200-20 MG/5ML suspension 30 mL  30 mL Oral Q4H PRN Ramzan, Mariam, NP       [START ON 12/20/2024] amLODipine  (NORVASC ) tablet 10 mg  10 mg Oral Daily Tanuj Mullens I, NP       haloperidol  (HALDOL ) tablet 5 mg  5 mg Oral TID PRN Ramzan, Mariam, NP       And   diphenhydrAMINE  (BENADRYL ) capsule 50 mg  50 mg Oral TID PRN Ramzan, Mariam, NP       haloperidol  lactate (HALDOL ) injection 5 mg  5 mg Intramuscular TID PRN Ramzan, Mariam, NP       And   diphenhydrAMINE  (BENADRYL ) injection 50 mg  50 mg Intramuscular TID PRN Ramzan, Mariam, NP       And   LORazepam  (ATIVAN ) injection 2 mg  2 mg Intramuscular TID PRN Ramzan, Mariam, NP       haloperidol  lactate (HALDOL ) injection 10 mg  10 mg Intramuscular TID PRN Ramzan, Mariam, NP       And   diphenhydrAMINE   (BENADRYL ) injection 50 mg  50 mg Intramuscular TID PRN Ramzan, Mariam, NP       And   LORazepam  (ATIVAN ) injection 2 mg  2 mg Intramuscular TID PRN Ramzan, Mariam, NP       gabapentin  (NEURONTIN ) capsule 200 mg  200 mg Oral TID Danielle Mink I, NP   200 mg at 12/19/24 1357   hydrOXYzine  (ATARAX ) tablet 25 mg  25 mg Oral TID PRN Ramzan, Mariam, NP   25 mg at 12/18/24 2305   magnesium  hydroxide (MILK OF MAGNESIA) suspension 30 mL  30 mL Oral Daily PRN Ramzan, Mariam, NP       sertraline  (ZOLOFT ) tablet 100 mg  100 mg Oral Daily Lee Kalt I, NP   100 mg at 12/19/24 9166   traZODone  (DESYREL ) tablet 150 mg  150 mg Oral QHS Collene Gouge I, NP   150 mg at 12/18/24 2124   Lab Results:  No results found for this or any previous visit (from the past 48 hours).  Blood Alcohol level:  Lab Results  Component Value Date   ETH 278 (H) 07/23/2024   ETH 49 (H) 09/12/2022   Metabolic Disorder Labs: Lab Results  Component Value Date   HGBA1C 5.1 09/12/2022   MPG 99.67 09/12/2022   MPG 99.67 05/29/2022   Lab Results  Component Value Date   PROLACTIN 5.6 05/29/2022   Lab Results  Component Value Date   CHOL 216 (H) 09/10/2023   TRIG 60 09/10/2023   HDL 104 09/10/2023   CHOLHDL 2.1 09/10/2023   VLDL 13 09/12/2022   LDLCALC 101 (H) 09/10/2023   LDLCALC NOT CALCULATED 09/12/2022   Physical Findings: AIMS:  ,  ,  ,  ,  ,  ,   CIWA:    COWS:     Musculoskeletal: Strength & Muscle Tone: within normal limits Gait & Station: normal Patient leans: N/A  Psychiatric Specialty Exam:  Presentation  General Appearance:  Casual; Fairly Groomed  Eye Contact: Good  Speech: Clear and Coherent; Normal Rate  Speech Volume: Normal  Handedness: Right   Mood and Affect  Mood: Depressed; Anxious  Affect: Congruent   Thought Process  Thought Processes: Coherent; Goal Directed; Linear  Descriptions of Associations:Intact  Orientation:Full (Time, Place and Person)  Thought  Content:Logical  History of Schizophrenia/Schizoaffective disorder:No  Duration of Psychotic Symptoms:N/A  Hallucinations:Hallucinations: None Description of Auditory Hallucinations: NA   Ideas of Reference:None  Suicidal Thoughts:Suicidal Thoughts: No   Homicidal Thoughts:Homicidal Thoughts: No    Sensorium  Memory: Immediate Good; Recent Good; Remote Good  Judgment: Fair  Insight: Fair   Art Therapist  Concentration: Good  Attention Span: Good  Recall: Good  Fund of Knowledge: Fair  Language: Good  Psychomotor Activity  Psychomotor Activity:Psychomotor Activity: Normal   Assets  Assets: Communication Skills; Desire for Improvement; Resilience; Social Support  Sleep  Sleep:Sleep: Good Number of Hours of Sleep: 8   Physical Exam: Physical Exam Vitals and nursing note reviewed.  HENT:     Nose: Nose normal.     Mouth/Throat:     Pharynx: Oropharynx is clear.  Cardiovascular:     Rate and Rhythm: Normal rate.     Pulses: Normal pulses.  Pulmonary:     Effort: Pulmonary effort is normal.  Genitourinary:    Comments: Deferred. Musculoskeletal:        General: Normal range of motion.     Cervical back: Normal range of motion.  Skin:    General: Skin is dry.  Neurological:     General: No focal deficit present.     Mental Status: She is alert and oriented to person, place, and time.    Review of Systems  Constitutional:  Negative for chills, diaphoresis and fever.  HENT:  Negative for congestion and sore throat.   Respiratory:  Negative for cough, shortness of breath and wheezing.   Cardiovascular:  Negative for chest pain and palpitations.  Gastrointestinal:  Negative for abdominal pain, constipation, diarrhea, heartburn, nausea and vomiting.  Genitourinary:  Negative for dysuria.  Musculoskeletal:  Negative for joint pain and myalgias.  Skin:  Negative for itching and rash.  Neurological:  Negative for dizziness, tingling,  tremors, sensory change, speech change, focal weakness, seizures, loss of consciousness, weakness and headaches.  Endo/Heme/Allergies:        Allergies: Peanuts, Latex.  Psychiatric/Behavioral:  Positive for depression and substance abuse. Negative for memory loss. The patient is nervous/anxious.    Blood pressure (!) 140/92, pulse 67, temperature 97.6 F (36.4 C), resp. rate 18, height 5' 7 (1.702 m), weight 76.2 kg, last menstrual period 04/12/2021, SpO2 100%. Body mass index is 26.31 kg/m.  Treatment Plan Summary: Daily contact with patient to assess and evaluate symptoms and progress in treatment and Medication management.   Principal/active diagnoses. Principal Problem: Major depressive disorder, recurrent episode, severe, with psychosis (HCC) Cocaine use disorder (HCC) Cannabis use disorder.    Associated symptoms.   Suicidal ideation  Plan: The risks/benefits/side-effects/alternatives to the medications in use were discussed in detail with the patient and time was given for patient's questions. The patient consents to medication trial.    -Continue Sertraline  100 mg po daily for depression.  -Continue Hydroxyzine  25 mg po tid prn for anxiety.  -Continue Trazodone  150 mg po Q hs (09:00 PM) for insomnia.  -Continue gabapentin  200 mg po tid for agitation/anxiety.  -Increased Norvasc  to 10 mg po daily for HTN.  Agitation:  -Continue as recommended (see Mar).   Other PRNS -Continue Tylenol  650 mg every 6 hours PRN for mild pain -Continue Maalox 30 ml Q 4 hrs PRN for indigestion -Continue MOM 30 ml po Q 6 hrs for constipation   Safety and Monitoring: Voluntary admission to inpatient psychiatric unit for safety, stabilization and treatment Daily contact with patient to assess and evaluate symptoms and progress in treatment Patient's case to be discussed in multi-disciplinary team meeting Observation Level : q15 minute checks Vital signs: q12 hours Precautions: Safety    Discharge Planning: Social work and case management to assist with discharge planning and identification of hospital follow-up needs prior to discharge Estimated LOS: 5-7 days Discharge Concerns: Need to establish a safety plan; Medication compliance and effectiveness Discharge Goals: Return home with outpatient referrals for mental health follow-up including medication management/psychotherapy  Mac Bolster, NP, pmhnp, fnp-bc. 12/19/2024, 4:44 PM Patient ID: [name], female   DOB: May 10, 1976, 49 y.o.   MRN: [id number] Patient ID: Lillyian Heidt, female   DOB: [birth year], 49 y.o.   MRN: [id number]

## 2024-12-20 MED ORDER — WHITE PETROLATUM EX OINT
TOPICAL_OINTMENT | CUTANEOUS | Status: AC
  Filled 2024-12-20: qty 5

## 2024-12-20 MED ORDER — HYDROXYZINE HCL 50 MG PO TABS
50.0000 mg | ORAL_TABLET | Freq: Three times a day (TID) | ORAL | Status: DC | PRN
  Administered 2024-12-20 – 2024-12-24 (×8): 50 mg via ORAL
  Filled 2024-12-20 (×3): qty 1
  Filled 2024-12-20: qty 10
  Filled 2024-12-20 (×5): qty 1

## 2024-12-20 MED ORDER — HYDROCORTISONE 1 % EX CREA
TOPICAL_CREAM | Freq: Two times a day (BID) | CUTANEOUS | Status: DC
  Filled 2024-12-20: qty 28

## 2024-12-20 NOTE — Progress Notes (Signed)
 Spiritual care group on grief and loss facilitated by Chaplain Rockie Sofia, [facility]  Group Goal: Support / Education around grief and loss  Members engage in facilitated group support and psycho-social education.  Group Description:  Following introductions and group rules, group members engaged in facilitated group dialogue and support around topic of loss, with particular support around experiences of loss in their lives. Group Identified types of loss (relationships / self / things) and identified patterns, circumstances, and changes that precipitate losses. Reflected on thoughts / feelings around loss, normalized grief responses, and recognized variety in grief experience. Group encouraged individual reflection on safe space and on the coping skills that they are already utilizing.  Group drew on Adlerian / Rogerian and narrative framework  Patient Progress: Anna [name] attended gorup and actively engaged and participated in group conversation and activities.

## 2024-12-20 NOTE — [facility] Group Notes (Signed)
 Adult Psychoeducational Group Note  Date:  12/20/2024 Time:  8:56 PM  Group Topic/Focus:  Wrap-Up Group:   The focus of this group is to help patients review their daily goal of treatment and discuss progress on daily workbooks.  Participation Level:  Active  Participation Quality:  Attentive  Affect:  Appropriate  Cognitive:  Alert  Insight: Appropriate  Engagement in Group:  Engaged  Modes of Intervention:  Discussion  Additional Comments:  Patient attended and participated in the Wrap-up group.  Anna [name] 12/20/2024, 8:56 PM

## 2024-12-20 NOTE — Progress Notes (Signed)
" °   12/20/24 1000  Psych Admission Type (Psych Patients Only)  Admission Status Voluntary  Psychosocial Assessment  Patient Complaints None  Eye Contact Fair  Facial Expression Flat  Affect Appropriate to circumstance  Speech Logical/coherent  Interaction Assertive  Motor Activity Other (Comment) (WDL)  Appearance/Hygiene Unremarkable  Behavior Characteristics Cooperative;Appropriate to situation  Mood Pleasant  Thought Process  Coherency WDL  Content WDL  Delusions None reported or observed  Perception WDL  Hallucination None reported or observed  Judgment Impaired  Confusion None  Danger to Self  Current suicidal ideation? Denies  Agreement Not to Harm Self Yes  Description of Agreement Verbal  Danger to Others  Danger to Others None reported or observed    "

## 2024-12-20 NOTE — [facility] Group Notes (Signed)
 Adult Psychoeducational Group Note  Date:  12/20/2024 Time:  3:28 PM  Group Topic/Focus: Physical Wellness  Participation Level:  Active  Participation Quality:  Appropriate and Attentive  Affect:  Appropriate  Cognitive:  Appropriate  Insight: Appropriate  Engagement in Group:  Engaged  Modes of Intervention:  Activity  Additional Comments:    Ronnell Puller 12/20/2024, 3:28 PM

## 2024-12-20 NOTE — Progress Notes (Signed)
 [facility] MD Progress Note  12/20/2024 9:39 AM [name]  MRN:  [id number]  Reason for  admission: 49 year old AA female with hx of major depressive disorder, anxiety disorder, cocaine use disorder & cannabis use disorder. She has been admitted & treated in this [facility] with complaint of homelessness, worsening suicidal ideations in 2023. At the time, after mood stabilization treatments, she was recommended to follow-up at the [facility]  for routine psychiatric care & medication management. Patient is being re-admitted to the [facility] this time with complaint of worsening suicidal ideations, auditory hallucinations & homelessness. A review of her current lab results has shown her UDS was positive for [facility] & cocaine. 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   Daily notes:  Chart reviewed. The chart findings discussed with the treatment team. [name] was seen in her room today, dressed in hospital scrubs. She describes her mood as mediocre and continues to report significant anxiety and depressive symptoms, rating her anxiety as 8/10 and depression as 7/10, largely related to unresolved psychosocial stressors and lack of stable housing after discharge. She denies suicidal or homicidal ideation, intent, or plan. She denies auditory hallucinations but endorses long-standing visual misperceptions at night, describing seeing a bogeyman since childhood, without evidence of acute psychosis or response to internal stimuli. She reports difficulty initiating sleep, though appetite is improving. She denies side effects from her psychiatric medications and appears to be tolerating recent increases in sertraline  and trazodone  without somatic complaints. She reports contacting [employer] without success and plans to call again today. She denies physical or cardiac complaints but reports a chronic pruritic rash involving the face and chest, with dermatology follow-up scheduled in February.   She remains medication  compliant, attends select groups, and interacts appropriately on the unit. Continued inpatient psychiatric hospitalization is recommended at this time due to persistent depressive and anxiety symptoms, sleep disturbance, limited coping skills, and substance use history, requiring ongoing medication adjustment, monitoring, and coordinated discharge planning. Case was reviewed with the attending psychiatrist. The plan is to continue the current treatment regimen, increase PRN hydroxyzine  to 50 mg three times daily for anxiety, and obtain labs including vitamin D  level, hemoglobin A1c, lipid panel, and TSH.    Principal Problem: Major depressive disorder, recurrent episode, severe, with psychosis (HCC)  Diagnosis: Principal Problem:   Major depressive disorder, recurrent episode, severe, with psychosis (HCC) Active Problems:   Cocaine use disorder (HCC)   Suicidal ideation   Cannabis use disorder  Total Time spent with patient: 45 minutes  Past Psychiatric History: MDD, cocaine use disorder, Cannabis use disorder.  Past Medical History:  Past Medical History:  Diagnosis Date   Anemia    Anxiety    Hypertension    only elevated at MD office per pt. no meds    Past Surgical History:  Procedure Laterality Date   ANKLE FRACTURE SURGERY Right    HYSTERECTOMY ABDOMINAL WITH SALPINGECTOMY Bilateral 05/22/2021   Procedure: HYSTERECTOMY ABDOMINAL WITH SALPINGECTOMY AND LEFT OOPHORECTOMY;  Surgeon: Lorence Ozell CROME, MD;  Location: [facility] OR;  Service: Gynecology;  Laterality: Bilateral;  TAP BLOCK   Family History:  Family History  Problem Relation Age of Onset   Hypertension Mother    Breast cancer Maternal Aunt 50   Heart failure Maternal Grandfather    Colon cancer Neg Hx    Colon polyps Neg Hx    Esophageal cancer Neg Hx    Rectal cancer Neg Hx    Stomach cancer Neg  Hx    Family Psychiatric  History:  See H&P.  Social History:  Social History   Substance and Sexual Activity   Alcohol Use Yes   Comment: occassionally, not often     Social History   Substance and Sexual Activity  Drug Use Yes   Types: Cocaine, Marijuana   Comment: occasional cocaine use per availability; cannabis use 2x weekly    Social History   Socioeconomic History   Marital status: Legally Separated    Spouse name: Not on file   Number of children: 3   Years of education: Not on file   Highest education level: Not on file  Occupational History   Not on file  Tobacco Use   Smoking status: Former    Types: Cigarettes   Smokeless tobacco: Never   Tobacco comments:    pt states quit 1-2 years ago   Vaping Use   Vaping status: Never Used  Substance and Sexual Activity   Alcohol use: Yes    Comment: occassionally, not often   Drug use: Yes    Types: Cocaine, Marijuana    Comment: occasional cocaine use per availability; cannabis use 2x weekly   Sexual activity: Not Currently    Birth control/protection: None  Other Topics Concern   Not on file  Social History Narrative   ** Merged History Encounter **       Social Drivers of Health   Tobacco Use: Medium Risk (12/15/2024)   Patient History    Smoking Tobacco Use: Former    Smokeless Tobacco Use: Never    Passive Exposure: Not on Actuary Strain: High Risk (11/13/2022)   Overall Financial Resource Strain (CARDIA)    Difficulty of Paying Living Expenses: Very hard  Food Insecurity: Food Insecurity Present (12/15/2024)   Epic    Worried About Programme Researcher, Broadcasting/film/video in the Last Year: Sometimes true    Ran Out of Food in the Last Year: Sometimes true  Transportation Needs: Unmet Transportation Needs (12/15/2024)   Epic    Lack of Transportation (Medical): Yes    Lack of Transportation (Non-Medical): Yes  Physical Activity: Insufficiently Active (11/13/2022)   Exercise Vital Sign    Days of Exercise per Week: 4 days    Minutes of Exercise per Session: 30 min  Stress: Stress Concern Present (11/13/2022)   [employer] - Occupational Stress Questionnaire    Feeling of Stress : Very much  Social Connections: Socially Isolated (11/13/2022)   Social Connection and Isolation Panel    Frequency of Communication with Friends and Family: Once a week    Frequency of Social Gatherings with Friends and Family: Never    Attends Religious Services: Never    Database Administrator or Organizations: No    Attends Banker Meetings: Never    Marital Status: Separated  Depression (PHQ2-9): Medium Risk (09/10/2023)   Depression (PHQ2-9)    PHQ-2 Score: 9  Alcohol Screen: Medium Risk (12/15/2024)   Alcohol Screen    Last Alcohol Screening Score (AUDIT): 8  Housing: High Risk (12/15/2024)   Epic    Unable to Pay for Housing in the Last Year: Yes    Number of Times Moved in the Last Year: 3    Homeless in the Last Year: Yes  Utilities: At Risk (12/15/2024)   Epic    Threatened with loss of utilities: Yes  Health Literacy: Not on file   Additional Social History:  Sleep: Good Estimated Sleeping Duration (Last 24 Hours): 8.00-8.50 hours  Appetite:  Good  Current Medications: Current Facility-Administered Medications  Medication Dose Route Frequency Provider Last Rate Last Admin   acetaminophen  (TYLENOL ) tablet 650 mg  650 mg Oral Q6H PRN Ramzan, Mariam, NP   650 mg at 12/18/24 2305   alum & mag hydroxide-simeth (MAALOX/MYLANTA) 200-200-20 MG/5ML suspension 30 mL  30 mL Oral Q4H PRN Ramzan, Mariam, NP       amLODipine  (NORVASC ) tablet 10 mg  10 mg Oral Daily Nwoko, Agnes I, NP       haloperidol  (HALDOL ) tablet 5 mg  5 mg Oral TID PRN Ramzan, Mariam, NP       And   diphenhydrAMINE  (BENADRYL ) capsule 50 mg  50 mg Oral TID PRN Ramzan, Mariam, NP       haloperidol  lactate (HALDOL ) injection 5 mg  5 mg Intramuscular TID PRN Ramzan, Mariam, NP       And   diphenhydrAMINE  (BENADRYL ) injection 50 mg  50 mg Intramuscular TID PRN Ramzan, Mariam, NP       And   LORazepam  (ATIVAN )  injection 2 mg  2 mg Intramuscular TID PRN Ramzan, Mariam, NP       haloperidol  lactate (HALDOL ) injection 10 mg  10 mg Intramuscular TID PRN Ramzan, Mariam, NP       And   diphenhydrAMINE  (BENADRYL ) injection 50 mg  50 mg Intramuscular TID PRN Ramzan, Mariam, NP       And   LORazepam  (ATIVAN ) injection 2 mg  2 mg Intramuscular TID PRN Ramzan, Mariam, NP       gabapentin  (NEURONTIN ) capsule 200 mg  200 mg Oral TID Nwoko, Agnes I, NP   200 mg at 12/19/24 2129   hydrOXYzine  (ATARAX ) tablet 25 mg  25 mg Oral TID PRN Ramzan, Mariam, NP   25 mg at 12/19/24 2129   magnesium  hydroxide (MILK OF MAGNESIA) suspension 30 mL  30 mL Oral Daily PRN Ramzan, Mariam, NP       sertraline  (ZOLOFT ) tablet 100 mg  100 mg Oral Daily Nwoko, Agnes I, NP   100 mg at 12/19/24 9166   traZODone  (DESYREL ) tablet 150 mg  150 mg Oral QHS Collene Gouge I, NP   150 mg at 12/19/24 2129   Lab Results:  No results found for this or any previous visit (from the past 48 hours).  Blood Alcohol level:  Lab Results  Component Value Date   ETH 278 (H) 07/23/2024   ETH 49 (H) 09/12/2022   Metabolic Disorder Labs: Lab Results  Component Value Date   HGBA1C 5.1 09/12/2022   MPG 99.67 09/12/2022   MPG 99.67 05/29/2022   Lab Results  Component Value Date   PROLACTIN 5.6 05/29/2022   Lab Results  Component Value Date   CHOL 216 (H) 09/10/2023   TRIG 60 09/10/2023   HDL 104 09/10/2023   CHOLHDL 2.1 09/10/2023   VLDL 13 09/12/2022   LDLCALC 101 (H) 09/10/2023   LDLCALC NOT CALCULATED 09/12/2022   Physical Findings: AIMS:  ,  ,  ,  ,  ,  ,   CIWA:    COWS:     Musculoskeletal: Strength & Muscle Tone: within normal limits Gait & Station: normal Patient leans: N/A  Psychiatric Specialty Exam:  Presentation  General Appearance:  Casual; Fairly Groomed  Eye Contact: Good  Speech: Clear and Coherent; Normal Rate  Speech Volume: Normal  Handedness: Right   Mood and Affect  Mood: Depressed;  Anxious  Affect: Congruent   Thought Process  Thought Processes: Coherent; Goal Directed; Linear  Descriptions of Associations:Intact  Orientation:Full (Time, Place and Person)  Thought Content:Logical  History of Schizophrenia/Schizoaffective disorder:No  Duration of Psychotic Symptoms:N/A  Hallucinations:Hallucinations: None Description of Auditory Hallucinations: NA   Ideas of Reference:None  Suicidal Thoughts:Suicidal Thoughts: No   Homicidal Thoughts:Homicidal Thoughts: No    Sensorium  Memory: Immediate Good; Recent Good; Remote Good  Judgment: Fair  Insight: Fair   Art Therapist  Concentration: Good  Attention Span: Good  Recall: Good  Fund of Knowledge: Fair  Language: Good  Psychomotor Activity  Psychomotor Activity:Psychomotor Activity: Normal   Assets  Assets: Communication Skills; Desire for Improvement; Resilience; Social Support  Sleep  Sleep:Sleep: Good Number of Hours of Sleep: 8   Physical Exam: Physical Exam Vitals and nursing note reviewed.  HENT:     Nose: Nose normal.     Mouth/Throat:     Pharynx: Oropharynx is clear.  Cardiovascular:     Rate and Rhythm: Normal rate.     Pulses: Normal pulses.  Pulmonary:     Effort: Pulmonary effort is normal.  Genitourinary:    Comments: Deferred. Musculoskeletal:        General: Normal range of motion.     Cervical back: Normal range of motion.  Skin:    General: Skin is dry.  Neurological:     General: No focal deficit present.     Mental Status: She is alert and oriented to person, place, and time.    Review of Systems  Constitutional:  Negative for chills, diaphoresis and fever.  HENT:  Negative for congestion and sore throat.   Respiratory:  Negative for cough, shortness of breath and wheezing.   Cardiovascular:  Negative for chest pain and palpitations.  Gastrointestinal:  Negative for abdominal pain, constipation, diarrhea, heartburn, nausea and  vomiting.  Genitourinary:  Negative for dysuria.  Musculoskeletal:  Negative for joint pain and myalgias.  Skin:  Negative for itching and rash.  Neurological:  Negative for dizziness, tingling, tremors, sensory change, speech change, focal weakness, seizures, loss of consciousness, weakness and headaches.  Endo/Heme/Allergies:        Allergies: Peanuts, Latex.  Psychiatric/Behavioral:  Positive for depression and substance abuse. Negative for memory loss. The patient is nervous/anxious.    Blood pressure (!) 130/90, pulse 64, temperature 97.9 F (36.6 C), temperature source Oral, resp. rate 16, height 5' 7 (1.702 m), weight 76.2 kg, last menstrual period 04/12/2021, SpO2 100%. Body mass index is 26.31 kg/m.  Treatment Plan Summary: Daily contact with patient to assess and evaluate symptoms and progress in treatment and Medication management.   Principal/active diagnoses. Principal Problem: Major depressive disorder, recurrent episode, severe, with psychosis (HCC) Cocaine use disorder (HCC) Cannabis use disorder.    Associated symptoms.   Suicidal ideation  Plan: The risks/benefits/side-effects/alternatives to the medications in use were discussed in detail with the patient and time was given for patient's questions. The patient consents to medication trial.    -Continue Sertraline  100 mg po daily for depression.  -Increase Hydroxyzine  to 50 mg po tid prn for anxiety.  -Continue Trazodone  150 mg po Q hs (09:00 PM) for insomnia.  -Continue gabapentin  200 mg po tid for agitation/anxiety.  -Continue Norvasc  to 10 mg po daily for HTN. -- Start hydrocortisone  cream 2 times daily facial/chest rash   Agitation:  -Continue as recommended (see Mar).      Safety and Monitoring: Voluntary admission to inpatient psychiatric unit  for safety, stabilization and treatment Daily contact with patient to assess and evaluate symptoms and progress in treatment Patient's case to be discussed in  multi-disciplinary team meeting Observation Level : q15 minute checks Vital signs: q12 hours Precautions: Safety   Discharge Planning: Social work and case management to assist with discharge planning and identification of hospital follow-up needs prior to discharge Estimated LOS: 5-7 days Discharge Concerns: Need to establish a safety plan; Medication compliance and effectiveness Discharge Goals: Return home with outpatient referrals for mental health follow-up including medication management/psychotherapy  Blair Chiquita Hint, NP 12/20/2024, 9:39 AM Patient ID: Anna [name], female   DOB: Mar 28, 1976, 49 y.o.   MRN: [id number] Patient ID: Winona Sison, female   DOB: 1975/12/11, 50 y.o.   MRN: [id number] Patient ID: Yatziry Deakins, female   DOB: 10-Sep-1976, 49 y.o.   MRN: [id number]

## 2024-12-20 NOTE — B[facility] Group Notes (Signed)
 Adult Psychoeducational Group Note  Date:  12/20/2024 Time:  5:32 PM  Group Topic/Focus: Occupational Therapy  Participation Level:  Active  Participation Quality:  Appropriate  Affect:  Appropriate  Cognitive:  Appropriate  Insight: Appropriate  Engagement in Group:  Engaged  Modes of Intervention:  Discussion  Additional Comments:    Ronnell Puller 12/20/2024, 5:32 PM

## 2024-12-20 NOTE — Plan of Care (Signed)
   Problem: Education: Goal: Knowledge of [facility] Education information/materials will improve Outcome: Progressing Goal: Emotional status will improve Outcome: Progressing Goal: Mental status will improve Outcome: Progressing Goal: Verbalization of understanding the information provided will improve Outcome: Progressing   Problem: Activity: Goal: Interest or engagement in activities will improve Outcome: Progressing

## 2024-12-20 NOTE — Group Note (Unsigned)
 Date:  12/20/2024 Time:  8:32 PM  Group Topic/Focus:  Wrap-Up Group:   The focus of this group is to help patients review their daily goal of treatment and discuss progress on daily workbooks.     Participation Level:  {BHH PARTICIPATION OZCZO:77735}  Participation Quality:  {BHH PARTICIPATION QUALITY:22265}  Affect:  {BHH AFFECT:22266}  Cognitive:  {BHH COGNITIVE:22267}  Insight: {BHH Insight2:20797}  Engagement in Group:  {BHH ENGAGEMENT IN HMNLE:77731}  Modes of Intervention:  {BHH MODES OF INTERVENTION:22269}  Additional Comments:  ***  Gwenn Nobie [place] 12/20/2024, 8:32 PM

## 2024-12-20 NOTE — Group Note (Signed)
 Recreation Therapy Group Note   Group Topic:Stress Management  Group Date: 12/20/2024 Start Time: 9056 End Time: 1058 Facilitators: Alvon Nygaard-McCall, LRT,CTRS Location: [address] Dayroom   Group Topic: Stress Management  Goal Area(s) Addresses:  Patient will identify positive stress management techniques. Patient will identify benefits of using stress management post d/c.  Behavioral Response:   Intervention: Let's Meditate App  Activity: LRT played a meditation called Turning the Page. The meditation encouraged patients to reflect on the past year, acknowledge their journey and everyday take each breath as a new step in towards their future goals.    Education:  Stress Management, Discharge Planning.   Education Outcome: Acknowledges Education   Affect/Mood: N/A   Participation Level: Did not attend    Clinical Observations/Individualized Feedback:      Plan: Continue to engage patient in RT group sessions 2-3x/week.   Randeep Biondolillo-McCall, LRT,CTRS 12/20/2024 12:37 PM

## 2024-12-21 LAB — LIPID PANEL
Cholesterol: 199 mg/dL (ref 0–200)
HDL: 105 mg/dL
LDL Cholesterol: 78 mg/dL (ref 0–99)
Total CHOL/HDL Ratio: 1.9 ratio
Triglycerides: 78 mg/dL
VLDL: 16 mg/dL (ref 0–40)

## 2024-12-21 LAB — TSH: TSH: 0.45 u[IU]/mL (ref 0.350–4.500)

## 2024-12-21 LAB — VITAMIN D 25 HYDROXY (VIT D DEFICIENCY, FRACTURES): Vit D, 25-Hydroxy: 10.1 ng/mL — ABNORMAL LOW (ref 30–100)

## 2024-12-21 LAB — HEMOGLOBIN A1C
Hgb A1c MFr Bld: 5.2 % (ref 4.8–5.6)
Mean Plasma Glucose: 102.54 mg/dL

## 2024-12-21 MED ORDER — VITAMIN D (ERGOCALCIFEROL) 1.25 MG (50000 UNIT) PO CAPS
50000.0000 [IU] | ORAL_CAPSULE | ORAL | Status: DC
  Administered 2024-12-21: 50000 [IU] via ORAL
  Filled 2024-12-21: qty 1

## 2024-12-21 MED ORDER — WHITE PETROLATUM EX OINT
TOPICAL_OINTMENT | CUTANEOUS | Status: AC
  Filled 2024-12-21: qty 5

## 2024-12-21 NOTE — [facility] Group Notes (Signed)
 Patient attended the Social Work group.

## 2024-12-21 NOTE — Plan of Care (Signed)

## 2024-12-21 NOTE — [facility] Group Notes (Signed)
 Patient did not attend [facility] group.

## 2024-12-21 NOTE — Group Note (Signed)
 Date:  12/21/2024 Time:  5:01 PM  Group Topic/Focus:  Dimensions of Wellness:   The focus of this group is to introduce the topic of wellness and discuss the role each dimension of wellness plays in total health.    Participation Level:  Active  Participation Quality:  Appropriate  Affect:  Appropriate  Cognitive:  Appropriate  Insight: Appropriate  Engagement in Group:  Engaged  Modes of Intervention:  Discussion  Annalee  Yaeko Fazekas 12/21/2024, 5:01 PM

## 2024-12-21 NOTE — Progress Notes (Signed)
" °   12/21/24 0900  Psych Admission Type (Psych Patients Only)  Admission Status Voluntary  Psychosocial Assessment  Patient Complaints Anxiety;Depression  Eye Contact Fair  Facial Expression Flat  Affect Appropriate to circumstance  Speech Logical/coherent  Interaction Assertive  Motor Activity Other (Comment) (WDL)  Appearance/Hygiene Unremarkable  Behavior Characteristics Cooperative;Appropriate to situation  Mood Pleasant  Thought Process  Coherency WDL  Content WDL  Delusions None reported or observed  Perception WDL  Hallucination None reported or observed  Judgment Impaired  Confusion None  Danger to Self  Current suicidal ideation? Denies  Agreement Not to Harm Self Yes  Description of Agreement Verbal  Danger to Others  Danger to Others None reported or observed    "

## 2024-12-21 NOTE — Group Note (Signed)
 Date:  12/21/2024 Time:  10:02 AM  Group Topic/Focus:  Goals Group:   The focus of this group is to help patients establish daily goals to achieve during treatment and discuss how the patient can incorporate goal setting into their daily lives to aide in recovery.    Participation Level:  Did Not Attend   Anna [name] 12/21/2024, 10:02 AM

## 2024-12-21 NOTE — Group Note (Signed)
 LCSW Group Therapy Note   Group Date: 12/21/2024 Start Time: 1100 End Time: 1200   Participation:  patient was present.  She listened and was respectful but didn't participate in the discussion.  Type of Therapy:  Group Therapy   Topic:  Understanding Your Path to Change.  Objective:  The goal is to help individuals understand the stages of change, identify where they currently are in the process, and provide actionable next steps to continue moving forward in their journey of change.  Goals: - Learn about the six stages of change:  Precontemplation, Contemplation, Preparation, Action, Maintenance, and Relapse - Reflect on Current Change Efforts:  Recognize which stage participants are in regarding a personal change. - Plan Next Steps for Moving Forward:  Create an action plan based on their current stage of change.  Class Summary:  In this session, we explored the Stages of Change as a framework to understand the process of change.  We discussed how each stage helps individuals recognize where they are in their personal journey and used the Stages of Change Worksheet for self-reflection. Participants answered questions to better understand their current stage, challenges, and progress. We also emphasized the importance of moving forward, even if setbacks (Relapse) occur, and created actionable steps to help participants continue progressing. By the end of the session, participants gained a clearer understanding of their path to change and left with a clear plan for next steps.  Therapeutic Modalities: Elements of CBT (cognitive restructuring, problem solving)  Element of DBT (mindfulness, distress tolerance)   Lyrik Dockstader O Magally Vahle, LCSWA 12/21/2024  12:58 PM

## 2024-12-21 NOTE — Group Note (Signed)
 Date:  12/21/2024 Time:  11:25 PM  Group Topic/Focus:  Wrap-Up Group:   The focus of this group is to help patients review their daily goal of treatment and discuss progress on daily workbooks.    Participation Level:  Active  Participation Quality:  Appropriate and Sharing  Affect:  Appropriate and Flat  Cognitive:  Appropriate  Insight: Appropriate  Engagement in Group:  Engaged  Modes of Intervention:  Activity and Socialization  Additional Comments:  Patients completed Wrap up group sheets. Patient shared that she is feeling, pretty good right now. Patient rated her day a 7. Patient high points from today, I spoke with my mom and low point, just a little anxiety.   Eward Mace 12/21/2024, 11:25 PM

## 2024-12-21 NOTE — Progress Notes (Cosign Needed Addendum)
 [facility] MD Progress Note  12/21/2024 10:35 PM Anna [name]  MRN:  [id number]  Reason for  admission: 49 year old AA female with hx of major depressive disorder, anxiety disorder, cocaine use disorder & cannabis use disorder. She has been admitted & treated in this [facility] with complaint of homelessness, worsening suicidal ideations in 2023. At the time, after mood stabilization treatments, she was recommended to follow-up at the [facility]  for routine psychiatric care & medication management. Patient is being re-admitted to the [facility] this time with complaint of worsening suicidal ideations, auditory hallucinations & homelessness. A review of her current lab results has shown her UDS was positive for [facility] & cocaine. 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   Today's Assessment Note:  Chart reviewed. The chart findings discussed with the treatment team. Today, Anna [name] reports her mood is building gradually but continues to experience insomnia with racing thoughts, having slept approximately 5.25 hours. Appetite is intact. She denies suicidal or homicidal ideation, intent, or plan. She endorses intermittent auditory hallucinations, describing vague voices that at times sound like her name being called, but denies visual hallucinations or paranoia. She does not appear overtly psychotic or internally preoccupied during the interview. She denies side effects from her current psychiatric medications and remains medication compliant. Housing insecurity remains a significant stressor; she reports being on the waitlist at [facility], plans to continue calling, and is requesting additional sober-living housing resources. She also reports her father in [place] may assist her after discharge. Review of recent labs shows TSH, hemoglobin A1c, and lipid panel within normal limits, with a significantly low vitamin D  level (10.1), for which supplementation will be initiated. She continues to attend select groups and  interact appropriately on the unit. Given ongoing depressive and anxiety symptoms, insomnia, intermittent auditory hallucinations, limited coping skills, and unresolved housing concerns, continued inpatient psychiatric hospitalization is recommended for further medication optimization, monitoring, and discharge planning.   Principal Problem: Major depressive disorder, recurrent episode, severe, with psychosis (HCC)  Diagnosis: Principal Problem:   Major depressive disorder, recurrent episode, severe, with psychosis (HCC) Active Problems:   Cocaine use disorder (HCC)   Suicidal ideation   Cannabis use disorder  Total Time spent with patient: 30 minutes  Past Psychiatric History: MDD, cocaine use disorder, Cannabis use disorder.  Past Medical History:  Past Medical History:  Diagnosis Date   Anemia    Anxiety    Hypertension    only elevated at MD office per pt. no meds    Past Surgical History:  Procedure Laterality Date   ANKLE FRACTURE SURGERY Right    HYSTERECTOMY ABDOMINAL WITH SALPINGECTOMY Bilateral 05/22/2021   Procedure: HYSTERECTOMY ABDOMINAL WITH SALPINGECTOMY AND LEFT OOPHORECTOMY;  Surgeon: Lorence Ozell CROME, MD;  Location: [facility] OR;  Service: Gynecology;  Laterality: Bilateral;  TAP BLOCK   Family History:  Family History  Problem Relation Age of Onset   Hypertension Mother    Breast cancer Maternal Aunt 50   Heart failure Maternal Grandfather    Colon cancer Neg Hx    Colon polyps Neg Hx    Esophageal cancer Neg Hx    Rectal cancer Neg Hx    Stomach cancer Neg Hx    Family Psychiatric  History:  See H&P.  Social History:  Social History   Substance and Sexual Activity  Alcohol Use Yes   Comment: occassionally, not often     Social History   Substance and Sexual Activity  Drug Use Yes  Types: Cocaine, Marijuana   Comment: occasional cocaine use per availability; cannabis use 2x weekly    Social History   Socioeconomic History   Marital status:  Legally Separated    Spouse name: Not on file   Number of children: 3   Years of education: Not on file   Highest education level: Not on file  Occupational History   Not on file  Tobacco Use   Smoking status: Former    Types: Cigarettes   Smokeless tobacco: Never   Tobacco comments:    pt states quit 1-2 years ago   Vaping Use   Vaping status: Never Used  Substance and Sexual Activity   Alcohol use: Yes    Comment: occassionally, not often   Drug use: Yes    Types: Cocaine, Marijuana    Comment: occasional cocaine use per availability; cannabis use 2x weekly   Sexual activity: Not Currently    Birth control/protection: None  Other Topics Concern   Not on file  Social History Narrative   ** Merged History Encounter **       Social Drivers of Health   Tobacco Use: Medium Risk (12/15/2024)   Patient History    Smoking Tobacco Use: Former    Smokeless Tobacco Use: Never    Passive Exposure: Not on Actuary Strain: High Risk (11/13/2022)   Overall Financial Resource Strain (CARDIA)    Difficulty of Paying Living Expenses: Very hard  Food Insecurity: Food Insecurity Present (12/15/2024)   Epic    Worried About Programme Researcher, Broadcasting/film/video in the Last Year: Sometimes true    Ran Out of Food in the Last Year: Sometimes true  Transportation Needs: Unmet Transportation Needs (12/15/2024)   Epic    Lack of Transportation (Medical): Yes    Lack of Transportation (Non-Medical): Yes  Physical Activity: Insufficiently Active (11/13/2022)   Exercise Vital Sign    Days of Exercise per Week: 4 days    Minutes of Exercise per Session: 30 min  Stress: Stress Concern Present (11/13/2022)   [employer] of Occupational Health - Occupational Stress Questionnaire    Feeling of Stress : Very much  Social Connections: Socially Isolated (11/13/2022)   Social Connection and Isolation Panel    Frequency of Communication with Friends and Family: Once a week    Frequency of Social  Gatherings with Friends and Family: Never    Attends Religious Services: Never    Database Administrator or Organizations: No    Attends Banker Meetings: Never    Marital Status: Separated  Depression (PHQ2-9): Medium Risk (09/10/2023)   Depression (PHQ2-9)    PHQ-2 Score: 9  Alcohol Screen: Medium Risk (12/15/2024)   Alcohol Screen    Last Alcohol Screening Score (AUDIT): 8  Housing: High Risk (12/15/2024)   Epic    Unable to Pay for Housing in the Last Year: Yes    Number of Times Moved in the Last Year: 3    Homeless in the Last Year: Yes  Utilities: At Risk (12/15/2024)   Epic    Threatened with loss of utilities: Yes  Health Literacy: Not on file   Additional Social History:   Sleep: Good Estimated Sleeping Duration (Last 24 Hours): 2.25-2.50 hours  Appetite:  Good  Current Medications: Current Facility-Administered Medications  Medication Dose Route Frequency Provider Last Rate Last Admin   acetaminophen  (TYLENOL ) tablet 650 mg  650 mg Oral Q6H PRN Ramzan, Mariam, NP   650 mg at  12/18/24 2305   alum & mag hydroxide-simeth (MAALOX/MYLANTA) 200-200-20 MG/5ML suspension 30 mL  30 mL Oral Q4H PRN Ramzan, Mariam, NP       amLODipine  (NORVASC ) tablet 10 mg  10 mg Oral Daily Nwoko, Agnes I, NP   10 mg at 12/21/24 9191   haloperidol  (HALDOL ) tablet 5 mg  5 mg Oral TID PRN Ramzan, Mariam, NP       And   diphenhydrAMINE  (BENADRYL ) capsule 50 mg  50 mg Oral TID PRN Ramzan, Mariam, NP       haloperidol  lactate (HALDOL ) injection 5 mg  5 mg Intramuscular TID PRN Ramzan, Mariam, NP       And   diphenhydrAMINE  (BENADRYL ) injection 50 mg  50 mg Intramuscular TID PRN Ramzan, Mariam, NP       And   LORazepam  (ATIVAN ) injection 2 mg  2 mg Intramuscular TID PRN Ramzan, Mariam, NP       haloperidol  lactate (HALDOL ) injection 10 mg  10 mg Intramuscular TID PRN Ramzan, Mariam, NP       And   diphenhydrAMINE  (BENADRYL ) injection 50 mg  50 mg Intramuscular TID PRN Ramzan, Mariam, NP        And   LORazepam  (ATIVAN ) injection 2 mg  2 mg Intramuscular TID PRN Ramzan, Mariam, NP       gabapentin  (NEURONTIN ) capsule 200 mg  200 mg Oral TID Nwoko, Agnes I, NP   200 mg at 12/21/24 2111   hydrocortisone  cream 1 %   Topical BID Boluwatife Flight H, NP   Given at 12/21/24 2111   hydrOXYzine  (ATARAX ) tablet 50 mg  50 mg Oral TID PRN Sindhu Nguyen H, NP   50 mg at 12/21/24 2111   magnesium  hydroxide (MILK OF MAGNESIA) suspension 30 mL  30 mL Oral Daily PRN Ramzan, Mariam, NP       sertraline  (ZOLOFT ) tablet 100 mg  100 mg Oral Daily Nwoko, Agnes I, NP   100 mg at 12/21/24 9191   traZODone  (DESYREL ) tablet 150 mg  150 mg Oral QHS Collene Gouge I, NP   150 mg at 12/21/24 2111   Vitamin D  (Ergocalciferol ) (DRISDOL ) 1.25 MG (50000 UNIT) capsule 50,000 Units  50,000 Units Oral Q7 days Blair Chiquita DEL, NP   50,000 Units at 12/21/24 1404   Lab Results:  Results for orders placed or performed during the hospital encounter of 12/15/24 (from the past 48 hours)  Hemoglobin A1c     Status: None   Collection Time: 12/21/24  6:39 AM  Result Value Ref Range   Hgb A1c MFr Bld 5.2 4.8 - 5.6 %    Comment: (NOTE) Diagnosis of Diabetes The following HbA1c ranges recommended by the American Diabetes Association (ADA) may be used as an aid in the diagnosis of diabetes mellitus.  Hemoglobin             Suggested A1C NGSP%              Diagnosis  <5.7                   Non Diabetic  5.7-6.4                Pre-Diabetic  >6.4                   Diabetic  <7.0                   Glycemic control for  adults with diabetes.     Mean Plasma Glucose 102.54 mg/dL    Comment: Performed at [facility], [address] [place], [place] [address]  VITAMIN D  25 Hydroxy (Vit-D Deficiency, Fractures)     Status: Abnormal   Collection Time: 12/21/24  6:39 AM  Result Value Ref Range   Vit D, 25-Hydroxy 10.1 (L) 30 - 100 ng/mL    Comment: (NOTE) Vitamin D  deficiency has been  defined by the Institute of Medicine  and an Endocrine Society practice guideline as a level of serum 25-OH  vitamin D  less than 20 ng/mL (1,2). The Endocrine Society went on to  further define vitamin D  insufficiency as a level between 21 and 29  ng/mL (2).  1. IOM (Institute of Medicine). 2010. Dietary reference intakes for  calcium and D. [place]: [employer]. 2. Holick MF, Binkley [place], Bischoff-Ferrari HA, et al. Evaluation,  treatment, and prevention of vitamin D  deficiency: an Endocrine  Society clinical practice guideline, JCEM. 2011 Jul; 96(7): 1911-30.  Performed at [facility], [address], [place], [place] [address]   Lipid panel     Status: None   Collection Time: 12/21/24  6:39 AM  Result Value Ref Range   Cholesterol 199 0 - 200 mg/dL    Comment:        ATP III CLASSIFICATION:  <200     mg/dL   Desirable  799-760  mg/dL   Borderline High  >=759    mg/dL   High           Triglycerides 78 <150 mg/dL   HDL 894 >59 mg/dL   Total CHOL/HDL Ratio 1.9 RATIO   VLDL 16 0 - 40 mg/dL   LDL Cholesterol 78 0 - 99 mg/dL    Comment:        Total Cholesterol/HDL:CHD Risk Coronary Heart Disease Risk Table                     Men   Women  1/2 Average Risk   3.4   3.3  Average Risk       5.0   4.4  2 X Average Risk   9.6   7.1  3 X Average Risk  23.4   11.0        Use the calculated Patient Ratio above and the CHD Risk Table to determine the patient's CHD Risk.        ATP III CLASSIFICATION (LDL):  <100     mg/dL   Optimal  899-870  mg/dL   Near or Above                    Optimal  130-159  mg/dL   Borderline  839-810  mg/dL   High  >809     mg/dL   Very High Performed at [facility], [address] [place], [place] [address]   TSH     Status: None   Collection Time: 12/21/24  6:39 AM  Result Value Ref Range   TSH 0.450 0.350 - 4.500 uIU/mL    Comment: Performed at [facility], [address], [place], [place] [address]    Blood Alcohol level:  Lab Results  Component Value Date   ETH 278 (H) 07/23/2024   ETH 49 (H) 09/12/2022   Metabolic Disorder Labs: Lab Results  Component Value Date   HGBA1C 5.2 12/21/2024   MPG 102.54  12/21/2024   MPG 99.67 09/12/2022   Lab Results  Component Value Date   PROLACTIN 5.6 05/29/2022   Lab Results  Component Value Date   CHOL 199 12/21/2024   TRIG 78 12/21/2024   HDL 105 12/21/2024   CHOLHDL 1.9 12/21/2024   VLDL 16 12/21/2024   LDLCALC 78 12/21/2024   LDLCALC 101 (H) 09/10/2023   Physical Findings: AIMS:  ,  ,  ,  ,  ,  ,   CIWA:    COWS:     Musculoskeletal: Strength & Muscle Tone: within normal limits Gait & Station: normal Patient leans: N/A  Psychiatric Specialty Exam:  Presentation  General Appearance:  Casual; Fairly Groomed  Eye Contact: Good  Speech: Clear and Coherent; Normal Rate  Speech Volume: Normal  Handedness: Right   Mood and Affect  Mood: Depressed; Anxious  Affect: Congruent   Thought Process  Thought Processes: Coherent; Goal Directed; Linear  Descriptions of Associations:Intact  Orientation:Full (Time, Place and Person)  Thought Content:Logical  History of Schizophrenia/Schizoaffective disorder:No  Duration of Psychotic Symptoms:N/A  Hallucinations:Hallucinations: Auditory Description of Auditory Hallucinations: Vague voices that at times sound like her name being called    Ideas of Reference:None  Suicidal Thoughts:Suicidal Thoughts: No SI Passive Intent and/or Plan: -- (Denies)    Homicidal Thoughts:Homicidal Thoughts: No     Sensorium  Memory: Immediate Good; Recent Good; Remote Good  Judgment: Fair  Insight: Fair   Art Therapist  Concentration: Good  Attention Span: Good  Recall: Good  Fund of Knowledge: Fair  Language: Good  Psychomotor Activity  Psychomotor Activity:No data recorded   Assets   Assets: Communication Skills; Desire for Improvement; Resilience; Social Support  Sleep  Sleep:Sleep: Fair    Physical Exam: Physical Exam Vitals and nursing note reviewed.  HENT:     Nose: Nose normal.     Mouth/Throat:     Pharynx: Oropharynx is clear.  Cardiovascular:     Rate and Rhythm: Normal rate.     Pulses: Normal pulses.  Pulmonary:     Effort: Pulmonary effort is normal.  Genitourinary:    Comments: Deferred. Musculoskeletal:        General: Normal range of motion.     Cervical back: Normal range of motion.  Skin:    General: Skin is dry.  Neurological:     General: No focal deficit present.     Mental Status: She is alert and oriented to person, place, and time.    Review of Systems  Constitutional:  Negative for chills, diaphoresis and fever.  HENT:  Negative for congestion and sore throat.   Respiratory:  Negative for cough, shortness of breath and wheezing.   Cardiovascular:  Negative for chest pain and palpitations.  Gastrointestinal:  Negative for abdominal pain, constipation, diarrhea, heartburn, nausea and vomiting.  Genitourinary:  Negative for dysuria.  Musculoskeletal:  Negative for joint pain and myalgias.  Skin:  Negative for itching and rash.  Neurological:  Negative for dizziness, tingling, tremors, sensory change, speech change, focal weakness, seizures, loss of consciousness, weakness and headaches.  Endo/Heme/Allergies:        Allergies: Peanuts, Latex.  Psychiatric/Behavioral:  Positive for depression and substance abuse. Negative for memory loss. The patient is nervous/anxious.    Blood pressure 111/80, pulse 73, temperature 97.7 F (36.5 C), temperature source Oral, resp. rate 16, height 5' 7 (1.702 m), weight 76.2 kg, last menstrual period 04/12/2021, SpO2 100%. Body mass index is 26.31 kg/m.  Treatment Plan Summary: Daily contact  with patient to assess and evaluate symptoms and progress in treatment and Medication management.    Principal/active diagnoses. Principal Problem: Major depressive disorder, recurrent episode, severe, with psychosis (HCC) Cocaine use disorder (HCC) Cannabis use disorder.    Associated symptoms.   Suicidal ideation  Plan: The risks/benefits/side-effects/alternatives to the medications in use were discussed in detail with the patient and time was given for patient's questions. The patient consents to medication trial.    -Continue Sertraline  100 mg po daily for depression.  -Continue Hydroxyzine  50 mg po tid prn for anxiety.  -Continue Trazodone  150 mg po Q hs (09:00 PM) for insomnia.  -Continue gabapentin  200 mg po tid for agitation/anxiety.  -Continue Norvasc  to 10 mg po daily for HTN. -Continue hydrocortisone  cream 2 times daily facial/chest rash -Start vitamin D  50,000 unit capsule, oral weekly for 6 doses   Agitation:  -Continue as recommended (see Mar).      Safety and Monitoring: Voluntary admission to inpatient psychiatric unit for safety, stabilization and treatment Daily contact with patient to assess and evaluate symptoms and progress in treatment Patient's case to be discussed in multi-disciplinary team meeting Observation Level : q15 minute checks Vital signs: q12 hours Precautions: Safety   Discharge Planning: Social work and case management to assist with discharge planning and identification of hospital follow-up needs prior to discharge Estimated LOS: 5-7 days Discharge Concerns: Need to establish a safety plan; Medication compliance and effectiveness Discharge Goals: Return home with outpatient referrals for mental health follow-up including medication management/psychotherapy  Blair Chiquita Hint, NP 12/21/2024, 10:35 PM Patient ID: Anna [name], female   DOB: 08/09/1976, 49 y.o.   MRN: [id number] Patient ID: Anna [name], female   DOB: [birth year], 49 y.o.   MRN: [id number] Patient ID: Anna [name], female   DOB: 05/08/1976, 49 y.o.   MRN:  [id number] Patient ID: Anna [name], female   DOB: 29-Jun-1976, 48 y.o.   MRN: [id number]

## 2024-12-21 NOTE — Progress Notes (Signed)
(  Sleep Hours) -5.25  (Any PRNs that were needed, meds refused, or side effects to meds)- hydroxyzine  50mg   (Any disturbances and when (visitation, over night)-none  (Concerns raised by the patient)- insomnia  (SI/HI/AVH)-denies

## 2024-12-21 NOTE — Plan of Care (Signed)
   Problem: Education: Goal: Knowledge of [facility] Education information/materials will improve Outcome: Progressing Goal: Emotional status will improve Outcome: Progressing Goal: Mental status will improve Outcome: Progressing

## 2024-12-21 NOTE — Group Note (Signed)
 Recreation Therapy Group Note   Group Topic:Animal Assisted Therapy   Group Date: 12/21/2024 Start Time: 9050 End Time: 1030 Facilitators: Tashayla Therien-McCall, LRT,CTRS Location: [address] Dayroom   Animal-Assisted Activity (AAA) Program Checklist/Progress Notes Patient Eligibility Criteria Checklist & Daily Group note for Rec Tx Intervention  AAA/T Program Assumption of Risk Form signed by Patient/ or Parent Legal Guardian Yes  Patient understands his/her participation is voluntary Yes  Behavioral Response:    Education: Charity Fundraiser, Appropriate Animal Interaction   Education Outcome: Acknowledges education.    Affect/Mood: N/A   Participation Level: Did not attend    Clinical Observations/Individualized Feedback:      Plan: Continue to engage patient in RT group sessions 2-3x/week.   Anna [name], LRT,CTRS  12/21/2024 12:06 PM

## 2024-12-21 NOTE — Progress Notes (Signed)
(  Sleep Hours) -7.5  (Any PRNs that were needed, meds refused, or side effects to meds)-hydroxyzine  50mg    (Any disturbances and when (visitation, over night)-none  (Concerns raised by the patient)- none  (SI/HI/AVH)-denies

## 2024-12-22 ENCOUNTER — Ambulatory Visit: Payer: MEDICAID | Admitting: Nurse Practitioner

## 2024-12-22 ENCOUNTER — Encounter (HOSPITAL_COMMUNITY): Payer: Self-pay

## 2024-12-22 DIAGNOSIS — F1490 Cocaine use, unspecified, uncomplicated: Secondary | ICD-10-CM | POA: Diagnosis not present

## 2024-12-22 DIAGNOSIS — R45851 Suicidal ideations: Secondary | ICD-10-CM | POA: Diagnosis not present

## 2024-12-22 DIAGNOSIS — F1290 Cannabis use, unspecified, uncomplicated: Secondary | ICD-10-CM | POA: Diagnosis not present

## 2024-12-22 DIAGNOSIS — F333 Major depressive disorder, recurrent, severe with psychotic symptoms: Secondary | ICD-10-CM | POA: Diagnosis not present

## 2024-12-22 MED ORDER — WHITE PETROLATUM EX OINT
TOPICAL_OINTMENT | CUTANEOUS | Status: AC
  Administered 2024-12-22: 1
  Filled 2024-12-22: qty 5

## 2024-12-22 NOTE — Group Note (Deleted)
 Date:  12/22/2024 Time:  8:41 AM  Group Topic/Focus:  Orientation:   The focus of this group is to educate the patient on the purpose and policies of crisis stabilization and provide a format to answer questions about their admission.  The group details unit policies and expectations of patients while admitted.     Participation Level:  {BHH PARTICIPATION OZCZO:77735}  Participation Quality:  {BHH PARTICIPATION QUALITY:22265}  Affect:  {BHH AFFECT:22266}  Cognitive:  {BHH COGNITIVE:22267}  Insight: {BHH Insight2:20797}  Engagement in Group:  {BHH ENGAGEMENT IN HMNLE:77731}  Modes of Intervention:  {BHH MODES OF INTERVENTION:22269}  Additional Comments:  ***  Kaylyn Garrow 12/22/2024, 8:41 AM

## 2024-12-22 NOTE — Plan of Care (Signed)
   Problem: Safety: Goal: Periods of time without injury will increase Outcome: Progressing

## 2024-12-22 NOTE — Group Note (Addendum)
 Date:  12/22/2024 Time:  9:30 AM  Group Topic/Focus:  Goals Group:   The focus of this group is to help patients establish daily goals to achieve during treatment and discuss how the patient can incorporate goal setting into their daily lives to aide in recovery. Orientation:   The focus of this group is to educate the patient on the purpose and policies of crisis stabilization and provide a format to answer questions about their admission.  The group details unit policies and expectations of patients while admitted.    Participation Level:  Did not attend    Anna [name] 12/22/2024, 9:30 AM

## 2024-12-22 NOTE — Group Note (Signed)
 Date:  12/22/2024 Time:  1:27 PM  Group Topic/Focus:  Spirituality:   The focus of this group is to discuss how one's spirituality can aide in recovery.    Participation Level:  Attended   Gulianna Hornsby 12/22/2024, 1:27 PM

## 2024-12-22 NOTE — Progress Notes (Signed)
 Spirituality group facilitated by Elia Rockie Sofia, BCC.  Group Description: Group focused on topic of community. Patients participated in facilitated discussion around topic, connecting with one another around experiences and definitions for community. Group members engaged with visual explorer photos, reflecting on what community looks like for them today. Group engaged in discussion around how their definitions of community are present today in hospital.  Modalities: Psycho-social ed, Adlerian, Narrative, MI  Patient Progress: Simrat attended group and actively engaged and participated in group conversation and activities.

## 2024-12-22 NOTE — Progress Notes (Signed)
 Anna [name]  Type of Note: Update on [employer]  CSW provided second packet and list of facilities that patient could go for sober living opportunities.  She was appreciative and CSW reported that she has to call and facilitate process of admission, interview with the facility, etc.  She understood what is needed and will call to begin looking for sober living and residential opportunities.  Signed: Obed Samek, LCSW 12/22/2024 8:57 AM

## 2024-12-22 NOTE — Progress Notes (Signed)
 Anna [name]  Type of Note: Verbal Auth for Release of Info  Pt stated that she would give this CSW permission to contact her mother, Sabrina Moats regarding possible discharge to her home pending admission to sober living residential facility in the near future.  Initially, she was reluctant because she did not want her mother to know her issues, homelessness, mental health issues, etc.  CSW provided CBT and talk therapy to process through some of her irrational fears about burdening my mother by being there [at her home].  Pt stated that her mother lives in a one-bedroom apartment however she has often 'crashed' on the couch there, but feels like this would burden her too much with her presence there.  However pt was able to see some of her hesitation was irrational and gave permission to have this CSW speak to her mother on her behalf about discharging to her place as an option to go into sober living residential treatment in the near future.  Signed: Pricilla Moehle, LCSW 12/22/2024 9:41 AM

## 2024-12-22 NOTE — Group Note (Signed)
 Recreation Therapy Group Note   Group Topic:Communication  Group Date: 12/22/2024 Start Time: 0940 End Time: 1005 Facilitators: Riki Gehring-McCall, LRT,CTRS Location: [address] Dayroom   Group Topic: Communication, Team Building, Problem Solving  Goal Area(s) Addresses:  Patient will effectively work with peer towards shared goal.  Patient will identify skills used to make activity successful.  Patient will identify how skills used during activity can be applied to reach post d/c goals.   Behavioral Response:   Intervention: STEM Activity- Glass Blower/designer  Activity: Tallest [employer]. In teams of 5-6, patients were given 11 craft pipe cleaners. Using the materials provided, patients were instructed to compete again the opposing team(s) to build the tallest free-standing structure from floor level. The activity was timed; difficulty increased by clinical research associate as production designer, theatre/television/film continued.  Systematically resources were removed with additional directions for example, placing one arm behind their back, working in silence, and shape stipulations. LRT facilitated post-activity discussion reviewing team processes and necessary communication skills involved in completion. Patients were encouraged to reflect how the skills utilized, or not utilized, in this activity can be incorporated to positively impact support systems post discharge.  Education: Pharmacist, Community, Scientist, Physiological, Discharge Planning   Education Outcome: Acknowledges education/In group clarification offered/Needs additional education.    Affect/Mood: N/A   Participation Level: Did not attend    Clinical Observations/Individualized Feedback:      Plan: Continue to engage patient in RT group sessions 2-3x/week.   Mahmoud Blazejewski-McCall, LRT,CTRS 12/22/2024 12:48 PM

## 2024-12-22 NOTE — [facility] Group Notes (Signed)
 [facility] Group Notes:  (Nursing/MHT/Case Management/Adjunct)  Date:  12/22/2024  Time:  9:27 PM  Type of Therapy:  NA Group  Participation Level:  Active  Participation Quality:  Appropriate  Affect:  Appropriate  Cognitive:  Appropriate  Insight:  Appropriate  Engagement in Group:  Engaged  Modes of Intervention:  Education  Summary of Progress/Problems:Attended NA meeting.  Anna [name] 12/22/2024, 9:27 PM

## 2024-12-22 NOTE — Group Note (Signed)
 Date:  12/22/2024 Time:  5:04 PM  Group Topic/Focus:  Managing Feelings:   The focus of this group is to identify what feelings patients have difficulty handling and develop a plan to handle them in a healthier way upon discharge. Overcoming Stress:   The focus of this group is to define stress and help patients assess their triggers.    Participation Level:  Active  Participation Quality:  Appropriate  Affect:  Appropriate  Cognitive:  Alert  Insight: Appropriate  Engagement in Group:  Engaged  Modes of Intervention:  Activity  Additional Comments:    Anna [name] Cardona 12/22/2024, 5:04 PM

## 2024-12-22 NOTE — Group Note (Deleted)
 Date:  12/22/2024 Time:  9:24 AM  Group Topic/Focus:  Goals Group:   The focus of this group is to help patients establish daily goals to achieve during treatment and discuss how the patient can incorporate goal setting into their daily lives to aide in recovery. Orientation:   The focus of this group is to educate the patient on the purpose and policies of crisis stabilization and provide a format to answer questions about their admission.  The group details unit policies and expectations of patients while admitted.     Participation Level:  {[facility] PARTICIPATION OZCZO:77735}  Participation Quality:  {[facility] PARTICIPATION QUALITY:22265}  Affect:  {[facility] AFFECT:22266}  Cognitive:  {[facility] COGNITIVE:22267}  Insight: {[facility] Insight2:20797}  Engagement in Group:  {[facility] ENGAGEMENT IN HMNLE:77731}  Modes of Intervention:  {[facility] MODES OF INTERVENTION:22269}  Additional Comments:  ***  Mazal Ebey 12/22/2024, 9:24 AM

## 2024-12-22 NOTE — BH IP Treatment Plan (Signed)
 Interdisciplinary Treatment and Diagnostic Plan Update  12/22/2024 Time of Session: THIS IS AN UPDATE Anna [name] MRN: [id number]  Principal Diagnosis: Major depressive disorder, recurrent episode, severe, with psychosis (HCC)  Secondary Diagnoses: Principal Problem:   Major depressive disorder, recurrent episode, severe, with psychosis (HCC) Active Problems:   Cocaine use disorder (HCC)   Suicidal ideation   Cannabis use disorder   Current Medications:  Current Facility-Administered Medications  Medication Dose Route Frequency Provider Last Rate Last Admin   acetaminophen  (TYLENOL ) tablet 650 mg  650 mg Oral Q6H PRN Ramzan, Mariam, NP   650 mg at 12/18/24 2305   alum & mag hydroxide-simeth (MAALOX/MYLANTA) 200-200-20 MG/5ML suspension 30 mL  30 mL Oral Q4H PRN Ramzan, Mariam, NP       amLODipine  (NORVASC ) tablet 10 mg  10 mg Oral Daily Nwoko, Agnes I, NP   10 mg at 12/22/24 9161   haloperidol  (HALDOL ) tablet 5 mg  5 mg Oral TID PRN Ramzan, Mariam, NP       And   diphenhydrAMINE  (BENADRYL ) capsule 50 mg  50 mg Oral TID PRN Ramzan, Mariam, NP       haloperidol  lactate (HALDOL ) injection 5 mg  5 mg Intramuscular TID PRN Ramzan, Mariam, NP       And   diphenhydrAMINE  (BENADRYL ) injection 50 mg  50 mg Intramuscular TID PRN Ramzan, Mariam, NP       And   LORazepam  (ATIVAN ) injection 2 mg  2 mg Intramuscular TID PRN Ramzan, Mariam, NP       haloperidol  lactate (HALDOL ) injection 10 mg  10 mg Intramuscular TID PRN Ramzan, Mariam, NP       And   diphenhydrAMINE  (BENADRYL ) injection 50 mg  50 mg Intramuscular TID PRN Ramzan, Mariam, NP       And   LORazepam  (ATIVAN ) injection 2 mg  2 mg Intramuscular TID PRN Ramzan, Mariam, NP       gabapentin  (NEURONTIN ) capsule 200 mg  200 mg Oral TID Nwoko, Agnes I, NP   200 mg at 12/22/24 9161   hydrocortisone  cream 1 %   Topical BID Bennett, Christal H, NP   Given at 12/22/24 9161   hydrOXYzine  (ATARAX ) tablet 50 mg  50 mg Oral TID PRN Bennett,  Christal H, NP   50 mg at 12/21/24 2111   magnesium  hydroxide (MILK OF MAGNESIA) suspension 30 mL  30 mL Oral Daily PRN Ramzan, Mariam, NP       sertraline  (ZOLOFT ) tablet 100 mg  100 mg Oral Daily Nwoko, Agnes I, NP   100 mg at 12/22/24 9161   traZODone  (DESYREL ) tablet 150 mg  150 mg Oral QHS Collene Gouge I, NP   150 mg at 12/21/24 2111   Vitamin D  (Ergocalciferol ) (DRISDOL ) 1.25 MG (50000 UNIT) capsule 50,000 Units  50,000 Units Oral Q7 days Blair, Christal H, NP   50,000 Units at 12/21/24 1404   white petrolatum  (VASELINE) gel            PTA Medications: Medications Prior to Admission  Medication Sig Dispense Refill Last Dose/Taking   amLODipine  (NORVASC ) 5 MG tablet Take 5 mg by mouth daily. (Patient not taking: Reported on 12/15/2024)      sertraline  (ZOLOFT ) 100 MG tablet Take 1 tablet (100 mg total) by mouth daily. (Patient not taking: Reported on 12/15/2024) 90 tablet 1    traZODone  (DESYREL ) 150 MG tablet Take 1 tablet (150 mg total) by mouth at bedtime as needed for sleep. (Patient not taking: Reported  on 12/15/2024) 90 tablet 1     Patient Stressors: Substance abuse    Patient Strengths: Motivation for treatment/growth   Treatment Modalities: Medication Management, Group therapy, Case management,  1 to 1 session with clinician, Psychoeducation, Recreational therapy.   Physician Treatment Plan for Primary Diagnosis: Major depressive disorder, recurrent episode, severe, with psychosis (HCC) Long Term Goal(s): Improvement in symptoms so as ready for discharge   Short Term Goals: Ability to identify and develop effective coping behaviors will improve Ability to maintain clinical measurements within normal limits will improve Compliance with prescribed medications will improve Ability to identify triggers associated with substance abuse/mental health issues will improve Ability to identify changes in lifestyle to reduce recurrence of condition will improve Ability to verbalize  feelings will improve Ability to disclose and discuss suicidal ideas Ability to demonstrate self-control will improve  Medication Management: Evaluate patient's response, side effects, and tolerance of medication regimen.  Therapeutic Interventions: 1 to 1 sessions, Unit Group sessions and Medication administration.  Evaluation of Outcomes: Progressing  Physician Treatment Plan for Secondary Diagnosis: Principal Problem:   Major depressive disorder, recurrent episode, severe, with psychosis (HCC) Active Problems:   Cocaine use disorder (HCC)   Suicidal ideation   Cannabis use disorder  Long Term Goal(s): Improvement in symptoms so as ready for discharge   Short Term Goals: Ability to identify and develop effective coping behaviors will improve Ability to maintain clinical measurements within normal limits will improve Compliance with prescribed medications will improve Ability to identify triggers associated with substance abuse/mental health issues will improve Ability to identify changes in lifestyle to reduce recurrence of condition will improve Ability to verbalize feelings will improve Ability to disclose and discuss suicidal ideas Ability to demonstrate self-control will improve     Medication Management: Evaluate patient's response, side effects, and tolerance of medication regimen.  Therapeutic Interventions: 1 to 1 sessions, Unit Group sessions and Medication administration.  Evaluation of Outcomes: Progressing   RN Treatment Plan for Primary Diagnosis: Major depressive disorder, recurrent episode, severe, with psychosis (HCC) Long Term Goal(s): Knowledge of disease and therapeutic regimen to maintain health will improve  Short Term Goals: Ability to remain free from injury will improve, Ability to verbalize frustration and anger appropriately will improve, Ability to demonstrate self-control, Ability to participate in decision making will improve, Ability to verbalize  feelings will improve, Ability to disclose and discuss suicidal ideas, Ability to identify and develop effective coping behaviors will improve, and Compliance with prescribed medications will improve  Medication Management: RN will administer medications as ordered by provider, will assess and evaluate patient's response and provide education to patient for prescribed medication. RN will report any adverse and/or side effects to prescribing provider.  Therapeutic Interventions: 1 on 1 counseling sessions, Psychoeducation, Medication administration, Evaluate responses to treatment, Monitor vital signs and CBGs as ordered, Perform/monitor CIWA, COWS, AIMS and Fall Risk screenings as ordered, Perform wound care treatments as ordered.  Evaluation of Outcomes: Progressing   LCSW Treatment Plan for Primary Diagnosis: Major depressive disorder, recurrent episode, severe, with psychosis (HCC) Long Term Goal(s): Safe transition to appropriate next level of care at discharge, Engage patient in therapeutic group addressing interpersonal concerns.  Short Term Goals: Engage patient in aftercare planning with referrals and resources, Increase social support, Increase ability to appropriately verbalize feelings, Increase emotional regulation, Facilitate acceptance of mental health diagnosis and concerns, Facilitate patient progression through stages of change regarding substance use diagnoses and concerns, Identify triggers associated with mental health/substance abuse issues,  and Increase skills for wellness and recovery  Therapeutic Interventions: Assess for all discharge needs, 1 to 1 time with Social worker, Explore available resources and support systems, Assess for adequacy in community support network, Educate family and significant other(s) on suicide prevention, Complete Psychosocial Assessment, Interpersonal group therapy.  Evaluation of Outcomes: Progressing   Progress in Treatment: Attending groups:  No. Participating in groups: No. Taking medication as prescribed: Yes. Toleration medication: Yes. Family/Significant other contact made: No, will contact:  patient declined to provide consents at this time Patient understands diagnosis: Yes. Discussing patient identified problems/goals with staff: Yes. Medical problems stabilized or resolved: Yes. Denies suicidal/homicidal ideation: Yes. Issues/concerns per patient self-inventory: No.   New problem(s) identified:  No   New Short Term/Long Term Goal(s):     medication stabilization, elimination of SI thoughts, development of comprehensive mental wellness plan.      Patient Goals:  I want to get my medications stabilized.   Discharge Plan or Barriers:  Patient recently admitted. CSW will continue to follow and assess for appropriate referrals and possible discharge planning.      Reason for Continuation of Hospitalization: Depression Medication stabilization Suicidal ideation   Estimated Length of Stay:  3 - 5 days  Last 3 [place] Suicide Severity Risk Score: Flowsheet Row Ad[facility] (Current) [facility] RISK CATEGORY Error: Q3, 4, or 5 should not be populated when Q2 is No Error: Q3, 4, or 5 should not be populated when Q2 is No Error: Question 6 not populated    Last PHQ 2/9 Scores:    09/10/2023    3:58 PM 02/11/2023   10:54 AM 11/13/2022   11:15 AM  Depression screen PHQ 2/9  Decreased Interest 2 1 2   Down, Depressed, Hopeless 2 2 3   PHQ - 2 Score 4 3 5   Altered sleeping 1 3 2   Tired, decreased energy 0 0 0  Change in appetite 0 0 0  Feeling bad or failure about yourself  2 2 3   Trouble concentrating 1 1 3   Moving slowly or fidgety/restless 0 1 2  Suicidal thoughts 1 1 2   PHQ-9 Score 9  11  17    Difficult doing work/chores   Somewhat  difficult     Data saved with a previous flowsheet row definition    Scribe for Treatment Team: Louetta Wynona SILK 12/22/2024 2:11 PM

## 2024-12-22 NOTE — Group Note (Signed)
 Date:  12/22/2024 Time:  10:04 AM  Group Topic/Focus:  Recreational Therapy    Participation Level:  Did Not Attend  Anna [name] 12/22/2024, 10:04 AM

## 2024-12-22 NOTE — Progress Notes (Signed)
 [place] MD Progress Note  12/22/2024 1:00 PM Anna [name]  MRN:  [id number]  Reason for  admission: 49 year old AA female with hx of major depressive disorder, anxiety disorder, cocaine use disorder & cannabis use disorder. She has been admitted & treated in this [facility] with complaint of homelessness, worsening suicidal ideations in 2023. At the time, after mood stabilization treatments, she was recommended to follow-up at the [facility]  for routine psychiatric care & medication management. Patient is being re-admitted to the [facility] this time with complaint of worsening suicidal ideations, auditory hallucinations & homelessness. A review of her current lab results has shown her UDS was positive for [facility] & cocaine. 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   Daily Note: Patient is seen in her room. Chart reviewed. The chart findings discussed with the treatment team during the team meeting this afternoon. She presents alert, oriented x 3 & aware of situation. She is visible on the unit, attending group sessions. She reports, My mood is improving gradually. I slept a lot better last night. I have been attending group sessions. Group sessions has been helpful. I'm learning coping skills, but I have to tell you, being on streets homeless can be so challenging that you tend to forget how to cope & fight for survival. That could mean doing what you would not ordinarily do if things were okay. The SW has given me some phone numbers for sober living houses within the city to call for accomodation. The [facility] in [place] did answer the phone, but they did not have a spot for me. They put me in their waiting list. I will start calling these sober living places sometime today. Patient currently denies any SIHI, AVH, delusional thoughts or paranoia. She does not be responding to any internal stimuli. There are no changes made on the current plan of care. Continue as already in progress. Vital signs remain  stable.  Principal Problem: Major depressive disorder, recurrent episode, severe, with psychosis (HCC)  Diagnosis: Principal Problem:   Major depressive disorder, recurrent episode, severe, with psychosis (HCC) Active Problems:   Cocaine use disorder (HCC)   Suicidal ideation   Cannabis use disorder  Total Time spent with patient: 30 minutes  Past Psychiatric History: MDD, cocaine use disorder, Cannabis use disorder.  Past Medical History:  Past Medical History:  Diagnosis Date   Anemia    Anxiety    Hypertension    only elevated at MD office per pt. no meds    Past Surgical History:  Procedure Laterality Date   ANKLE FRACTURE SURGERY Right    HYSTERECTOMY ABDOMINAL WITH SALPINGECTOMY Bilateral 05/22/2021   Procedure: HYSTERECTOMY ABDOMINAL WITH SALPINGECTOMY AND LEFT OOPHORECTOMY;  Surgeon: Lorence Ozell CROME, MD;  Location: [facility] OR;  Service: Gynecology;  Laterality: Bilateral;  TAP BLOCK   Family History:  Family History  Problem Relation Age of Onset   Hypertension Mother    Breast cancer Maternal Aunt 50   Heart failure Maternal Grandfather    Colon cancer Neg Hx    Colon polyps Neg Hx    Esophageal cancer Neg Hx    Rectal cancer Neg Hx    Stomach cancer Neg Hx    Family Psychiatric  History:  See H&P.  Social History:  Social History   Substance and Sexual Activity  Alcohol Use Yes   Comment: occassionally, not often     Social History   Substance and Sexual Activity  Drug Use Yes  Types: Cocaine, Marijuana   Comment: occasional cocaine use per availability; cannabis use 2x weekly    Social History   Socioeconomic History   Marital status: Legally Separated    Spouse name: Not on file   Number of children: 3   Years of education: Not on file   Highest education level: Not on file  Occupational History   Not on file  Tobacco Use   Smoking status: Former    Types: Cigarettes   Smokeless tobacco: Never   Tobacco comments:    pt states quit 1-2  years ago   Vaping Use   Vaping status: Never Used  Substance and Sexual Activity   Alcohol use: Yes    Comment: occassionally, not often   Drug use: Yes    Types: Cocaine, Marijuana    Comment: occasional cocaine use per availability; cannabis use 2x weekly   Sexual activity: Not Currently    Birth control/protection: None  Other Topics Concern   Not on file  Social History Narrative   ** Merged History Encounter **       Social Drivers of Health   Tobacco Use: Medium Risk (12/15/2024)   Patient History    Smoking Tobacco Use: Former    Smokeless Tobacco Use: Never    Passive Exposure: Not on Actuary Strain: High Risk (11/13/2022)   Overall Financial Resource Strain (CARDIA)    Difficulty of Paying Living Expenses: Very hard  Food Insecurity: Food Insecurity Present (12/15/2024)   Epic    Worried About Programme Researcher, Broadcasting/film/video in the Last Year: Sometimes true    Ran Out of Food in the Last Year: Sometimes true  Transportation Needs: Unmet Transportation Needs (12/15/2024)   Epic    Lack of Transportation (Medical): Yes    Lack of Transportation (Non-Medical): Yes  Physical Activity: Insufficiently Active (11/13/2022)   Exercise Vital Sign    Days of Exercise per Week: 4 days    Minutes of Exercise per Session: 30 min  Stress: Stress Concern Present (11/13/2022)   [employer] of Occupational Health - Occupational Stress Questionnaire    Feeling of Stress : Very much  Social Connections: Socially Isolated (11/13/2022)   Social Connection and Isolation Panel    Frequency of Communication with Friends and Family: Once a week    Frequency of Social Gatherings with Friends and Family: Never    Attends Religious Services: Never    Database Administrator or Organizations: No    Attends Banker Meetings: Never    Marital Status: Separated  Depression (PHQ2-9): Medium Risk (09/10/2023)   Depression (PHQ2-9)    PHQ-2 Score: 9  Alcohol Screen: Medium Risk  (12/15/2024)   Alcohol Screen    Last Alcohol Screening Score (AUDIT): 8  Housing: High Risk (12/15/2024)   Epic    Unable to Pay for Housing in the Last Year: Yes    Number of Times Moved in the Last Year: 3    Homeless in the Last Year: Yes  Utilities: At Risk (12/15/2024)   Epic    Threatened with loss of utilities: Yes  Health Literacy: Not on file   Additional Social History:   Sleep: Good Estimated Sleeping Duration (Last 24 Hours): 6.75-7.25 hours  Appetite:  Good  Current Medications: Current Facility-Administered Medications  Medication Dose Route Frequency Provider Last Rate Last Admin   acetaminophen  (TYLENOL ) tablet 650 mg  650 mg Oral Q6H PRN Ramzan, Mariam, NP   650 mg at  12/18/24 2305   alum & mag hydroxide-simeth (MAALOX/MYLANTA) 200-200-20 MG/5ML suspension 30 mL  30 mL Oral Q4H PRN Ramzan, Mariam, NP       amLODipine  (NORVASC ) tablet 10 mg  10 mg Oral Daily Santana Gosdin I, NP   10 mg at 12/22/24 9161   haloperidol  (HALDOL ) tablet 5 mg  5 mg Oral TID PRN Ramzan, Mariam, NP       And   diphenhydrAMINE  (BENADRYL ) capsule 50 mg  50 mg Oral TID PRN Ramzan, Mariam, NP       haloperidol  lactate (HALDOL ) injection 5 mg  5 mg Intramuscular TID PRN Ramzan, Mariam, NP       And   diphenhydrAMINE  (BENADRYL ) injection 50 mg  50 mg Intramuscular TID PRN Ramzan, Mariam, NP       And   LORazepam  (ATIVAN ) injection 2 mg  2 mg Intramuscular TID PRN Ramzan, Mariam, NP       haloperidol  lactate (HALDOL ) injection 10 mg  10 mg Intramuscular TID PRN Ramzan, Mariam, NP       And   diphenhydrAMINE  (BENADRYL ) injection 50 mg  50 mg Intramuscular TID PRN Ramzan, Mariam, NP       And   LORazepam  (ATIVAN ) injection 2 mg  2 mg Intramuscular TID PRN Ramzan, Mariam, NP       gabapentin  (NEURONTIN ) capsule 200 mg  200 mg Oral TID Timberlee Roblero I, NP   200 mg at 12/22/24 9161   hydrocortisone  cream 1 %   Topical BID Bennett, Christal H, NP   Given at 12/22/24 9161   hydrOXYzine  (ATARAX ) tablet 50  mg  50 mg Oral TID PRN Bennett, Christal H, NP   50 mg at 12/21/24 2111   magnesium  hydroxide (MILK OF MAGNESIA) suspension 30 mL  30 mL Oral Daily PRN Ramzan, Mariam, NP       sertraline  (ZOLOFT ) tablet 100 mg  100 mg Oral Daily Jasie Meleski I, NP   100 mg at 12/22/24 9161   traZODone  (DESYREL ) tablet 150 mg  150 mg Oral QHS Collene Gouge I, NP   150 mg at 12/21/24 2111   Vitamin D  (Ergocalciferol ) (DRISDOL ) 1.25 MG (50000 UNIT) capsule 50,000 Units  50,000 Units Oral Q7 days Bennett, Christal H, NP   50,000 Units at 12/21/24 1404   white petrolatum  (VASELINE) gel            Lab Results:  Results for orders placed or performed during the hospital encounter of 12/15/24 (from the past 48 hours)  Hemoglobin A1c     Status: None   Collection Time: 12/21/24  6:39 AM  Result Value Ref Range   Hgb A1c MFr Bld 5.2 4.8 - 5.6 %    Comment: (NOTE) Diagnosis of Diabetes The following HbA1c ranges recommended by the American Diabetes Association (ADA) may be used as an aid in the diagnosis of diabetes mellitus.  Hemoglobin             Suggested A1C NGSP%              Diagnosis  <5.7                   Non Diabetic  5.7-6.4                Pre-Diabetic  >6.4                   Diabetic  <7.0  Glycemic control for                       adults with diabetes.     Mean Plasma Glucose 102.54 mg/dL    Comment: Performed at [facility], [address] [place], [place] [address]  VITAMIN D  25 Hydroxy (Vit-D Deficiency, Fractures)     Status: Abnormal   Collection Time: 12/21/24  6:39 AM  Result Value Ref Range   Vit D, 25-Hydroxy 10.1 (L) 30 - 100 ng/mL    Comment: (NOTE) Vitamin D  deficiency has been defined by the Institute of Medicine  and an Endocrine Society practice guideline as a level of serum 25-OH  vitamin D  less than 20 ng/mL (1,2). The Endocrine Society went on to  further define vitamin D  insufficiency as a level between 21 and 29  ng/mL (2).  1. IOM  (Institute of Medicine). 2010. Dietary reference intakes for  calcium and D. [place]: [employer]. 2. Holick MF, Binkley Kenefic, Bischoff-Ferrari HA, et al. Evaluation,  treatment, and prevention of vitamin D  deficiency: an Endocrine  Society clinical practice guideline, JCEM. 2011 Jul; 96(7): 1911-30.  Performed at [facility], [address], [place], [place] [address]   Lipid panel     Status: None   Collection Time: 12/21/24  6:39 AM  Result Value Ref Range   Cholesterol 199 0 - 200 mg/dL    Comment:        ATP III CLASSIFICATION:  <200     mg/dL   Desirable  799-760  mg/dL   Borderline High  >=759    mg/dL   High           Triglycerides 78 <150 mg/dL   HDL 894 >59 mg/dL   Total CHOL/HDL Ratio 1.9 RATIO   VLDL 16 0 - 40 mg/dL   LDL Cholesterol 78 0 - 99 mg/dL    Comment:        Total Cholesterol/HDL:CHD Risk Coronary Heart Disease Risk Table                     Men   Women  1/2 Average Risk   3.4   3.3  Average Risk       5.0   4.4  2 X Average Risk   9.6   7.1  3 X Average Risk  23.4   11.0        Use the calculated Patient Ratio above and the CHD Risk Table to determine the patient's CHD Risk.        ATP III CLASSIFICATION (LDL):  <100     mg/dL   Optimal  899-870  mg/dL   Near or Above                    Optimal  130-159  mg/dL   Borderline  839-810  mg/dL   High  >809     mg/dL   Very High Performed at [facility], [address], [place], [place] [address]   TSH     Status: None   Collection Time: 12/21/24  6:39 AM  Result Value Ref Range   TSH 0.450 0.350 - 4.500 uIU/mL    Comment: Performed at [facility], [address] [place], [place] [address]    Blood Alcohol level:  Lab Results  Component Value Date   ETH 278 (H) 07/23/2024  ETH 49 (H) 09/12/2022   Metabolic Disorder Labs: Lab Results  Component Value Date   HGBA1C 5.2 12/21/2024   MPG 102.54 12/21/2024   MPG 99.67  09/12/2022   Lab Results  Component Value Date   PROLACTIN 5.6 05/29/2022   Lab Results  Component Value Date   CHOL 199 12/21/2024   TRIG 78 12/21/2024   HDL 105 12/21/2024   CHOLHDL 1.9 12/21/2024   VLDL 16 12/21/2024   LDLCALC 78 12/21/2024   LDLCALC 101 (H) 09/10/2023   Physical Findings: AIMS:  ,  ,  ,  ,  ,  ,   CIWA:    COWS:     Musculoskeletal: Strength & Muscle Tone: within normal limits Gait & Station: normal Patient leans: N/A  Psychiatric Specialty Exam:  Presentation  General Appearance:  Casual; Fairly Groomed  Eye Contact: Good  Speech: Clear and Coherent; Normal Rate  Speech Volume: Normal  Handedness: Right  Mood and Affect  Mood: Depressed; Anxious  Affect: Congruent  Thought Process  Thought Processes: Coherent; Goal Directed; Linear  Descriptions of Associations:Intact  Orientation:Full (Time, Place and Person)  Thought Content:Logical  History of Schizophrenia/Schizoaffective disorder:No  Duration of Psychotic Symptoms:N/A  Hallucinations:Hallucinations: Auditory Description of Auditory Hallucinations: Vague voices that at times sound like her name being called  Ideas of Reference:None  Suicidal Thoughts:Suicidal Thoughts: No SI Passive Intent and/or Plan: -- (Denies)  Homicidal Thoughts:Homicidal Thoughts: No  Sensorium  Memory: Immediate Good; Recent Good; Remote Good  Judgment: Fair  Insight: Fair  Art Therapist  Concentration: Good  Attention Span: Good  Recall: Good  Fund of Knowledge: Fair  Language: Good  Psychomotor Activity  Psychomotor Activity:No data recorded  Assets  Assets: Communication Skills; Desire for Improvement; Resilience; Social Support  Sleep  Sleep:Sleep: Fair  Physical Exam: Physical Exam Vitals and nursing note reviewed.  HENT:     Nose: Nose normal.     Mouth/Throat:     Pharynx: Oropharynx is clear.  Cardiovascular:     Rate and Rhythm: Normal  rate.     Pulses: Normal pulses.  Pulmonary:     Effort: Pulmonary effort is normal.  Genitourinary:    Comments: Deferred. Musculoskeletal:        General: Normal range of motion.     Cervical back: Normal range of motion.  Skin:    General: Skin is dry.  Neurological:     General: No focal deficit present.     Mental Status: She is alert and oriented to person, place, and time.    Review of Systems  Constitutional:  Negative for chills, diaphoresis and fever.  HENT:  Negative for congestion and sore throat.   Respiratory:  Negative for cough, shortness of breath and wheezing.   Cardiovascular:  Negative for chest pain and palpitations.  Gastrointestinal:  Negative for abdominal pain, constipation, diarrhea, heartburn, nausea and vomiting.  Genitourinary:  Negative for dysuria.  Musculoskeletal:  Negative for joint pain and myalgias.  Skin:  Negative for itching and rash.  Neurological:  Negative for dizziness, tingling, tremors, sensory change, speech change, focal weakness, seizures, loss of consciousness, weakness and headaches.  Endo/Heme/Allergies:        Allergies: Peanuts, Latex.  Psychiatric/Behavioral:  Positive for depression and substance abuse. Negative for memory loss. The patient is nervous/anxious.    Blood pressure 113/81, pulse 70, temperature 97.7 F (36.5 C), temperature source Oral, resp. rate 16, height 5' 7 (1.702 m), weight 76.2 kg, last menstrual period 04/12/2021, SpO2 100%.  Body mass index is 26.31 kg/m.  Treatment Plan Summary: Daily contact with patient to assess and evaluate symptoms and progress in treatment and Medication management.   Principal/active diagnoses. Principal Problem: Major depressive disorder, recurrent episode, severe, with psychosis (HCC) Cocaine use disorder (HCC) Cannabis use disorder.    Associated symptoms.   Suicidal ideation  Plan: The risks/benefits/side-effects/alternatives to the medications in use were discussed  in detail with the patient and time was given for patient's questions. The patient consents to medication trial.    -Continue Sertraline  100 mg po daily for depression.  -Continue Hydroxyzine  50 mg po tid prn for anxiety.  -Continue Trazodone  150 mg po Q hs (09:00 PM) for insomnia.  -Continue gabapentin  200 mg po tid for agitation/anxiety.  -Continue Norvasc  to 10 mg po daily for HTN. -Continue hydrocortisone  cream 2 times daily facial/chest rash -Continue vitamin D  50,000 unit capsule, oral weekly for 6 doses   Agitation:  -Continue as recommended (see Mar).   Safety and Monitoring: Voluntary admission to inpatient psychiatric unit for safety, stabilization and treatment Daily contact with patient to assess and evaluate symptoms and progress in treatment Patient's case to be discussed in multi-disciplinary team meeting Observation Level : q15 minute checks Vital signs: q12 hours Precautions: Safety   Discharge Planning: Social work and case management to assist with discharge planning and identification of hospital follow-up needs prior to discharge Estimated LOS: 5-7 days Discharge Concerns: Need to establish a safety plan; Medication compliance and effectiveness Discharge Goals: Return home with outpatient referrals for mental health follow-up including medication management/psychotherapy  Mac Bolster, NP 12/22/2024, 1:00 PM Patient ID: Anna [name], female   DOB: February 03, 1976, 49 y.o.   MRN: [id number] Patient ID: Anna [name], female   DOB: 07/28/1976, 49 y.o.   MRN: [id number] Patient ID: Anna [name], female   DOB: 06-04-1976, 49 y.o.   MRN: [id number] Patient ID: Anna [name], female   DOB: 01-09-1976, 49 y.o.   MRN: [id number] Patient ID: Anna [name], female   DOB: [birth year], 49 y.o.   MRN: [id number]

## 2024-12-22 NOTE — Group Note (Deleted)
 Date:  12/22/2024 Time:  8:32 AM  Group Topic/Focus:  Goals Group:   The focus of this group is to help patients establish daily goals to achieve during treatment and discuss how the patient can incorporate goal setting into their daily lives to aide in recovery. Orientation:   The focus of this group is to educate the patient on the purpose and policies of crisis stabilization and provide a format to answer questions about their admission.  The group details unit policies and expectations of patients while admitted.     Participation Level:  {BHH PARTICIPATION OZCZO:77735}  Participation Quality:  {BHH PARTICIPATION QUALITY:22265}  Affect:  {BHH AFFECT:22266}  Cognitive:  {BHH COGNITIVE:22267}  Insight: {BHH Insight2:20797}  Engagement in Group:  {BHH ENGAGEMENT IN HMNLE:77731}  Modes of Intervention:  {BHH MODES OF INTERVENTION:22269}  Additional Comments:  ***  Zyanya Glaza 12/22/2024, 8:32 AM

## 2024-12-22 NOTE — Progress Notes (Signed)
" °   12/22/24 1800  Psych Admission Type (Psych Patients Only)  Admission Status Voluntary  Psychosocial Assessment  Patient Complaints Anxiety  Eye Contact Fair  Facial Expression Flat  Affect Appropriate to circumstance  Speech Logical/coherent  Interaction Assertive  Motor Activity Other (Comment)  Appearance/Hygiene Unremarkable  Behavior Characteristics Cooperative  Mood Pleasant  Thought Process  Coherency WDL  Content WDL  Delusions None reported or observed  Perception WDL  Hallucination None reported or observed  Judgment Impaired  Confusion None  Danger to Self  Current suicidal ideation? Denies  Danger to Others  Danger to Others None reported or observed    "

## 2024-12-22 NOTE — Plan of Care (Signed)

## 2024-12-22 NOTE — Progress Notes (Signed)
(  Sleep Hours) - 5.75 (Any PRNs that were needed, meds refused, or side effects to meds)- PRN Hydroxyzine  (Any disturbances and when (visitation, over night)- None (Concerns raised by the patient)- None (SI/HI/AVH)- Denies. Contracts for safety.

## 2024-12-23 DIAGNOSIS — F1290 Cannabis use, unspecified, uncomplicated: Secondary | ICD-10-CM | POA: Diagnosis not present

## 2024-12-23 DIAGNOSIS — R45851 Suicidal ideations: Secondary | ICD-10-CM | POA: Diagnosis not present

## 2024-12-23 DIAGNOSIS — F333 Major depressive disorder, recurrent, severe with psychotic symptoms: Secondary | ICD-10-CM | POA: Diagnosis not present

## 2024-12-23 DIAGNOSIS — F1490 Cocaine use, unspecified, uncomplicated: Secondary | ICD-10-CM | POA: Diagnosis not present

## 2024-12-23 NOTE — Group Note (Signed)
 Date:  12/23/2024 Time:  10:09 AM  Group Topic/Focus: Nutrient Group  For adult patients, mental health is strongly supported by adequate intake of key nutrient groups that maintain brain function and emotional regulation. Complex carbohydrates provide a steady source of energy and support serotonin production, while proteins supply essential amino acids needed for neurotransmitters such as dopamine and serotonin. Healthy fats, particularly omega-3 fatty acids, are vital for brain cell membrane integrity and cognitive function. Micronutrients including B-complex vitamins, vitamin D , magnesium , zinc, and iron play critical roles in mood regulation, stress response, and neural signaling, with deficiencies often linked to depression and anxiety. Antioxidants from fruits and vegetables help protect the brain from oxidative stress, and dietary fiber along with probiotics supports the gut-brain axis, which influences mood and behavior. Adequate hydration further supports concentration, energy levels, and emotional stability.    Participation Level:  Did Not Attend   Dolores CHRISTELLA Fredericks 12/23/2024, 10:09 AM

## 2024-12-23 NOTE — Plan of Care (Signed)

## 2024-12-23 NOTE — Group Note (Signed)
 Date:  12/23/2024 Time:  2:26 PM  Group Topic/Focus: Social Work  Anger is a normal and natural emotion, but when it is not understood or managed well, it can negatively affect our mental health, relationships, and daily functioning. In this group, we explore how anger often serves as a signal that something feels unfair, threatening, or overwhelming. By learning to recognize early warning signs--such as physical tension, racing thoughts, or irritability--patients can begin to pause before reacting. Anger management skills like deep breathing, grounding techniques, healthy communication, and problem-solving help individuals express their feelings in safer and more constructive ways. Developing these skills empowers patients to gain better emotional control, reduce conflict, and improve overall well-being.    Participation Level:  Did Not Attend   Dolores CHRISTELLA Fredericks 12/23/2024, 2:26 PM

## 2024-12-23 NOTE — Group Note (Signed)
 LCSW Group Therapy Note   Group Date: 12/23/2024 Start Time: 1100 End Time: 1200   Participation:  did not attend  Type of Therapy:  Group Therapy  Topic:  Healing Flames: Navigating Anger with Compassion  Objective:  Foster self-awareness and promote compassion toward oneself and others when dealing with anger.  Goals:  Help participants understand the underlying emotions and needs fueling anger. Provide coping strategies for healthier emotional expression and anger management.  Summary: This session explored anger as a volcano--an explosion driven by deeper feelings and unmet needs. Participants learned to identify anger triggers and underlying emotions, then practiced coping strategies like deep breathing, physical activity, and journaling. The group discussed healthy ways to manage anger before it escalates, using both personal reflection and shared experiences.  Therapeutic Modalities: Cognitive Behavioral Therapy (CBT): Challenging thoughts that fuel anger. Mindfulness: Increasing awareness of emotions and sensations.   Tinlee Navarrette O Dorianna Mckiver, LCSWA 12/23/2024  2:14 PM

## 2024-12-23 NOTE — [facility] Suicide Risk Assessment (Signed)
 [facility] INPATIENT:  Family/Significant Other Suicide Prevention Education  Suicide Prevention Education:  Education Completed; Juanita Davis (mom),  (name of family member/significant other) has been identified by the patient as the family member/significant other with whom the patient will be residing, and identified as the person(s) who will aid the patient in the event of a mental health crisis (suicidal ideations/suicide attempt).  With written consent from the patient, the family member/significant other has been provided the following suicide prevention education, prior to the and/or following the discharge of the patient.  The suicide prevention education provided includes the following: Suicide risk factors Suicide prevention and interventions National Suicide Hotline telephone number [facility] assessment telephone number [facility] Emergency Assistance 911 [facility] and/or Residential Mobile Crisis Unit telephone number  Request made of family/significant other to: Remove weapons (e.g., guns, rifles, knives), all items previously/currently identified as safety concern.   Remove drugs/medications (over-the-counter, prescriptions, illicit drugs), all items previously/currently identified as a safety concern.  The family member/significant other verbalizes understanding of the suicide prevention education information provided.  The family member/significant other agrees to remove the items of safety concern listed above.  Derick JONELLE Blanch, LCSW 12/23/2024, 3:57 PM

## 2024-12-23 NOTE — Group Note (Signed)
 Date:  12/23/2024 Time:  9:45 AM  Group Topic/Focus: Goal and orientation  Goals Group:   The focus of this group is to help patients establish daily goals to achieve during treatment and discuss how the patient can incorporate goal setting into their daily lives to aide in recovery. Orientation:   The focus of this group is to educate the patient on the purpose and policies of crisis stabilization and provide a format to answer questions about their admission.  The group details unit policies and expectations of patients while admitted.    Participation Level:  Did Not Attend   Anna [name] 12/23/2024, 9:45 AM

## 2024-12-23 NOTE — Plan of Care (Signed)
   Problem: Education: Goal: Knowledge of [facility] Education information/materials will improve Outcome: Progressing   Problem: Activity: Goal: Interest or engagement in activities will improve Outcome: Progressing   Problem: Coping: Goal: Ability to verbalize frustrations and anger appropriately will improve Outcome: Progressing   Problem: Safety: Goal: Periods of time without injury will increase Outcome: Progressing

## 2024-12-23 NOTE — Progress Notes (Signed)
 Anna [name]  Type of Note: Updates  CSW spoke to Anna [name] (mom) to discuss safe discharge to her home for some time until she can be admitted to a substance use treatment facility for residential care.  [name] was agreeable to allow pt to return home for a short amount of time until she can be admitted to a facility for treatment.    CSW also faxed a referral to [facility] for this pt; discussed H&P with lady on phone at [facility]; she requested relevant notes to be faxed; that has been completed.  CSW will f/u with [facility] later in the day to see if they will accept this pt for sub use treatment.  Signed: Alilah Mcmeans, LCSW 12/23/2024 10:31 AM

## 2024-12-23 NOTE — [facility] Group Notes (Signed)
 [facility] Group Notes:  (Nursing/MHT/Case Management/Adjunct)  Date:  12/23/2024  Time:  9:20 PM  Type of Therapy:  Wrap up group  Participation Level:  Active  Participation Quality:  Appropriate  Affect:  Appropriate  Cognitive:  Appropriate  Insight:  Appropriate  Engagement in Group:  Engaged  Modes of Intervention:  Education  Summary of Progress/Problems:Goal to see another day. Rated day 6/10.  Grayce LITTIE Essex 12/23/2024, 9:20 PM

## 2024-12-23 NOTE — Progress Notes (Signed)
 [facility] MD Progress Note  12/23/2024 1:33 PM Anna [name]  MRN:  [id number]  Reason for  admission: 49 year old AA female with hx of major depressive disorder, anxiety disorder, cocaine use disorder & cannabis use disorder. She has been admitted & treated in this [facility] with complaint of homelessness, worsening suicidal ideations in 2023. At the time, after mood stabilization treatments, she was recommended to follow-up at the [facility]  for routine psychiatric care & medication management. Patient is being re-admitted to the [facility] this time with complaint of worsening suicidal ideations, auditory hallucinations & homelessness. A review of her current lab results has shown her UDS was positive for [facility] & cocaine. 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   Daily Note: [name] is seen this morning in her room. Chart reviewed. The chart findings discussed with the treatment team during the team meeting this afternoon. She presents alert, oriented & aware of situation. She is making a good eye contact & verbally responsive. She is visible on unit, attending group sessions. She reports, I'm doing okay. I slept well last night. The social worker just informed me this morning that he called my mother & talked to her. So, he informed me that my mother has agreed for me to come & stay with her after I get discharged from here. That is good to know. I feel good about it. So, how long do I have to stay now? I'm doing well on the medicines. I have no side effects to report. [name] currently denies any SIHI, AVH, delusional thoughts or paranoia. Patient is informed that she is likely to be discharged tomorrow morning. There are no changes made on her current plan of care. Continue as already in progress. Vital signs remain stable.  Principal Problem: Major depressive disorder, recurrent episode, severe, with psychosis (HCC)  Diagnosis: Principal Problem:   Major depressive disorder, recurrent episode, severe, with  psychosis (HCC) Active Problems:   Cocaine use disorder (HCC)   Suicidal ideation   Cannabis use disorder  Total Time spent with patient: 30 minutes  Past Psychiatric History: MDD, cocaine use disorder, Cannabis use disorder.  Past Medical History:  Past Medical History:  Diagnosis Date   Anemia    Anxiety    Hypertension    only elevated at MD office per pt. no meds    Past Surgical History:  Procedure Laterality Date   ANKLE FRACTURE SURGERY Right    HYSTERECTOMY ABDOMINAL WITH SALPINGECTOMY Bilateral 05/22/2021   Procedure: HYSTERECTOMY ABDOMINAL WITH SALPINGECTOMY AND LEFT OOPHORECTOMY;  Surgeon: Lorence Ozell CROME, MD;  Location: [facility] OR;  Service: Gynecology;  Laterality: Bilateral;  TAP BLOCK   Family History:  Family History  Problem Relation Age of Onset   Hypertension Mother    Breast cancer Maternal Aunt 50   Heart failure Maternal Grandfather    Colon cancer Neg Hx    Colon polyps Neg Hx    Esophageal cancer Neg Hx    Rectal cancer Neg Hx    Stomach cancer Neg Hx    Family Psychiatric  History:  See H&P.  Social History:  Social History   Substance and Sexual Activity  Alcohol Use Yes   Comment: occassionally, not often     Social History   Substance and Sexual Activity  Drug Use Yes   Types: Cocaine, Marijuana   Comment: occasional cocaine use per availability; cannabis use 2x weekly    Social History   Socioeconomic History   Marital status:  Legally Separated    Spouse name: Not on file   Number of children: 3   Years of education: Not on file   Highest education level: Not on file  Occupational History   Not on file  Tobacco Use   Smoking status: Former    Types: Cigarettes   Smokeless tobacco: Never   Tobacco comments:    pt states quit 1-2 years ago   Vaping Use   Vaping status: Never Used  Substance and Sexual Activity   Alcohol use: Yes    Comment: occassionally, not often   Drug use: Yes    Types: Cocaine, Marijuana     Comment: occasional cocaine use per availability; cannabis use 2x weekly   Sexual activity: Not Currently    Birth control/protection: None  Other Topics Concern   Not on file  Social History Narrative   ** Merged History Encounter **       Social Drivers of Health   Tobacco Use: Medium Risk (12/15/2024)   Patient History    Smoking Tobacco Use: Former    Smokeless Tobacco Use: Never    Passive Exposure: Not on Actuary Strain: High Risk (11/13/2022)   Overall Financial Resource Strain (CARDIA)    Difficulty of Paying Living Expenses: Very hard  Food Insecurity: Food Insecurity Present (12/15/2024)   Epic    Worried About Programme Researcher, Broadcasting/film/video in the Last Year: Sometimes true    Ran Out of Food in the Last Year: Sometimes true  Transportation Needs: Unmet Transportation Needs (12/15/2024)   Epic    Lack of Transportation (Medical): Yes    Lack of Transportation (Non-Medical): Yes  Physical Activity: Insufficiently Active (11/13/2022)   Exercise Vital Sign    Days of Exercise per Week: 4 days    Minutes of Exercise per Session: 30 min  Stress: Stress Concern Present (11/13/2022)   [employer] of Occupational Health - Occupational Stress Questionnaire    Feeling of Stress : Very much  Social Connections: Socially Isolated (11/13/2022)   Social Connection and Isolation Panel    Frequency of Communication with Friends and Family: Once a week    Frequency of Social Gatherings with Friends and Family: Never    Attends Religious Services: Never    Database Administrator or Organizations: No    Attends Banker Meetings: Never    Marital Status: Separated  Depression (PHQ2-9): Medium Risk (09/10/2023)   Depression (PHQ2-9)    PHQ-2 Score: 9  Alcohol Screen: Medium Risk (12/15/2024)   Alcohol Screen    Last Alcohol Screening Score (AUDIT): 8  Housing: High Risk (12/15/2024)   Epic    Unable to Pay for Housing in the Last Year: Yes    Number of Times Moved in  the Last Year: 3    Homeless in the Last Year: Yes  Utilities: At Risk (12/15/2024)   Epic    Threatened with loss of utilities: Yes  Health Literacy: Not on file   Additional Social History:   Sleep: Good Estimated Sleeping Duration (Last 24 Hours): 5.75-6.00 hours  Appetite:  Good  Current Medications: Current Facility-Administered Medications  Medication Dose Route Frequency Provider Last Rate Last Admin   acetaminophen  (TYLENOL ) tablet 650 mg  650 mg Oral Q6H PRN Ramzan, Mariam, NP   650 mg at 12/18/24 2305   alum & mag hydroxide-simeth (MAALOX/MYLANTA) 200-200-20 MG/5ML suspension 30 mL  30 mL Oral Q4H PRN Ramzan, Mariam, NP  amLODipine  (NORVASC ) tablet 10 mg  10 mg Oral Daily Johntavius Shepard I, NP   10 mg at 12/23/24 9148   haloperidol  (HALDOL ) tablet 5 mg  5 mg Oral TID PRN Ramzan, Mariam, NP       And   diphenhydrAMINE  (BENADRYL ) capsule 50 mg  50 mg Oral TID PRN Ramzan, Mariam, NP       haloperidol  lactate (HALDOL ) injection 5 mg  5 mg Intramuscular TID PRN Ramzan, Mariam, NP       And   diphenhydrAMINE  (BENADRYL ) injection 50 mg  50 mg Intramuscular TID PRN Ramzan, Mariam, NP       And   LORazepam  (ATIVAN ) injection 2 mg  2 mg Intramuscular TID PRN Ramzan, Mariam, NP       haloperidol  lactate (HALDOL ) injection 10 mg  10 mg Intramuscular TID PRN Ramzan, Mariam, NP       And   diphenhydrAMINE  (BENADRYL ) injection 50 mg  50 mg Intramuscular TID PRN Ramzan, Mariam, NP       And   LORazepam  (ATIVAN ) injection 2 mg  2 mg Intramuscular TID PRN Ramzan, Mariam, NP       gabapentin  (NEURONTIN ) capsule 200 mg  200 mg Oral TID Shakura Cowing I, NP   200 mg at 12/23/24 0851   hydrocortisone  cream 1 %   Topical BID Bennett, Christal H, NP   Given at 12/23/24 9147   hydrOXYzine  (ATARAX ) tablet 50 mg  50 mg Oral TID PRN Bennett, Christal H, NP   50 mg at 12/23/24 9095   magnesium  hydroxide (MILK OF MAGNESIA) suspension 30 mL  30 mL Oral Daily PRN Ramzan, Mariam, NP       sertraline   (ZOLOFT ) tablet 100 mg  100 mg Oral Daily Consuella Scurlock I, NP   100 mg at 12/23/24 0851   traZODone  (DESYREL ) tablet 150 mg  150 mg Oral QHS Jashua Knaak, Mac I, NP   150 mg at 12/22/24 2115   Vitamin D  (Ergocalciferol ) (DRISDOL ) 1.25 MG (50000 UNIT) capsule 50,000 Units  50,000 Units Oral Q7 days Blair, Christal H, NP   50,000 Units at 12/21/24 1404   Lab Results:  No results found for this or any previous visit (from the past 48 hours).  Blood Alcohol level:  Lab Results  Component Value Date   ETH 278 (H) 07/23/2024   ETH 49 (H) 09/12/2022   Metabolic Disorder Labs: Lab Results  Component Value Date   HGBA1C 5.2 12/21/2024   MPG 102.54 12/21/2024   MPG 99.67 09/12/2022   Lab Results  Component Value Date   PROLACTIN 5.6 05/29/2022   Lab Results  Component Value Date   CHOL 199 12/21/2024   TRIG 78 12/21/2024   HDL 105 12/21/2024   CHOLHDL 1.9 12/21/2024   VLDL 16 12/21/2024   LDLCALC 78 12/21/2024   LDLCALC 101 (H) 09/10/2023   Physical Findings: AIMS:  ,  ,  ,  ,  ,  ,   CIWA:    COWS:     Musculoskeletal: Strength & Muscle Tone: within normal limits Gait & Station: normal Patient leans: N/A  Psychiatric Specialty Exam:  Presentation  General Appearance:  Casual; Fairly Groomed  Eye Contact: Good  Speech: Clear and Coherent; Normal Rate  Speech Volume: Normal  Handedness: Right  Mood and Affect  Mood: -- (Improving.)  Affect: Congruent  Thought Process  Thought Processes: Coherent; Goal Directed; Linear  Descriptions of Associations:Intact  Orientation:Full (Time, Place and Person)  Thought Content:Logical  History  of Schizophrenia/Schizoaffective disorder:No  Duration of Psychotic Symptoms:N/A  Hallucinations:Hallucinations: None Description of Auditory Hallucinations: NA   Ideas of Reference:None  Suicidal Thoughts:Suicidal Thoughts: No   Homicidal Thoughts:Homicidal Thoughts: No   Sensorium  Memory: Immediate Good;  Recent Good; Remote Good  Judgment: Fair  Insight: Fair  Art Therapist  Concentration: Good  Attention Span: Good  Recall: Good  Fund of Knowledge: Fair  Language: Good  Psychomotor Activity  Psychomotor Activity:Psychomotor Activity: Normal  Assets  Assets: Communication Skills; Desire for Improvement; Housing; Resilience; Physical Health; Social Support  Sleep  Sleep:Sleep: Good Number of Hours of Sleep: 7.5  Physical Exam: Physical Exam Vitals and nursing note reviewed.  HENT:     Nose: Nose normal.     Mouth/Throat:     Pharynx: Oropharynx is clear.  Cardiovascular:     Rate and Rhythm: Normal rate.     Pulses: Normal pulses.  Pulmonary:     Effort: Pulmonary effort is normal.  Genitourinary:    Comments: Deferred. Musculoskeletal:        General: Normal range of motion.     Cervical back: Normal range of motion.  Skin:    General: Skin is dry.  Neurological:     General: No focal deficit present.     Mental Status: She is alert and oriented to person, place, and time.    Review of Systems  Constitutional:  Negative for chills, diaphoresis and fever.  HENT:  Negative for congestion and sore throat.   Respiratory:  Negative for cough, shortness of breath and wheezing.   Cardiovascular:  Negative for chest pain and palpitations.  Gastrointestinal:  Negative for abdominal pain, constipation, diarrhea, heartburn, nausea and vomiting.  Genitourinary:  Negative for dysuria.  Musculoskeletal:  Negative for joint pain and myalgias.  Skin:  Negative for itching and rash.  Neurological:  Negative for dizziness, tingling, tremors, sensory change, speech change, focal weakness, seizures, loss of consciousness, weakness and headaches.  Endo/Heme/Allergies:        Allergies: Peanuts, Latex.  Psychiatric/Behavioral:  Positive for depression and substance abuse. Negative for memory loss. The patient is nervous/anxious.    Blood pressure 120/78, pulse  71, temperature 97.7 F (36.5 C), temperature source Oral, resp. rate 16, height 5' 7 (1.702 m), weight 76.2 kg, last menstrual period 04/12/2021, SpO2 100%. Body mass index is 26.31 kg/m.  Treatment Plan Summary: Daily contact with patient to assess and evaluate symptoms and progress in treatment and Medication management.   Principal/active diagnoses. Principal Problem: Major depressive disorder, recurrent episode, severe, with psychosis (HCC) Cocaine use disorder (HCC) Cannabis use disorder.    Associated symptoms.   Suicidal ideation  Plan: The risks/benefits/side-effects/alternatives to the medications in use were discussed in detail with the patient and time was given for patient's questions. The patient consents to medication trial.    -Continue Sertraline  100 mg po daily for depression.  -Continue Hydroxyzine  50 mg po tid prn for anxiety.  -Continue Trazodone  150 mg po Q hs (09:00 PM) for insomnia.  -Continue gabapentin  200 mg po tid for agitation/anxiety.  -Continue Norvasc  to 10 mg po daily for HTN. -Continue hydrocortisone  cream 2 times daily facial/chest rash -Continue vitamin D  50,000 unit capsule, oral weekly for 6 doses   Agitation:  -Continue as recommended (see Mar).   Safety and Monitoring: Voluntary admission to inpatient psychiatric unit for safety, stabilization and treatment Daily contact with patient to assess and evaluate symptoms and progress in treatment Patient's case to be discussed in multi-disciplinary  team meeting Observation Level : q15 minute checks Vital signs: q12 hours Precautions: Safety   Discharge Planning: Social work and case management to assist with discharge planning and identification of hospital follow-up needs prior to discharge Estimated LOS: 5-7 days Discharge Concerns: Need to establish a safety plan; Medication compliance and effectiveness Discharge Goals: Return home with outpatient referrals for mental health follow-up  including medication management/psychotherapy  Mac Bolster, NP 12/23/2024, 1:33 PM Patient ID: [name], female   DOB: [birth year], 49 y.o.   MRN: [id number] Patient ID: Ramla Hase, female   DOB: 01-27-1976, 50 y.o.   MRN: [id number] Patient ID: Lilliahna Schubring, female   DOB: 1976/02/23, 49 y.o.   MRN: [id number] Patient ID: Fatuma Dowers, female   DOB: [birth year], 49 y.o.   MRN: [id number] Patient ID: Afreen Siebels, female   DOB: [birth year], 49 y.o.   MRN: [id number] Patient ID: Brenleigh Collet, female   DOB: 24-Oct-1976, 49 y.o.   MRN: [id number]

## 2024-12-23 NOTE — Group Note (Signed)
 Date:  12/23/2024 Time:  4:33 PM  Group Topic/Focus:Occupational therapy  Grounding techniques in occupational therapy help adult mental health patients manage stress, anxiety, and emotional distress by bringing attention to the present moment. These techniques use sensory input, physical movement, breathing, or cognitive focus to support emotional regulation and improve participation in daily activities. By integrating grounding strategies into meaningful occupations and routines, occupational therapists promote coping skills, independence, and overall mental well-being.     Participation Level:  Active  Anna [name] 12/23/2024, 4:33 PM

## 2024-12-24 ENCOUNTER — Other Ambulatory Visit: Payer: Self-pay

## 2024-12-24 DIAGNOSIS — F1290 Cannabis use, unspecified, uncomplicated: Secondary | ICD-10-CM | POA: Diagnosis not present

## 2024-12-24 DIAGNOSIS — F333 Major depressive disorder, recurrent, severe with psychotic symptoms: Secondary | ICD-10-CM | POA: Diagnosis not present

## 2024-12-24 DIAGNOSIS — F1490 Cocaine use, unspecified, uncomplicated: Secondary | ICD-10-CM | POA: Diagnosis not present

## 2024-12-24 DIAGNOSIS — R45851 Suicidal ideations: Secondary | ICD-10-CM | POA: Diagnosis not present

## 2024-12-24 MED ORDER — HYDROCORTISONE 1 % EX CREA: TOPICAL_CREAM | Freq: Two times a day (BID) | CUTANEOUS | Status: AC

## 2024-12-24 MED ORDER — AMLODIPINE BESYLATE 10 MG PO TABS
10.0000 mg | ORAL_TABLET | Freq: Every day | ORAL | 0 refills | Status: AC
  Filled 2024-12-24 – 2025-01-14 (×2): qty 30, 30d supply, fill #0

## 2024-12-24 MED ORDER — GABAPENTIN 100 MG PO CAPS
200.0000 mg | ORAL_CAPSULE | Freq: Three times a day (TID) | ORAL | 0 refills | Status: AC
  Filled 2024-12-24: qty 180, 30d supply, fill #0

## 2024-12-24 MED ORDER — TRAZODONE HCL 150 MG PO TABS
150.0000 mg | ORAL_TABLET | Freq: Every day | ORAL | 0 refills | Status: AC
  Filled 2024-12-24 – 2025-01-14 (×2): qty 30, 30d supply, fill #0

## 2024-12-24 MED ORDER — VITAMIN D (ERGOCALCIFEROL) 1.25 MG (50000 UNIT) PO CAPS
50000.0000 [IU] | ORAL_CAPSULE | ORAL | 0 refills | Status: AC
  Filled 2024-12-24: qty 5, 35d supply, fill #0

## 2024-12-24 MED ORDER — SERTRALINE HCL 100 MG PO TABS
100.0000 mg | ORAL_TABLET | Freq: Every day | ORAL | 0 refills | Status: AC
  Filled 2024-12-24 – 2025-01-14 (×2): qty 30, 30d supply, fill #0

## 2024-12-24 MED ORDER — HYDROXYZINE HCL 50 MG PO TABS
50.0000 mg | ORAL_TABLET | Freq: Three times a day (TID) | ORAL | 0 refills | Status: AC | PRN
  Filled 2024-12-24: qty 90, 30d supply, fill #0

## 2024-12-24 NOTE — Progress Notes (Signed)
(  Sleep Hours) -6.25 (Any PRNs that were needed, meds refused, or side effects to meds)- Vistaril  (Any disturbances and when (visitation, over night)-n/a (Concerns raised by the patient)- none (SI/HI/AVH)- Denies

## 2024-12-24 NOTE — Plan of Care (Signed)

## 2024-12-24 NOTE — Progress Notes (Signed)
 Patient discharged off unit at 1059. Patient belongings reviewed and acknowledged by patient. AVS and Transition Record reviewed and acknowledged by patient. Safety plan completed by patient, reviewed by nurse with patient and copy provided. Any medications and or prescriptions necessary for discharge addressed and provided to patient. Patient denies SI, plan or intent. Denies HI. Denies AVH. No observed or reported side effects to medication. No observed or reported agitation, aggression, or other acute emotional distress. No reported or observed physical abnormalities or concerns. Patient did decline to fill out the Suicide safety plan and was educated on its importance. Patient transportation from facility verified and observed.

## 2024-12-24 NOTE — Discharge Summary (Signed)
 " Physician Discharge Summary Note  Patient:  Anna [name] is an 49 y.o., female MRN:  [id number] DOB:  12-Oct-1976 Patient phone:  [phone] (home)  Patient address:   [address]  [place] [address],  Total Time spent with patient: 45 minutes  Date of Admission:  12/15/2024  Date of Discharge: 12-24-24  Reason for Admission: Worsening suicidal ideations, auditory hallucinations & homelessness.   Principal Problem: Major depressive disorder, recurrent episode, severe, with psychosis (HCC)  Discharge Diagnoses: Principal Problem:   Major depressive disorder, recurrent episode, severe, with psychosis (HCC) Active Problems:   Cocaine use disorder (HCC)   Suicidal ideation   Cannabis use disorder  Past Psychiatric History: Major depressive disorder, recurrent episodes, Cocaine use disorder, Cannabis use disorder.  Past Medical History:  Past Medical History:  Diagnosis Date   Anemia    Anxiety    Hypertension    only elevated at MD office per pt. no meds    Past Surgical History:  Procedure Laterality Date   ANKLE FRACTURE SURGERY Right    HYSTERECTOMY ABDOMINAL WITH SALPINGECTOMY Bilateral 05/22/2021   Procedure: HYSTERECTOMY ABDOMINAL WITH SALPINGECTOMY AND LEFT OOPHORECTOMY;  Surgeon: Lorence Ozell CROME, MD;  Location: [facility];  Service: Gynecology;  Laterality: Bilateral;  TAP BLOCK   Family History:  Family History  Problem Relation Age of Onset   Hypertension Mother    Breast cancer Maternal Aunt 50   Heart failure Maternal Grandfather    Colon cancer Neg Hx    Colon polyps Neg Hx    Esophageal cancer Neg Hx    Rectal cancer Neg Hx    Stomach cancer Neg Hx    Family Psychiatric  History: See H&P.  Social History:  Social History   Substance and Sexual Activity  Alcohol Use Yes   Comment: occassionally, not often     Social History   Substance and Sexual Activity  Drug Use Yes   Types: Cocaine, Marijuana   Comment: occasional cocaine use per  availability; cannabis use 2x weekly    Social History   Socioeconomic History   Marital status: Legally Separated    Spouse name: Not on file   Number of children: 3   Years of education: Not on file   Highest education level: Not on file  Occupational History   Not on file  Tobacco Use   Smoking status: Former    Types: Cigarettes   Smokeless tobacco: Never   Tobacco comments:    pt states quit 1-2 years ago   Vaping Use   Vaping status: Never Used  Substance and Sexual Activity   Alcohol use: Yes    Comment: occassionally, not often   Drug use: Yes    Types: Cocaine, Marijuana    Comment: occasional cocaine use per availability; cannabis use 2x weekly   Sexual activity: Not Currently    Birth control/protection: None  Other Topics Concern   Not on file  Social History Narrative   ** Merged History Encounter **       Social Drivers of Health   Tobacco Use: Medium Risk (12/15/2024)   Patient History    Smoking Tobacco Use: Former    Smokeless Tobacco Use: Never    Passive Exposure: Not on file  Financial Resource Strain: High Risk (11/13/2022)   Overall Financial Resource Strain (CARDIA)    Difficulty of Paying Living Expenses: Very hard  Food Insecurity: Food Insecurity Present (12/15/2024)   Epic    Worried About Running  Out of Food in the Last Year: Sometimes true    Ran Out of Food in the Last Year: Sometimes true  Transportation Needs: Unmet Transportation Needs (12/15/2024)   Epic    Lack of Transportation (Medical): Yes    Lack of Transportation (Non-Medical): Yes  Physical Activity: Insufficiently Active (11/13/2022)   Exercise Vital Sign    Days of Exercise per Week: 4 days    Minutes of Exercise per Session: 30 min  Stress: Stress Concern Present (11/13/2022)   [employer] of Occupational Health - Occupational Stress Questionnaire    Feeling of Stress : Very much  Social Connections: Socially Isolated (11/13/2022)   Social Connection and Isolation  Panel    Frequency of Communication with Friends and Family: Once a week    Frequency of Social Gatherings with Friends and Family: Never    Attends Religious Services: Never    Database Administrator or Organizations: No    Attends Banker Meetings: Never    Marital Status: Separated  Depression (PHQ2-9): Medium Risk (09/10/2023)   Depression (PHQ2-9)    PHQ-2 Score: 9  Alcohol Screen: Medium Risk (12/15/2024)   Alcohol Screen    Last Alcohol Screening Score (AUDIT): 8  Housing: High Risk (12/15/2024)   Epic    Unable to Pay for Housing in the Last Year: Yes    Number of Times Moved in the Last Year: 3    Homeless in the Last Year: Yes  Utilities: At Risk (12/15/2024)   Epic    Threatened with loss of utilities: Yes  Health Literacy: Not on file   Hospital Course: (Per admission evaluation notes): 49 year old AA female with hx of major depressive disorder, anxiety disorder, cocaine use disorder & cannabis use disorder. She has been admitted & treated in this [facility] with complaint of homelessness, worsening suicidal ideations in 2023. At the time, after mood stabilization treatments, she was recommended to follow-up at the [facility]  for routine psychiatric care & medication management. Patient is being re-admitted to the [facility] this time with complaint of worsening suicidal ideations, auditory hallucinations & homelessness. A review of her current lab results has shown her UDS was positive for [facility] & cocaine. 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   Upon the decision by the treatment team to discharge [name] today, she was seen & evaluated  by her treatment team for mood stability. The current laboratory findings were reviewed, stable. The nurses notes & vital signs were reviewed as well. All are stable. At this present time, there are no current mental health or medical issues that should prevent this discharge at this time. Patient is being discharged to continue mental  health care & medication management as noted below. She is also aware & agreeable to this discharge.  This is [name]'s second psychiatric admission/discharge summary from this [facility]. She was admitted this time around with worsening suicidal ideations, auditory hallucinations & homelessness. She was recommended for mood stabilization treatments by her treatment team after her admission evaluation. And with her consent, Millianna was treated, stabilized & discharged on the medications as listed below on her discharge medication lists. She was also enrolled & participated in the group counseling sessions being offered & held on this unit. She learned coping skills. She presented other significant pre-existing medical conditions that required treatment or monitoring. She was treated & discharged on all the pertinent medications used for those pre-existing health issues. She tolerated her treatment regimen without  any adverse effects or reactions reported. Lawrie's symptoms responded well to her treatment regimen without any reported side effects or adverse reactions. She is currently mentally & medically stable to be discharged to continue further mental health care/medication management as noted below. She is agreeable that this discharge is warranted.   During the course of her hospitalization, the 15-minute checks were adequate to ensure Kerensa's safety. Patient did not display any dangerous, violent or suicidal behavior on the unit.  She interacted with the  other patients & staff appropriately. She participated appropriately in the group sessions/therapies. Her medications were addressed & adjusted to meet her needs. She was recommended for outpatient follow-up care & medication management upon discharge to assure her continuity of care.  At the time of discharge, patient is not reporting any acute suicidal/homicidal ideations. She feels more confident about her self & mental health care. She currently denies  any new issues or concerns as homelessness was her major concerns/needs. She did decline to go to any substance abuse treatment program for her substance abuse/addiction issues. She stated that homelessness has always been a trigger for her substance use or abuse . She states that this was the only way to deal with the struggles of street life. Education and supportive counseling provided throughout her hospital stay & upon discharge. At last, Laina's mother was contacted by the social & she did agree for Guynell to return to her (mother's home) after discharge.   Today upon her discharge evaluation with her treatment team, Sandrika shares she is doing well. She denies any other specific concerns. She is sleeping well. Her appetite is good. She denies other physical complaints. She denies AH/VH, delusional thoughts or paranoia. She does not appear to be responding to any internal stimuli. She feels that her medications have been helpful & is in agreement to continue her current treatment regimen as recommended. She was able to engage in safety planning including plan to return to [facility] or contact emergency services if she feels unable to maintain her own safety or the safety of others. Pt had no further questions, comments, or concerns. She left [facility] with all personal belongings in no apparent distress. Transportation per a taxi cab. [facility] assisted with the taxi fare.  Physical Findings: AIMS:  , ,  ,  ,  ,  ,   CIWA:    COWS:     Musculoskeletal: Strength & Muscle Tone: within normal limits Gait & Station: normal Patient leans: N/A   Psychiatric Specialty Exam:  Presentation  General Appearance:  Casual; Fairly Groomed  Eye Contact: Good  Speech: Clear and Coherent; Normal Rate  Speech Volume: Normal  Handedness: Right   Mood and Affect  Mood: Euthymic  Affect: Congruent; Appropriate   Thought Process  Thought Processes: Coherent; Goal Directed; Linear  Descriptions of  Associations:Intact  Orientation:Full (Time, Place and Person)  Thought Content:Logical  History of Schizophrenia/Schizoaffective disorder:No  Duration of Psychotic Symptoms:N/A  Hallucinations:Hallucinations: None Description of Auditory Hallucinations: NA  Ideas of Reference:None  Suicidal Thoughts:Suicidal Thoughts: No  Homicidal Thoughts:Homicidal Thoughts: No   Sensorium  Memory: Immediate Good; Recent Good; Remote Good  Judgment: Fair  Insight: Fair   Art Therapist  Concentration: Good  Attention Span: Good  Recall: Good  Fund of Knowledge: Fair  Language: Good   Psychomotor Activity  Psychomotor Activity: Psychomotor Activity: Normal   Assets  Assets: Communication Skills; Desire for Improvement; Housing; Resilience; Physical Health; Social Support   Sleep  Sleep: Sleep: Good  Estimated Sleeping Duration (Last 24 Hours): 6.00-6.50 hours   Physical Exam: Physical Exam Vitals and nursing note reviewed.  HENT:     Head: Normocephalic.     Nose: Nose normal.     Mouth/Throat:     Pharynx: Oropharynx is clear.  Eyes:     Pupils: Pupils are equal, round, and reactive to light.  Cardiovascular:     Rate and Rhythm: Normal rate.     Pulses: Normal pulses.  Pulmonary:     Effort: Pulmonary effort is normal.  Genitourinary:    Comments: Deferred. Musculoskeletal:        General: Normal range of motion.     Cervical back: Normal range of motion.  Skin:    General: Skin is dry.  Neurological:     General: No focal deficit present.     Mental Status: She is alert and oriented to person, place, and time. Mental status is at baseline.    Review of Systems  Constitutional:  Negative for chills, diaphoresis and fever.  HENT:  Negative for congestion and sore throat.   Eyes:  Negative for blurred vision.  Respiratory:  Negative for cough, shortness of breath and wheezing.   Cardiovascular:  Negative for chest pain and  palpitations.       Hx. HTN: Stable. Pt on amlodipine  10 daily.  Gastrointestinal:  Negative for abdominal pain, constipation, diarrhea, heartburn, nausea and vomiting.  Genitourinary:  Negative for urgency.  Musculoskeletal:  Negative for joint pain and myalgias.  Skin:  Negative for itching.  Neurological:  Negative for dizziness, tingling, tremors, sensory change, speech change, focal weakness, seizures, loss of consciousness, weakness and headaches.  Endo/Heme/Allergies:        Allergies: Peanuts.   Other: Latex.  Psychiatric/Behavioral:  Positive for depression (Hx of. Stable on medication.) and substance abuse (Hx. alcohol, cocaine & THC use disorders.). Negative for hallucinations and suicidal ideas. The patient has insomnia (Hx of (stable on medication).). The patient is not nervous/anxious (Stable upon dicharge.).    Blood pressure 122/84, pulse 72, temperature 97.6 F (36.4 C), temperature source Oral, resp. rate 18, height 5' 7 (1.702 m), weight 76.2 kg, last menstrual period 04/12/2021, SpO2 100%. Body mass index is 26.31 kg/m.  Tobacco Use History[1] Tobacco Cessation:  N/A, patient does not currently use tobacco products  Blood Alcohol level:  Lab Results  Component Value Date   ETH 278 (H) 07/23/2024   ETH 49 (H) 09/12/2022   Metabolic Disorder Labs:  Lab Results  Component Value Date   HGBA1C 5.2 12/21/2024   MPG 102.54 12/21/2024   MPG 99.67 09/12/2022   Lab Results  Component Value Date   PROLACTIN 5.6 05/29/2022   Lab Results  Component Value Date   CHOL 199 12/21/2024   TRIG 78 12/21/2024   HDL 105 12/21/2024   CHOLHDL 1.9 12/21/2024   VLDL 16 12/21/2024   LDLCALC 78 12/21/2024   LDLCALC 101 (H) 09/10/2023   See Psychiatric Specialty Exam and Suicide Risk Assessment completed by Attending Physician prior to discharge.  Discharge destination:  Home  Is patient on multiple antipsychotic therapies at discharge:  No   Has Patient had three or more  failed trials of antipsychotic monotherapy by history:  No  Recommended Plan for Multiple Antipsychotic Therapies: NA   Allergies as of 12/24/2024       Reactions   Latex Hives, Swelling   Peanut-containing Drug Products Itching        Medication List  TAKE these medications      Indication  amLODipine  10 MG tablet Commonly known as: NORVASC  Take 1 tablet (10 mg total) by mouth daily. For hypertension. Start taking on: December 25, 2024 What changed:  medication strength how much to take additional instructions  Indication: High Blood Pressure   gabapentin  100 MG capsule Commonly known as: NEURONTIN  Take 2 capsules (200 mg total) by mouth 3 (three) times daily. For anxiety  Indication: Substance withdrawal syndrome (agitation/anxiety).   hydrocortisone  cream 1 % Apply topically 2 (two) times daily. For itching  Indication: Itching   hydrOXYzine  50 MG tablet Commonly known as: ATARAX  Take 1 tablet (50 mg total) by mouth 3 (three) times daily as needed for anxiety.  Indication: Feeling Anxious   sertraline  100 MG tablet Commonly known as: ZOLOFT  Take 1 tablet (100 mg total) by mouth daily. For depression. What changed: additional instructions  Indication: Major Depressive Disorder   traZODone  150 MG tablet Commonly known as: DESYREL  Take 1 tablet (150 mg total) by mouth at bedtime. For sleep. What changed:  when to take this reasons to take this additional instructions  Indication: Trouble Sleeping, insomnia   Vitamin D  (Ergocalciferol ) 1.25 MG (50000 UNIT) Caps capsule Commonly known as: DRISDOL  Take 1 capsule (50,000 Units total) by mouth every 7 (seven) days. For bone health. Start taking on: December 28, 2024  Indication: Vitamin D  Deficiency        Follow-up Information     [facility] Follow up.   Why: You may also call or go to this provider.  Please arrive anytime between 8 am to 1 pm, M-Thursday for an in-person assessment and follow-up visit  for medication management and/or therapy services. Contact information: [address]  [address] [place] [address] [phone]         [facility]. Go on 12/27/2024.   Specialty: Behavioral Health Why: You have an appointment on 1/19 at 2:00 pm for medication management services, in person .  You also have an appointment on 01/11/25 at 3:00 pm, Virtual, for therapy services. Contact information: [address] [place]  [address] [phone]               Plan Of Care/Follow-up recommendations:  Activity: as tolerated  Diet: heart healthy  Other: -Follow-up with your outpatient psychiatric provider -instructions on appointment date, time, and address (location) are provided to you in discharge paperwork.  -Take your psychiatric medications as prescribed at discharge - instructions are provided to you in the discharge paperwork  -Follow-up with outpatient primary care doctor and other specialists -for management of preventative medicine and chronic medical issues  -Testing: Follow-up with outpatient provider for abnormal lab results: NA  -If you are prescribed an atypical antipsychotic medication, we recommend that your outpatient psychiatrist follow routine screening for side effects within 3 months of discharge, including monitoring: AIMS scale, height, weight, blood pressure, fasting lipid panel, HbA1c, and fasting blood sugar.   -Recommend total abstinence from alcohol, tobacco, and other illicit drug use at discharge.   -If your psychiatric symptoms recur, worsen, or if you have side effects to your psychiatric medications, call your outpatient psychiatric provider, 911, [phone] or go to the nearest emergency department.  -If suicidal thoughts occur, immediately call your outpatient psychiatric provider, 911, 988 or go to the nearest emergency department.  Signed: Mac Bolster, NP 12/24/2024, 11:06 AM           [1]   Social History Tobacco Use  Smoking Status Former   Types: Cigarettes  Smokeless Tobacco Never  Tobacco Comments   pt states quit 1-2 years ago    "

## 2024-12-24 NOTE — Progress Notes (Signed)
" °  [facility] Adult Case Management Discharge Plan :  Will you be returning to the same living situation after discharge:  Yes,  Pt will return home with her mother; she was previously homeless, however she is pending two possible admissions to [facility] or [facility] (shelter for women). At discharge, do you have transportation home?: Yes,  Pt will be given taxi voucher to her mother's home. Do you have the ability to pay for your medications: Yes,  Pt has medical insurance.  Release of information consent forms completed and in the chart;  Patient's signature needed at discharge.  Patient to Follow up at:  Follow-up Information     [facility] Follow up.   Why: You may also call or go to this provider.  Please arrive anytime between 8 am to 1 pm, M-Thursday for an in-person assessment and follow-up visit for medication management and/or therapy services. Contact information: [address]  [address] [place] [address] [phone]         [facility]. Go on 12/27/2024.   Specialty: Behavioral Health Why: You have an appointment on 1/19 at 2:00 pm for medication management services, in person .  You also have an appointment on 01/11/25 at 3:00 pm, Virtual, for therapy services. Contact information: [address] [place]  [id number] [phone]                Next level of care provider has access to [facility] Link:no  Safety Planning and Suicide Prevention discussed: Yes,  Completed with mother, Juanita Davis.     Has patient been referred to the Quitline?: Patient does not use tobacco/nicotine products  Patient has been referred for addiction treatment: No known substance use disorder.  Derick JONELLE Blanch, LCSW 12/24/2024, 8:45 AM "

## 2024-12-24 NOTE — [facility] Suicide Risk Assessment (Signed)
 Suicide Risk Assessment  Discharge Assessment    [facility] Discharge Suicide Risk Assessment   Principal Problem: Major depressive disorder, recurrent episode, severe, with psychosis (HCC)  Discharge Diagnoses: Principal Problem:   Major depressive disorder, recurrent episode, severe, with psychosis (HCC) Active Problems:   Cocaine use disorder (HCC)   Suicidal ideation   Cannabis use disorder  Total Time spent with patient: 15 minutes  Musculoskeletal: Strength & Muscle Tone: within normal limits Gait & Station: normal Patient leans: N/A  Psychiatric Specialty Exam  Presentation  General Appearance:  Casual; Fairly Groomed  Eye Contact: Good  Speech: Clear and Coherent; Normal Rate  Speech Volume: Normal  Handedness: Right   Mood and Affect  Mood: Euthymic  Duration of Depression Symptoms: Greater than two weeks  Affect: Congruent; Appropriate   Thought Process  Thought Processes: Coherent; Goal Directed; Linear  Descriptions of Associations:Intact  Orientation:Full (Time, Place and Person)  Thought Content:Logical  History of Schizophrenia/Schizoaffective disorder:No  Duration of Psychotic Symptoms:N/A  Hallucinations:Hallucinations: None Description of Auditory Hallucinations: NA  Ideas of Reference:None  Suicidal Thoughts:Suicidal Thoughts: No  Homicidal Thoughts:Homicidal Thoughts: No  Sensorium  Memory: Immediate Good; Recent Good; Remote Good  Judgment: Fair  Insight: Fair  Art Therapist  Concentration: Good  Attention Span: Good  Recall: Good  Fund of Knowledge: Fair  Language: Good   Psychomotor Activity  Psychomotor Activity: Psychomotor Activity: Normal  Assets  Assets: Communication Skills; Desire for Improvement; Housing; Resilience; Physical Health; Social Support   Sleep  Sleep: Sleep: Good  Estimated Sleeping Duration (Last 24 Hours): 6.00-6.50 hours  Physical Exam: See H&P.  Blood  pressure 122/84, pulse 72, temperature 97.6 F (36.4 C), temperature source Oral, resp. rate 18, height 5' 7 (1.702 m), weight 76.2 kg, last menstrual period 04/12/2021, SpO2 100%. Body mass index is 26.31 kg/m.  Mental Status Per Nursing Assessment::   On Admission:  NA  Demographic Factors:  Adolescent or young adult, Low socioeconomic status, and Unemployed  Loss Factors: Financial problems/change in socioeconomic status and homelessness.  Historical Factors: NA  Risk Reduction Factors:   Sense of responsibility to family, Living with another person, especially a relative, Positive social support, Positive therapeutic relationship, and Positive coping skills or problem solving skills  Continued Clinical Symptoms:  Depression:   Comorbid alcohol abuse/dependence Alcohol/Substance Abuse/Dependencies More than one psychiatric diagnosis Unstable or Poor Therapeutic Relationship Previous Psychiatric Diagnoses and Treatments  Cognitive Features That Contribute To Risk:  Polarized thinking and Thought constriction (tunnel vision)    Suicide Risk:  Minimal: No identifiable suicidal ideation.  Patients presenting with no risk factors but with morbid ruminations; may be classified as minimal risk based on the severity of the depressive symptoms   Follow-up Information     [facility] Follow up.   Why: You may also call or go to this provider.  Please arrive anytime between 8 am to 1 pm, M-Thursday for an in-person assessment and follow-up visit for medication management and/or therapy services. Contact information: [address]  [address] [place] [address] [phone]         [facility]. Go on 12/27/2024.   Specialty: Behavioral Health Why: You have an appointment on 1/19 at 2:00 pm for medication management services, in person .  You also have an appointment on 01/11/25 at 3:00 pm, Virtual, for therapy services. Contact information: [address]  [address] [place]  [phone]  Plan Of Care/Follow-up recommendations:  See the discharge recommendation above.  Mac Bolster, NP, [id number]. 12/24/2024, 9:41 AM

## 2024-12-24 NOTE — Group Note (Signed)
 Date:  12/24/2024 Time:  11:06 AM  Group Topic/Focus: Goals and orientation Goals Group:   The focus of this group is to help patients establish daily goals to achieve during treatment and discuss how the patient can incorporate goal setting into their daily lives to aide in recovery. Orientation:   The focus of this group is to educate the patient on the purpose and policies of crisis stabilization and provide a format to answer questions about their admission.  The group details unit policies and expectations of patients while admitted.    Participation Level:  Did Not Attend  Anna [name] 12/24/2024, 11:06 AM

## 2024-12-27 ENCOUNTER — Ambulatory Visit (HOSPITAL_COMMUNITY): Payer: MEDICAID | Admitting: Psychiatry

## 2025-01-05 ENCOUNTER — Other Ambulatory Visit: Payer: Self-pay

## 2025-01-07 ENCOUNTER — Other Ambulatory Visit: Payer: Self-pay

## 2025-01-11 ENCOUNTER — Telehealth (HOSPITAL_COMMUNITY): Payer: Self-pay

## 2025-01-11 ENCOUNTER — Ambulatory Visit (HOSPITAL_COMMUNITY): Payer: MEDICAID

## 2025-01-11 NOTE — Telephone Encounter (Signed)
 Called twice and sent text. No [email or web address]

## 2025-01-14 ENCOUNTER — Ambulatory Visit: Payer: MEDICAID | Admitting: Nurse Practitioner

## 2025-01-14 ENCOUNTER — Other Ambulatory Visit: Payer: Self-pay
